# Patient Record
Sex: Female | Born: 1945 | Hispanic: Yes | Smoking: Never smoker
Health system: Southern US, Community
[De-identification: ages and names within clinical notes are randomized; demographics above are authoritative.]

## PROBLEM LIST (undated history)

## (undated) DIAGNOSIS — H9190 Unspecified hearing loss, unspecified ear: Secondary | ICD-10-CM

## (undated) DIAGNOSIS — G40909 Epilepsy, unspecified, not intractable, without status epilepticus: Secondary | ICD-10-CM

## (undated) DIAGNOSIS — M1990 Unspecified osteoarthritis, unspecified site: Secondary | ICD-10-CM

## (undated) DIAGNOSIS — I10 Essential (primary) hypertension: Secondary | ICD-10-CM

## (undated) DIAGNOSIS — M545 Low back pain, unspecified: Secondary | ICD-10-CM

## (undated) DIAGNOSIS — R519 Headache, unspecified: Secondary | ICD-10-CM

## (undated) DIAGNOSIS — E669 Obesity, unspecified: Secondary | ICD-10-CM

## (undated) DIAGNOSIS — E7800 Pure hypercholesterolemia, unspecified: Secondary | ICD-10-CM

## (undated) DIAGNOSIS — E119 Type 2 diabetes mellitus without complications: Secondary | ICD-10-CM

## (undated) DIAGNOSIS — R42 Dizziness and giddiness: Secondary | ICD-10-CM

## (undated) DIAGNOSIS — N289 Disorder of kidney and ureter, unspecified: Secondary | ICD-10-CM

## (undated) HISTORY — DX: Unspecified hearing loss, unspecified ear: H91.90

## (undated) HISTORY — DX: Low back pain, unspecified: M54.50

## (undated) HISTORY — DX: Epilepsy, unspecified, not intractable, without status epilepticus: G40.909

## (undated) HISTORY — PX: SHOULDER SURGERY: SHX246

## (undated) HISTORY — DX: Type 2 diabetes mellitus without complications: E11.9

## (undated) HISTORY — DX: Dizziness and giddiness: R42

## (undated) HISTORY — DX: Headache, unspecified: R51.9

## (undated) HISTORY — DX: Disorder of kidney and ureter, unspecified: N28.9

## (undated) HISTORY — DX: Unspecified osteoarthritis, unspecified site: M19.90

## (undated) HISTORY — PX: BACK SURGERY: SHX140

## (undated) HISTORY — PX: LAPAROSCOPIC HYSTERECTOMY: SHX1926

## (undated) HISTORY — DX: Essential (primary) hypertension: I10

## (undated) HISTORY — PX: HAND SURGERY: SHX662

## (undated) HISTORY — DX: Obesity, unspecified: E66.9

## (undated) HISTORY — DX: Pure hypercholesterolemia, unspecified: E78.00

---

## 1982-05-27 HISTORY — PX: BRAIN TUMOR EXCISION: SHX577

## 2019-11-12 ENCOUNTER — Telehealth: Payer: Self-pay

## 2019-11-12 NOTE — Telephone Encounter (Signed)
NOTES ON FILE FROM [facility] [phone]. SENT TO SCHEDULING.

## 2019-12-20 ENCOUNTER — Ambulatory Visit (INDEPENDENT_AMBULATORY_CARE_PROVIDER_SITE_OTHER): Payer: Medicare Other | Admitting: Cardiology

## 2019-12-20 ENCOUNTER — Encounter: Payer: Self-pay | Admitting: Cardiology

## 2019-12-20 ENCOUNTER — Other Ambulatory Visit: Payer: Self-pay

## 2019-12-20 ENCOUNTER — Encounter: Payer: Self-pay | Admitting: *Deleted

## 2019-12-20 VITALS — BP 150/62 | HR 61 | Ht 59.0 in | Wt 175.0 lb

## 2019-12-20 DIAGNOSIS — N182 Chronic kidney disease, stage 2 (mild): Secondary | ICD-10-CM

## 2019-12-20 DIAGNOSIS — I2510 Atherosclerotic heart disease of native coronary artery without angina pectoris: Secondary | ICD-10-CM

## 2019-12-20 DIAGNOSIS — E119 Type 2 diabetes mellitus without complications: Secondary | ICD-10-CM

## 2019-12-20 DIAGNOSIS — I10 Essential (primary) hypertension: Secondary | ICD-10-CM

## 2019-12-20 DIAGNOSIS — I209 Angina pectoris, unspecified: Secondary | ICD-10-CM | POA: Diagnosis not present

## 2019-12-20 DIAGNOSIS — R0600 Dyspnea, unspecified: Secondary | ICD-10-CM | POA: Diagnosis not present

## 2019-12-20 DIAGNOSIS — R0609 Other forms of dyspnea: Secondary | ICD-10-CM

## 2019-12-20 NOTE — Progress Notes (Signed)
Cardiology Office Note:    Date:  12/20/2019   ID:  [name], DOB [birth year], MRN [id number]  PCP:  Patient, No Pcp Per  [facility] Cardiologist:  [name], MD  [facility] Electrophysiologist:  None   Referring MD: [name], PA     History of Present Illness:    Carolyn Little is a 74 y.o. female here for the evaluation of hypertension at the request of Carolyn Little, [place].  Been feeling short of breath with activity.  This could be her prior anginal equivalent.  Also has low back pain headaches.  She was requesting at her primary care provider visit kidney specialist ear doctor and cardiologist. Saw in [place].   CAD with stent 2015. Since then some angina, hospital. Moderate SOB with activity.  Does not recall having a cardiac catheterization after her stent placement.  She takes combination hydrochlorothiazide valsartan 25 320.  Also takes amlodipine.  Denies any fevers chills nausea vomiting syncope bleeding.  She is here with her sister.  Spanish interpreter present.  TSH 2.5 LDL 122 triglycerides 173 hemoglobin A1c 8.6 creatinine 1.1 potassium 4.9 sodium 141  Past Medical History:  Diagnosis Date  . Arthritis   . Diabetes ([place])   . Dizziness   . DM (diabetes mellitus) ([place])   . Generalized headaches   . Hearing loss   . High cholesterol   . Hypertension   . Low back pain   . Obesity   . Renal insufficiency   . Seizure disorder ([place])       Current Medications: Current Meds  Medication Sig  . amLODipine (NORVASC) 5 MG tablet Take 5 mg by mouth daily.  [name] aspirin EC 81 MG tablet Take 81 mg by mouth daily. Swallow whole.  . brimonidine (ALPHAGAN) 0.2 % ophthalmic solution 3 (three) times daily.  . carvedilol (COREG) 25 MG tablet Take 25 mg by mouth 2 (two) times daily with a meal.  . dorzolamidel-timolol (COSOPT) 22.3-6.8 MG/ML SOLN ophthalmic solution 1 drop 2 (two) times daily.  . famotidine (PEPCID) 40 MG tablet Take 40 mg by mouth  daily.  . insulin degludec (TRESIBA FLEXTOUCH) 200 UNIT/ML FlexTouch Pen Inject into the skin.  [name] lamoTRIgine (LAMICTAL) 150 MG tablet Take 150 mg by mouth 2 (two) times daily.   [name] levETIRAcetam (KEPPRA) 500 MG tablet Take 500 mg by mouth 2 (two) times daily.  . meclizine (ANTIVERT) 25 MG tablet Take 25 mg by mouth 3 (three) times daily as needed for dizziness.  . simvastatin (ZOCOR) 40 MG tablet Take 40 mg by mouth daily.  . valsartan-hydrochlorothiazide (DIOVAN-HCT) 320-25 MG tablet Take 1 tablet by mouth daily.     Allergies:   Levaquin [levofloxacin]   Social History   Socioeconomic History  . Marital status: Single    Spouse name: Not on file  . Little of children: Not on file  . Years of education: Not on file  . Highest education level: Not on file  Occupational History  . Not on file  Tobacco Use  . Smoking status: Never Smoker  . Smokeless tobacco: Never Used  Substance and Sexual Activity  . Alcohol use: Not Currently  . Drug use: Never  . Sexual activity: Not on file  Other Topics Concern  . Not on file  Social History Narrative  . Not on file   Social Determinants of Health   Financial Resource Strain:   . Difficulty of Paying Living Expenses:   Food Insecurity:   .  Worried About Charity fundraiser in the Last Year:   . Arboriculturist in the Last Year:   Transportation Needs:   . Film/video editor (Medical):   [name] Lack of Transportation (Non-Medical):   Physical Activity:   . Days of Exercise per Week:   . Minutes of Exercise per Session:   Stress:   . Feeling of Stress :   Social Connections:   . Frequency of Communication with Friends and Family:   . Frequency of Social Gatherings with Friends and Family:   . Attends Religious Services:   . Active Member of Clubs or Organizations:   . Attends Archivist Meetings:   [name] Marital Status:      Family History: The patient's sister may have died from something heart related, grandfather  as well.  ROS:   Please see the history of present illness.     All other systems reviewed and are negative.  EKGs/Labs/Other Studies Reviewed:     EKG:  EKG is  ordered today.  The ekg ordered today demonstrates sinus rhythm 61 nonspecific ST-T wave changes noted, T wave inversion noted in V6 and V5, J-point elevation in V3.  Recent Labs: No results found for requested labs within last 8760 hours.  Recent Lipid Panel No results found for: CHOL, TRIG, HDL, CHOLHDL, VLDL, LDLCALC, LDLDIRECT  Physical Exam:    VS:  BP (!) 150/62   Pulse 61   Ht 4\' 11"  (1.499 m)   Wt 175 lb (79.4 kg)   BMI 35.35 kg/m     Wt Readings from Last 3 Encounters:  12/20/19 175 lb (79.4 kg)     GEN:  Well nourished, well developed in no acute distress HEENT: Normal NECK: No JVD; No carotid bruits LYMPHATICS: No lymphadenopathy CARDIAC: RRR, no murmurs, rubs, gallops RESPIRATORY:  Clear to auscultation without rales, wheezing or rhonchi  ABDOMEN: Soft, non-tender, non-distended MUSCULOSKELETAL:  No edema; No deformity  SKIN: Warm and dry NEUROLOGIC:  Alert and oriented x 3 PSYCHIATRIC:  Normal affect   ASSESSMENT:    1. Coronary artery disease involving native coronary artery of native heart without angina pectoris   2. Diabetes mellitus with coincident hypertension ([place])   3. Chronic kidney disease, stage II (mild)   4. Dyspnea on exertion   5. Angina pectoris ([place])    PLAN:    In order of problems listed above:  Coronary artery disease -Prior stent placement in approximately 2015.  She is currently having some increasing shortness of breath with activity, walking uphill.  We will go ahead and check an echocardiogram as well as Lexiscan stress test to ensure that this is not her angina returning.  She has not had a catheterization post 2015.  She has a stent card at home.  Her daughter who was present at today's visit would like to be called with results.  Abnormal EKG -T wave inversions  noted in V5 and V6 could be LVH repolarization abnormality.  Checking stress test, echocardiogram  Essential hypertension -Blood pressure was 114/65 at last office visit with Carolyn Number, PA on 11/12/2019.  Well-controlled.  Here was elevated.  Likely some degree of whitecoat hypertension.  Continue to monitor.  States that she takes amlodipine as needed when she feels her high blood pressure.  Mixed hyperlipidemia -Continuing with current treatment strategy.  No changes made.  On simvastatin 40 mg a day.  No myalgias.  Chronic kidney disease stage II -She was referred to nephrology.  She has seen nephrology in [place].  Diabetes with hypertension -Continuing per primary care.  Hemoglobin A1c still a little bit elevated.   Medication Adjustments/Labs and Tests Ordered: Current medicines are reviewed at length with the patient today.  Concerns regarding medicines are outlined above.  Orders Placed This Encounter  Procedures  . MYOCARDIAL PERFUSION IMAGING  . EKG 12-Lead  . ECHOCARDIOGRAM COMPLETE   No orders of the defined types were placed in this encounter.   Patient Instructions  Medication Instructions:  The current medical regimen is effective;  continue present plan and medications.  *If you need a refill on your cardiac medications before your next appointment, please call your pharmacy*  Testing/Procedures: Your physician has requested that you have an echocardiogram. Echocardiography is a painless test that uses sound waves to create images of your heart. It provides your doctor with information about the size and shape of your heart and how well your heart's chambers and valves are working. This procedure takes approximately one hour. There are no restrictions for this procedure.  Your physician has requested that you have a lexiscan myoview. For further information please visit [email or web address]. Please follow instruction sheet, as given.  Follow-Up: At [facility], you and your health needs are our priority.  As part of our continuing mission to provide you with exceptional heart care, we have created designated Provider Care Teams.  These Care Teams include your primary Cardiologist (physician) and Advanced Practice Providers (APPs -  Physician Assistants and Nurse Practitioners) who all work together to provide you with the care you need, when you need it.  We recommend signing up for the patient portal called "MyChart".  Sign up information is provided on this After Visit Summary.  MyChart is used to connect with patients for Virtual Visits (Telemedicine).  Patients are able to view lab/test results, encounter notes, upcoming appointments, etc.  Non-urgent messages can be sent to your provider as well.   To learn more about what you can do with MyChart, go to [email or web address].    Your next appointment:   12 month(s)  The format for your next appointment:   In Person  Provider:   [name], MD   Thank you for choosing [facility]!!         Signed, [name], MD  12/20/2019 11:17 AM    [place]

## 2019-12-20 NOTE — Patient Instructions (Addendum)
Medication Instructions:  The current medical regimen is effective;  continue present plan and medications.  *If you need a refill on your cardiac medications before your next appointment, please call your pharmacy*  Testing/Procedures: Your physician has requested that you have an echocardiogram. Echocardiography is a painless test that uses sound waves to create images of your heart. It provides your doctor with information about the size and shape of your heart and how well your heart's chambers and valves are working. This procedure takes approximately one hour. There are no restrictions for this procedure.  Your physician has requested that you have a lexiscan myoview. For further information please visit [email or web address]. Please follow instruction sheet, as given.  Follow-Up: At [facility], you and your health needs are our priority.  As part of our continuing mission to provide you with exceptional heart care, we have created designated Provider Care Teams.  These Care Teams include your primary Cardiologist (physician) and Advanced Practice Providers (APPs -  Physician Assistants and Nurse Practitioners) who all work together to provide you with the care you need, when you need it.  We recommend signing up for the patient portal called "MyChart".  Sign up information is provided on this After Visit Summary.  MyChart is used to connect with patients for Virtual Visits (Telemedicine).  Patients are able to view lab/test results, encounter notes, upcoming appointments, etc.  Non-urgent messages can be sent to your provider as well.   To learn more about what you can do with MyChart, go to [email or web address].    Your next appointment:   12 month(s)  The format for your next appointment:   In Person  Provider:   [name], MD   Thank you for choosing [facility]!!

## 2020-01-03 ENCOUNTER — Telehealth (HOSPITAL_COMMUNITY): Payer: Self-pay | Admitting: *Deleted

## 2020-01-03 NOTE — Telephone Encounter (Signed)
Via interpreter, left message on voicemail per DPR in reference to upcoming appointment scheduled on 01/07/20 at 10:15 with detailed instructions given per Myocardial Perfusion Study Information Sheet for the test. LM to arrive 15 minutes early, and that it is imperative to arrive on time for appointment to keep from having the test rescheduled. If you need to cancel or reschedule your appointment, please call the office within 24 hours of your appointment. Failure to do so may result in a cancellation of your appointment, and a $50 no show fee. Phone number given for call back for any questions.

## 2020-01-07 ENCOUNTER — Ambulatory Visit (HOSPITAL_BASED_OUTPATIENT_CLINIC_OR_DEPARTMENT_OTHER): Payer: Medicare Other

## 2020-01-07 ENCOUNTER — Other Ambulatory Visit: Payer: Self-pay

## 2020-01-07 ENCOUNTER — Ambulatory Visit (HOSPITAL_COMMUNITY): Payer: Medicare Other | Attending: Cardiology

## 2020-01-07 DIAGNOSIS — I209 Angina pectoris, unspecified: Secondary | ICD-10-CM | POA: Diagnosis present

## 2020-01-07 DIAGNOSIS — R0609 Other forms of dyspnea: Secondary | ICD-10-CM

## 2020-01-07 DIAGNOSIS — R0600 Dyspnea, unspecified: Secondary | ICD-10-CM

## 2020-01-07 LAB — MYOCARDIAL PERFUSION IMAGING
LV dias vol: 78 mL (ref 46–106)
LV sys vol: 32 mL
Peak HR: 65 {beats}/min
Rest HR: 55 {beats}/min
SDS: 0
SRS: 0
SSS: 0
TID: 1.16

## 2020-01-07 LAB — ECHOCARDIOGRAM COMPLETE
Area-P 1/2: 2.24 cm2
Height: 59 in
S' Lateral: 3 cm
Weight: 2800 oz

## 2020-01-07 MED ORDER — TECHNETIUM TC 99M TETROFOSMIN IV KIT
10.1000 | PACK | Freq: Once | INTRAVENOUS | Status: AC | PRN
  Administered 2020-01-07: 10.1 via INTRAVENOUS
  Filled 2020-01-07: qty 11

## 2020-01-07 MED ORDER — TECHNETIUM TC 99M TETROFOSMIN IV KIT
31.5000 | PACK | Freq: Once | INTRAVENOUS | Status: AC | PRN
  Administered 2020-01-07: 31.5 via INTRAVENOUS
  Filled 2020-01-07: qty 32

## 2020-01-07 MED ORDER — ADENOSINE (DIAGNOSTIC) 3 MG/ML IV SOLN
0.5600 mg/kg | Freq: Once | INTRAVENOUS | Status: AC
  Administered 2020-01-07: 44.4 mg via INTRAVENOUS

## 2020-01-07 NOTE — Progress Notes (Signed)
2D Echocardiogram has been completed.  Rut Betterton, RCS 

## 2020-01-13 ENCOUNTER — Ambulatory Visit: Payer: Medicare Other | Admitting: Neurology

## 2020-01-27 ENCOUNTER — Encounter: Payer: Self-pay | Admitting: *Deleted

## 2020-02-01 ENCOUNTER — Other Ambulatory Visit: Payer: Self-pay

## 2020-02-01 ENCOUNTER — Ambulatory Visit (INDEPENDENT_AMBULATORY_CARE_PROVIDER_SITE_OTHER): Payer: Medicare Other | Admitting: Diagnostic Neuroimaging

## 2020-02-01 ENCOUNTER — Encounter: Payer: Self-pay | Admitting: Diagnostic Neuroimaging

## 2020-02-01 VITALS — BP 180/75 | HR 69 | Ht 59.0 in | Wt 176.0 lb

## 2020-02-01 DIAGNOSIS — G40109 Localization-related (focal) (partial) symptomatic epilepsy and epileptic syndromes with simple partial seizures, not intractable, without status epilepticus: Secondary | ICD-10-CM | POA: Diagnosis not present

## 2020-02-01 MED ORDER — LEVETIRACETAM 500 MG PO TABS
500.0000 mg | ORAL_TABLET | Freq: Two times a day (BID) | ORAL | 4 refills | Status: DC
Start: 2020-02-01 — End: 2021-04-23

## 2020-02-01 MED ORDER — LAMOTRIGINE 150 MG PO TABS
150.0000 mg | ORAL_TABLET | Freq: Two times a day (BID) | ORAL | 4 refills | Status: DC
Start: 2020-02-01 — End: 2021-02-05

## 2020-02-01 NOTE — Patient Instructions (Signed)
  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

## 2020-02-01 NOTE — Progress Notes (Signed)
[facility]  PATIENT: Carolyn Little DOB: January 30, 1946  REFERRING CLINICIAN: [name], PA HISTORY FROM: patient and daughter (via interpreter) REASON FOR VISIT: new consult    HISTORICAL  CHIEF COMPLAINT:  Chief Complaint  Patient presents with  . Dizziness    rm 7 New Pt dgtr- Alleen Borne, interpreter- [place] "no dizziness; FU to tumor removal 1984, H/O seizures'    HISTORY OF PRESENT ILLNESS:   74 year old female here for evaluation of seizure disorder.  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  Around 2008 patient had onset of staring spells and was diagnosed with temporal lobe epilepsy.  No convulsive seizures.  She was started on lamotrigine and then Keppra was added on.  Since 2010 patient did well did not have any major seizures.  She may have had a breakthrough seizure in 2018 but unclear.  Since that time doing well on lamotrigine and Keppra.  Daughter notes some memory loss and forgetfulness for the past 1 year.  No change in ADLs.  Patient used to live in [place] and moved to [place] in June 2021.   REVIEW OF SYSTEMS: Full 14 system review of systems performed and negative with exception of: As per HPI.  ALLERGIES: Allergies  Allergen Reactions  . Levaquin [Levofloxacin] Hives    HOME MEDICATIONS: Outpatient Medications Prior to Visit  Medication Sig Dispense Refill  . amLODipine (NORVASC) 5 MG tablet Take 5 mg by mouth daily.    . brimonidine (ALPHAGAN) 0.2 % ophthalmic solution 3 (three) times daily.    . carvedilol (COREG) 25 MG tablet Take 25 mg by mouth 2 (two) times daily with a meal.    . [facility] ASPIRIN ADULT LOW DOSE 81 MG chewable tablet Chew 81 mg by mouth daily.    . diclofenac Sodium (VOLTAREN) 1 % GEL SMARTSIG:Gram(s) Topical 4 Times Daily    . dorzolamidel-timolol (COSOPT) 22.3-6.8 MG/ML SOLN ophthalmic solution 1  drop 2 (two) times daily.    . famotidine (PEPCID) 40 MG tablet Take 40 mg by mouth daily.    . insulin degludec (TRESIBA FLEXTOUCH) 200 UNIT/ML FlexTouch Pen Inject into the skin.    [name] lamoTRIgine (LAMICTAL) 150 MG tablet Take 150 mg by mouth 2 (two) times daily.     [name] levETIRAcetam (KEPPRA) 500 MG tablet Take 500 mg by mouth 2 (two) times daily.    . meclizine (ANTIVERT) 25 MG tablet Take 25 mg by mouth 3 (three) times daily as needed for dizziness.    . metFORMIN (GLUCOPHAGE) 500 MG tablet Take 500 mg by mouth 2 (two) times daily.    . simvastatin (ZOCOR) 40 MG tablet Take 40 mg by mouth daily.    . valsartan-hydrochlorothiazide (DIOVAN-HCT) 320-25 MG tablet Take 1 tablet by mouth daily.    . Vitamin D, Ergocalciferol, (DRISDOL) 1.25 MG (50000 UNIT) CAPS capsule Take 50,000 Units by mouth once a week.    [name] aspirin EC 81 MG tablet Take 81 mg by mouth daily. Swallow whole.     No facility-administered medications prior to visit.    PAST MEDICAL HISTORY: Past Medical History:  Diagnosis Date  . Arthritis   . Dizziness   . DM (diabetes mellitus) ([place])   . Generalized headaches   . Hearing loss   . High cholesterol   . Hypertension   . Low back pain   . Obesity   . Renal insufficiency   .  Seizure disorder ([place])     PAST SURGICAL HISTORY: Past Surgical History:  Procedure Laterality Date  . BACK SURGERY    . BRAIN TUMOR EXCISION  1984  . HAND SURGERY    . LAPAROSCOPIC HYSTERECTOMY    . SHOULDER SURGERY      FAMILY HISTORY: No family history on file.  SOCIAL HISTORY: Social History   Socioeconomic History  . Marital status: Widowed    Spouse name: Not on file  . Number of children: 4  . Years of education: Not on file  . Highest education level: Not on file  Occupational History  . Not on file  Tobacco Use  . Smoking status: Never Smoker  . Smokeless tobacco: Never Used  Substance and Sexual Activity  . Alcohol use: Not Currently  . Drug use: Never  . Sexual  activity: Not on file  Other Topics Concern  . Not on file  Social History Narrative   Lives alone   Social Determinants of Health   Financial Resource Strain:   . Difficulty of Paying Living Expenses: Not on file  Food Insecurity:   . Worried About Charity fundraiser in the Last Year: Not on file  . Ran Out of Food in the Last Year: Not on file  Transportation Needs:   . Lack of Transportation (Medical): Not on file  . Lack of Transportation (Non-Medical): Not on file  Physical Activity:   . Days of Exercise per Week: Not on file  . Minutes of Exercise per Session: Not on file  Stress:   . Feeling of Stress : Not on file  Social Connections:   . Frequency of Communication with Friends and Family: Not on file  . Frequency of Social Gatherings with Friends and Family: Not on file  . Attends Religious Services: Not on file  . Active Member of Clubs or Organizations: Not on file  . Attends Archivist Meetings: Not on file  . Marital Status: Not on file  Intimate Partner Violence:   . Fear of Current or Ex-Partner: Not on file  . Emotionally Abused: Not on file  . Physically Abused: Not on file  . Sexually Abused: Not on file     PHYSICAL EXAM  GENERAL EXAM/CONSTITUTIONAL: Vitals:  Vitals:   02/01/20 1419  BP: (!) 180/75  Pulse: 69  Weight: 176 lb (79.8 kg)  Height: 4\' 11"  (1.499 m)     Body mass index is 35.55 kg/m. Wt Readings from Last 3 Encounters:  02/01/20 176 lb (79.8 kg)  01/07/20 175 lb (79.4 kg)  12/20/19 175 lb (79.4 kg)     Patient is in no distress; well developed, nourished and groomed; neck is supple  CARDIOVASCULAR:  Examination of carotid arteries is normal; no carotid bruits  Regular rate and rhythm, no murmurs  Examination of peripheral vascular system by observation and palpation is normal  EYES:  Ophthalmoscopic exam of optic discs and posterior segments is normal; no papilledema or hemorrhages  No exam data  present  MUSCULOSKELETAL:  Gait, strength, tone, movements noted in Neurologic exam below  NEUROLOGIC: MENTAL STATUS:  No flowsheet data found.  awake, alert, oriented to person, place and time  recent and remote memory intact  normal attention and concentration  language fluent, comprehension intact, naming intact  fund of knowledge appropriate  CRANIAL NERVE:   2nd - no papilledema on fundoscopic exam  2nd, 3rd, 4th, 6th - pupils equal and reactive to light, visual fields full to  confrontation, extraocular muscles intact, no nystagmus  5th - facial sensation symmetric  7th - facial strength --> LEFT UPPER AND LEFT LOWER FACIAL WEAKNESS  8th - hearing --> DECR IN LEFT  9th - palate elevates symmetrically, uvula midline  11th - shoulder shrug symmetric  12th - tongue protrusion midline  MOTOR:   normal bulk and tone, full strength in the BUE, BLE  SENSORY:   normal and symmetric to light touch  COORDINATION:   finger-nose-finger, fine finger movements normal  REFLEXES:   deep tendon reflexes 1+ and symmetric  GAIT/STATION:   narrow based gait     DIAGNOSTIC DATA (LABS, IMAGING, TESTING) - I reviewed patient records, labs, notes, testing and imaging myself where available.  No results found for: WBC, HGB, HCT, MCV, PLT No results found for: NA, K, CL, CO2, GLUCOSE, BUN, CREATININE, CALCIUM, PROT, ALBUMIN, AST, ALT, ALKPHOS, BILITOT, GFRNONAA, GFRAA No results found for: CHOL, HDL, LDLCALC, LDLDIRECT, TRIG, CHOLHDL No results found for: HGBA1C No results found for: VITAMINB12 No results found for: TSH     ASSESSMENT AND PLAN  74 y.o. year old female here with history of seizures since 2008, with last seizures in 2010 and possibly 2018.  Stable on medication.  Also history of left acoustic neuroma status post resection 1984, stable.  Dx:  1. 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 lamoTRIgine (LAMICTAL) 150 MG tablet    Sig: Take 1 tablet (150 mg total) by mouth 2 (two) times daily.    Dispense:  180 tablet    Refill:  4  . levETIRAcetam (KEPPRA) 500 MG tablet    Sig: Take 1 tablet (500 mg total) by mouth 2 (two) times daily.    Dispense:  180 tablet    Refill:  4   Return in about 1 year (around 01/31/2021) for with NP (Amy Lomax).    Penni Bombard, MD 07/05/9240, 6:83 PM Certified in Neurology, Neurophysiology and Neuroimaging  [facility] [address] [place], [place] [address] [phone]

## 2020-02-09 DIAGNOSIS — E1122 Type 2 diabetes mellitus with diabetic chronic kidney disease: Secondary | ICD-10-CM | POA: Diagnosis not present

## 2020-02-09 DIAGNOSIS — I129 Hypertensive chronic kidney disease with stage 1 through stage 4 chronic kidney disease, or unspecified chronic kidney disease: Secondary | ICD-10-CM | POA: Diagnosis not present

## 2020-02-09 DIAGNOSIS — E785 Hyperlipidemia, unspecified: Secondary | ICD-10-CM | POA: Diagnosis not present

## 2020-02-09 DIAGNOSIS — N1831 Chronic kidney disease, stage 3a: Secondary | ICD-10-CM | POA: Diagnosis not present

## 2020-02-11 DIAGNOSIS — I10 Essential (primary) hypertension: Secondary | ICD-10-CM | POA: Diagnosis not present

## 2020-02-11 DIAGNOSIS — E119 Type 2 diabetes mellitus without complications: Secondary | ICD-10-CM | POA: Diagnosis not present

## 2020-02-11 DIAGNOSIS — R42 Dizziness and giddiness: Secondary | ICD-10-CM | POA: Diagnosis not present

## 2020-02-11 DIAGNOSIS — E782 Mixed hyperlipidemia: Secondary | ICD-10-CM | POA: Diagnosis not present

## 2020-03-03 DIAGNOSIS — E782 Mixed hyperlipidemia: Secondary | ICD-10-CM | POA: Diagnosis not present

## 2020-03-03 DIAGNOSIS — R42 Dizziness and giddiness: Secondary | ICD-10-CM | POA: Diagnosis not present

## 2020-03-03 DIAGNOSIS — E119 Type 2 diabetes mellitus without complications: Secondary | ICD-10-CM | POA: Diagnosis not present

## 2020-03-03 DIAGNOSIS — I10 Essential (primary) hypertension: Secondary | ICD-10-CM | POA: Diagnosis not present

## 2020-03-17 DIAGNOSIS — E11649 Type 2 diabetes mellitus with hypoglycemia without coma: Secondary | ICD-10-CM | POA: Diagnosis not present

## 2020-03-17 DIAGNOSIS — I10 Essential (primary) hypertension: Secondary | ICD-10-CM | POA: Diagnosis not present

## 2020-03-17 DIAGNOSIS — E1169 Type 2 diabetes mellitus with other specified complication: Secondary | ICD-10-CM | POA: Diagnosis not present

## 2020-03-17 DIAGNOSIS — E1159 Type 2 diabetes mellitus with other circulatory complications: Secondary | ICD-10-CM | POA: Diagnosis not present

## 2020-03-17 DIAGNOSIS — E785 Hyperlipidemia, unspecified: Secondary | ICD-10-CM | POA: Diagnosis not present

## 2020-03-24 DIAGNOSIS — E782 Mixed hyperlipidemia: Secondary | ICD-10-CM | POA: Diagnosis not present

## 2020-03-24 DIAGNOSIS — E119 Type 2 diabetes mellitus without complications: Secondary | ICD-10-CM | POA: Diagnosis not present

## 2020-03-24 DIAGNOSIS — R42 Dizziness and giddiness: Secondary | ICD-10-CM | POA: Diagnosis not present

## 2020-03-24 DIAGNOSIS — I10 Essential (primary) hypertension: Secondary | ICD-10-CM | POA: Diagnosis not present

## 2020-04-03 DIAGNOSIS — E559 Vitamin D deficiency, unspecified: Secondary | ICD-10-CM | POA: Diagnosis not present

## 2020-04-03 DIAGNOSIS — I10 Essential (primary) hypertension: Secondary | ICD-10-CM | POA: Diagnosis not present

## 2020-04-03 DIAGNOSIS — E11649 Type 2 diabetes mellitus with hypoglycemia without coma: Secondary | ICD-10-CM | POA: Diagnosis not present

## 2020-04-03 DIAGNOSIS — E785 Hyperlipidemia, unspecified: Secondary | ICD-10-CM | POA: Diagnosis not present

## 2020-04-03 DIAGNOSIS — Z794 Long term (current) use of insulin: Secondary | ICD-10-CM | POA: Diagnosis not present

## 2020-04-13 DIAGNOSIS — D333 Benign neoplasm of cranial nerves: Secondary | ICD-10-CM | POA: Diagnosis not present

## 2020-04-13 DIAGNOSIS — R42 Dizziness and giddiness: Secondary | ICD-10-CM | POA: Diagnosis not present

## 2020-04-13 DIAGNOSIS — H903 Sensorineural hearing loss, bilateral: Secondary | ICD-10-CM | POA: Diagnosis not present

## 2020-05-04 ENCOUNTER — Telehealth: Payer: Self-pay | Admitting: Cardiology

## 2020-05-04 MED ORDER — VALSARTAN-HYDROCHLOROTHIAZIDE 320-25 MG PO TABS: 1.0000 | ORAL_TABLET | Freq: Every day | ORAL | 2 refills | Status: DC

## 2020-05-04 MED ORDER — SIMVASTATIN 40 MG PO TABS
40.0000 mg | ORAL_TABLET | Freq: Every day | ORAL | 3 refills | Status: DC
Start: 2020-05-04 — End: 2021-04-30

## 2020-05-04 NOTE — Telephone Encounter (Signed)
Pt's medications were sent to pt's pharmacy as requested. Confirmation received.  

## 2020-05-04 NOTE — Telephone Encounter (Signed)
*  STAT* If patient is at the pharmacy, call can be transferred to refill team.   1. Which medications need to be refilled? (please list name of each medication and dose if known)  valsartan-hydrochlorothiazide (DIOVAN-HCT) 320-25 MG tablet simvastatin (ZOCOR) 40 MG tablet  2. Which pharmacy/location (including street and city if local pharmacy) is medication to be sent to? [id number] [place] - [address] AT [place]  3. Do they need a 30 day or 90 day supply? 90 day   Patient is out of medications

## 2020-05-09 ENCOUNTER — Telehealth: Payer: Self-pay

## 2020-05-09 NOTE — Telephone Encounter (Signed)
Pt's daughter is calling stating that pt's medication was sent in wrong, pt takes valsartan-hydrochlorothiazide 325-12.5 mg tablets. Pt's daughter would like a call back concerning this matter. Please address

## 2020-05-10 NOTE — Telephone Encounter (Signed)
Patient's daughter [facility]) stated patient has been on Diovan 320/12.5 mg daily, not 320/25 mg . Will send to Dr. Marlou Porch to see if the lower dose of HCTZ can be sent in. Patient's original prescription was from a doctor in [place].

## 2020-05-11 NOTE — Telephone Encounter (Signed)
I am fine with this change Diovan 320/12.5mg   [name], MD

## 2020-05-12 DIAGNOSIS — E119 Type 2 diabetes mellitus without complications: Secondary | ICD-10-CM | POA: Diagnosis not present

## 2020-05-12 DIAGNOSIS — E782 Mixed hyperlipidemia: Secondary | ICD-10-CM | POA: Diagnosis not present

## 2020-05-12 DIAGNOSIS — I10 Essential (primary) hypertension: Secondary | ICD-10-CM | POA: Diagnosis not present

## 2020-05-12 DIAGNOSIS — R42 Dizziness and giddiness: Secondary | ICD-10-CM | POA: Diagnosis not present

## 2020-05-12 MED ORDER — VALSARTAN-HYDROCHLOROTHIAZIDE 320-12.5 MG PO TABS: 1.0000 | ORAL_TABLET | Freq: Every day | ORAL | 3 refills | Status: DC

## 2020-05-12 NOTE — Telephone Encounter (Signed)
Corrected dose if Diovan per Dr [name] order.  New RX sent into pharmacy on file.

## 2020-05-12 NOTE — Telephone Encounter (Signed)
Daughter Alleen Borne is aware corrected [facility] has [facility].  She thanked me for the call.

## 2020-05-17 DIAGNOSIS — E11649 Type 2 diabetes mellitus with hypoglycemia without coma: Secondary | ICD-10-CM | POA: Diagnosis not present

## 2020-05-22 DIAGNOSIS — H538 Other visual disturbances: Secondary | ICD-10-CM | POA: Diagnosis not present

## 2020-05-22 DIAGNOSIS — Z961 Presence of intraocular lens: Secondary | ICD-10-CM | POA: Diagnosis not present

## 2020-05-22 DIAGNOSIS — H169 Unspecified keratitis: Secondary | ICD-10-CM | POA: Diagnosis not present

## 2020-05-22 DIAGNOSIS — H04121 Dry eye syndrome of right lacrimal gland: Secondary | ICD-10-CM | POA: Diagnosis not present

## 2020-06-13 DIAGNOSIS — H5213 Myopia, bilateral: Secondary | ICD-10-CM | POA: Diagnosis not present

## 2020-07-03 DIAGNOSIS — E782 Mixed hyperlipidemia: Secondary | ICD-10-CM | POA: Diagnosis not present

## 2020-07-03 DIAGNOSIS — R42 Dizziness and giddiness: Secondary | ICD-10-CM | POA: Diagnosis not present

## 2020-07-03 DIAGNOSIS — E559 Vitamin D deficiency, unspecified: Secondary | ICD-10-CM | POA: Diagnosis not present

## 2020-07-03 DIAGNOSIS — N289 Disorder of kidney and ureter, unspecified: Secondary | ICD-10-CM | POA: Diagnosis not present

## 2020-07-03 DIAGNOSIS — I10 Essential (primary) hypertension: Secondary | ICD-10-CM | POA: Diagnosis not present

## 2020-07-03 DIAGNOSIS — M19011 Primary osteoarthritis, right shoulder: Secondary | ICD-10-CM | POA: Diagnosis not present

## 2020-07-03 DIAGNOSIS — Z794 Long term (current) use of insulin: Secondary | ICD-10-CM | POA: Diagnosis not present

## 2020-07-03 DIAGNOSIS — E119 Type 2 diabetes mellitus without complications: Secondary | ICD-10-CM | POA: Diagnosis not present

## 2020-07-10 DIAGNOSIS — E1169 Type 2 diabetes mellitus with other specified complication: Secondary | ICD-10-CM | POA: Diagnosis not present

## 2020-07-10 DIAGNOSIS — I10 Essential (primary) hypertension: Secondary | ICD-10-CM | POA: Diagnosis not present

## 2020-07-10 DIAGNOSIS — Z79899 Other long term (current) drug therapy: Secondary | ICD-10-CM | POA: Diagnosis not present

## 2020-07-10 DIAGNOSIS — E785 Hyperlipidemia, unspecified: Secondary | ICD-10-CM | POA: Diagnosis not present

## 2020-07-10 DIAGNOSIS — E11649 Type 2 diabetes mellitus with hypoglycemia without coma: Secondary | ICD-10-CM | POA: Diagnosis not present

## 2020-07-10 DIAGNOSIS — E039 Hypothyroidism, unspecified: Secondary | ICD-10-CM | POA: Diagnosis not present

## 2020-07-24 DIAGNOSIS — G510 Bell's palsy: Secondary | ICD-10-CM | POA: Diagnosis not present

## 2020-07-24 DIAGNOSIS — H169 Unspecified keratitis: Secondary | ICD-10-CM | POA: Diagnosis not present

## 2020-07-28 DIAGNOSIS — H5213 Myopia, bilateral: Secondary | ICD-10-CM | POA: Diagnosis not present

## 2020-07-28 DIAGNOSIS — H52223 Regular astigmatism, bilateral: Secondary | ICD-10-CM | POA: Diagnosis not present

## 2020-08-02 DIAGNOSIS — E1165 Type 2 diabetes mellitus with hyperglycemia: Secondary | ICD-10-CM | POA: Diagnosis not present

## 2020-09-11 DIAGNOSIS — I10 Essential (primary) hypertension: Secondary | ICD-10-CM | POA: Diagnosis not present

## 2020-09-11 DIAGNOSIS — E782 Mixed hyperlipidemia: Secondary | ICD-10-CM | POA: Diagnosis not present

## 2020-09-11 DIAGNOSIS — Z79899 Other long term (current) drug therapy: Secondary | ICD-10-CM | POA: Diagnosis not present

## 2020-09-11 DIAGNOSIS — E063 Autoimmune thyroiditis: Secondary | ICD-10-CM | POA: Diagnosis not present

## 2020-09-11 DIAGNOSIS — E1159 Type 2 diabetes mellitus with other circulatory complications: Secondary | ICD-10-CM | POA: Diagnosis not present

## 2020-09-11 DIAGNOSIS — E038 Other specified hypothyroidism: Secondary | ICD-10-CM | POA: Diagnosis not present

## 2020-09-11 DIAGNOSIS — E11649 Type 2 diabetes mellitus with hypoglycemia without coma: Secondary | ICD-10-CM | POA: Diagnosis not present

## 2020-09-11 DIAGNOSIS — Z794 Long term (current) use of insulin: Secondary | ICD-10-CM | POA: Diagnosis not present

## 2020-09-11 DIAGNOSIS — Z7984 Long term (current) use of oral hypoglycemic drugs: Secondary | ICD-10-CM | POA: Diagnosis not present

## 2020-09-11 DIAGNOSIS — E785 Hyperlipidemia, unspecified: Secondary | ICD-10-CM | POA: Diagnosis not present

## 2020-09-11 DIAGNOSIS — E039 Hypothyroidism, unspecified: Secondary | ICD-10-CM | POA: Diagnosis not present

## 2020-10-09 DIAGNOSIS — E11649 Type 2 diabetes mellitus with hypoglycemia without coma: Secondary | ICD-10-CM | POA: Diagnosis not present

## 2020-10-12 DIAGNOSIS — D333 Benign neoplasm of cranial nerves: Secondary | ICD-10-CM | POA: Diagnosis not present

## 2020-10-12 DIAGNOSIS — H903 Sensorineural hearing loss, bilateral: Secondary | ICD-10-CM | POA: Diagnosis not present

## 2020-10-20 DIAGNOSIS — E785 Hyperlipidemia, unspecified: Secondary | ICD-10-CM | POA: Diagnosis not present

## 2020-10-20 DIAGNOSIS — E1122 Type 2 diabetes mellitus with diabetic chronic kidney disease: Secondary | ICD-10-CM | POA: Diagnosis not present

## 2020-10-20 DIAGNOSIS — N1831 Chronic kidney disease, stage 3a: Secondary | ICD-10-CM | POA: Diagnosis not present

## 2020-10-20 DIAGNOSIS — I129 Hypertensive chronic kidney disease with stage 1 through stage 4 chronic kidney disease, or unspecified chronic kidney disease: Secondary | ICD-10-CM | POA: Diagnosis not present

## 2020-12-12 DIAGNOSIS — E11649 Type 2 diabetes mellitus with hypoglycemia without coma: Secondary | ICD-10-CM | POA: Diagnosis not present

## 2020-12-13 DIAGNOSIS — Z8673 Personal history of transient ischemic attack (TIA), and cerebral infarction without residual deficits: Secondary | ICD-10-CM | POA: Diagnosis not present

## 2020-12-13 DIAGNOSIS — E119 Type 2 diabetes mellitus without complications: Secondary | ICD-10-CM | POA: Diagnosis not present

## 2020-12-13 DIAGNOSIS — I2510 Atherosclerotic heart disease of native coronary artery without angina pectoris: Secondary | ICD-10-CM | POA: Diagnosis not present

## 2020-12-13 DIAGNOSIS — N289 Disorder of kidney and ureter, unspecified: Secondary | ICD-10-CM | POA: Diagnosis not present

## 2020-12-13 DIAGNOSIS — E785 Hyperlipidemia, unspecified: Secondary | ICD-10-CM | POA: Diagnosis not present

## 2020-12-13 DIAGNOSIS — Z794 Long term (current) use of insulin: Secondary | ICD-10-CM | POA: Diagnosis not present

## 2020-12-13 DIAGNOSIS — Z79899 Other long term (current) drug therapy: Secondary | ICD-10-CM | POA: Diagnosis not present

## 2020-12-13 DIAGNOSIS — M19011 Primary osteoarthritis, right shoulder: Secondary | ICD-10-CM | POA: Diagnosis not present

## 2020-12-13 DIAGNOSIS — E1159 Type 2 diabetes mellitus with other circulatory complications: Secondary | ICD-10-CM | POA: Diagnosis not present

## 2020-12-13 DIAGNOSIS — E782 Mixed hyperlipidemia: Secondary | ICD-10-CM | POA: Diagnosis not present

## 2020-12-13 DIAGNOSIS — R42 Dizziness and giddiness: Secondary | ICD-10-CM | POA: Diagnosis not present

## 2020-12-13 DIAGNOSIS — E559 Vitamin D deficiency, unspecified: Secondary | ICD-10-CM | POA: Diagnosis not present

## 2020-12-13 DIAGNOSIS — Z7984 Long term (current) use of oral hypoglycemic drugs: Secondary | ICD-10-CM | POA: Diagnosis not present

## 2020-12-13 DIAGNOSIS — I10 Essential (primary) hypertension: Secondary | ICD-10-CM | POA: Diagnosis not present

## 2020-12-13 DIAGNOSIS — Z0001 Encounter for general adult medical examination with abnormal findings: Secondary | ICD-10-CM | POA: Diagnosis not present

## 2020-12-13 DIAGNOSIS — E1169 Type 2 diabetes mellitus with other specified complication: Secondary | ICD-10-CM | POA: Diagnosis not present

## 2020-12-13 DIAGNOSIS — E1140 Type 2 diabetes mellitus with diabetic neuropathy, unspecified: Secondary | ICD-10-CM | POA: Diagnosis not present

## 2021-01-08 DIAGNOSIS — E11649 Type 2 diabetes mellitus with hypoglycemia without coma: Secondary | ICD-10-CM | POA: Diagnosis not present

## 2021-01-23 DIAGNOSIS — H43812 Vitreous degeneration, left eye: Secondary | ICD-10-CM | POA: Diagnosis not present

## 2021-01-23 DIAGNOSIS — G510 Bell's palsy: Secondary | ICD-10-CM | POA: Diagnosis not present

## 2021-01-23 DIAGNOSIS — H169 Unspecified keratitis: Secondary | ICD-10-CM | POA: Diagnosis not present

## 2021-01-23 DIAGNOSIS — H538 Other visual disturbances: Secondary | ICD-10-CM | POA: Diagnosis not present

## 2021-01-31 DIAGNOSIS — Z794 Long term (current) use of insulin: Secondary | ICD-10-CM | POA: Diagnosis not present

## 2021-01-31 DIAGNOSIS — E782 Mixed hyperlipidemia: Secondary | ICD-10-CM | POA: Diagnosis not present

## 2021-01-31 DIAGNOSIS — E559 Vitamin D deficiency, unspecified: Secondary | ICD-10-CM | POA: Diagnosis not present

## 2021-01-31 DIAGNOSIS — E1165 Type 2 diabetes mellitus with hyperglycemia: Secondary | ICD-10-CM | POA: Diagnosis not present

## 2021-01-31 DIAGNOSIS — I10 Essential (primary) hypertension: Secondary | ICD-10-CM | POA: Diagnosis not present

## 2021-01-31 DIAGNOSIS — Z0001 Encounter for general adult medical examination with abnormal findings: Secondary | ICD-10-CM | POA: Diagnosis not present

## 2021-02-03 ENCOUNTER — Other Ambulatory Visit: Payer: Self-pay | Admitting: Diagnostic Neuroimaging

## 2021-02-05 ENCOUNTER — Ambulatory Visit: Payer: Medicare Other | Admitting: Family Medicine

## 2021-02-06 DIAGNOSIS — I10 Essential (primary) hypertension: Secondary | ICD-10-CM | POA: Diagnosis not present

## 2021-02-13 DIAGNOSIS — E11649 Type 2 diabetes mellitus with hypoglycemia without coma: Secondary | ICD-10-CM | POA: Diagnosis not present

## 2021-03-16 ENCOUNTER — Telehealth: Payer: Self-pay | Admitting: Cardiology

## 2021-03-16 DIAGNOSIS — Z7985 Long-term (current) use of injectable non-insulin antidiabetic drugs: Secondary | ICD-10-CM | POA: Diagnosis not present

## 2021-03-16 DIAGNOSIS — E1140 Type 2 diabetes mellitus with diabetic neuropathy, unspecified: Secondary | ICD-10-CM | POA: Diagnosis not present

## 2021-03-16 DIAGNOSIS — I10 Essential (primary) hypertension: Secondary | ICD-10-CM | POA: Diagnosis not present

## 2021-03-16 DIAGNOSIS — E782 Mixed hyperlipidemia: Secondary | ICD-10-CM | POA: Diagnosis not present

## 2021-03-16 DIAGNOSIS — E1165 Type 2 diabetes mellitus with hyperglycemia: Secondary | ICD-10-CM | POA: Diagnosis not present

## 2021-03-16 DIAGNOSIS — Z794 Long term (current) use of insulin: Secondary | ICD-10-CM | POA: Diagnosis not present

## 2021-03-16 DIAGNOSIS — Z7984 Long term (current) use of oral hypoglycemic drugs: Secondary | ICD-10-CM | POA: Diagnosis not present

## 2021-03-16 DIAGNOSIS — E1136 Type 2 diabetes mellitus with diabetic cataract: Secondary | ICD-10-CM | POA: Diagnosis not present

## 2021-03-16 DIAGNOSIS — E1169 Type 2 diabetes mellitus with other specified complication: Secondary | ICD-10-CM | POA: Diagnosis not present

## 2021-03-16 DIAGNOSIS — E1121 Type 2 diabetes mellitus with diabetic nephropathy: Secondary | ICD-10-CM | POA: Diagnosis not present

## 2021-03-16 NOTE — Telephone Encounter (Signed)
Spoke with daughter who is reporting pt's BP has been all over the place lately and she was told to f/u with her cardiologist.  Daughter could not tell me what medication pt takes now other than Carvedilol.  She has not been seen by Dr Marlou Porch since 11/2019.  Was advised then to f/u in 1 year.  Appt scheduled with Dr Marlou Porch on Monday at the [facility] location.  Advised daughter to bring pt's medications and blood pressure reading.  Daughter states understanding.

## 2021-03-16 NOTE — Telephone Encounter (Signed)
Pt c/o BP issue: STAT if pt c/o blurred vision, one-sided weakness or slurred speech  1. What are your last 5 BP readings?  Daughter has it at home. She does not have it now  2. Are you having any other symptoms (ex. Dizziness, headache, blurred vision, passed out)? Headache  3. What is your BP issue? Daughter said that the patient's BP has been going up and down a lot. She does not know what to do to get it under control

## 2021-03-19 ENCOUNTER — Ambulatory Visit (INDEPENDENT_AMBULATORY_CARE_PROVIDER_SITE_OTHER): Payer: Medicare Other | Admitting: Cardiology

## 2021-03-19 ENCOUNTER — Other Ambulatory Visit: Payer: Self-pay

## 2021-03-19 ENCOUNTER — Encounter (HOSPITAL_BASED_OUTPATIENT_CLINIC_OR_DEPARTMENT_OTHER): Payer: Self-pay | Admitting: Cardiology

## 2021-03-19 DIAGNOSIS — I10 Essential (primary) hypertension: Secondary | ICD-10-CM | POA: Diagnosis not present

## 2021-03-19 DIAGNOSIS — E782 Mixed hyperlipidemia: Secondary | ICD-10-CM

## 2021-03-19 DIAGNOSIS — R9431 Abnormal electrocardiogram [ECG] [EKG]: Secondary | ICD-10-CM | POA: Diagnosis not present

## 2021-03-19 DIAGNOSIS — I2510 Atherosclerotic heart disease of native coronary artery without angina pectoris: Secondary | ICD-10-CM | POA: Diagnosis not present

## 2021-03-19 DIAGNOSIS — E119 Type 2 diabetes mellitus without complications: Secondary | ICD-10-CM | POA: Diagnosis not present

## 2021-03-19 NOTE — Assessment & Plan Note (Signed)
Continuing with current treatment strategy.  No changes made.  On simvastatin 40 mg a day.  No myalgias.

## 2021-03-19 NOTE — Assessment & Plan Note (Signed)
T wave inferior laterally unchanged from prior.

## 2021-03-19 NOTE — Assessment & Plan Note (Addendum)
Prior stent placement in approximately 2015. 2021 NUC Normal resting and stress perfusion. No ischemia or infarction EF 59%.  Her chest/arm/abdominal/leg pain is fairly atypical and likely noncardiac.  Continue to monitor.

## 2021-03-19 NOTE — Progress Notes (Signed)
Cardiology Office Note:    Date:  03/19/2021   ID:  Carolyn Little, DOB [birth year], MRN [id number]  PCP:  Benito Mccreedy, MD  [facility] Cardiologist:  [name], MD  [facility] Electrophysiologist:  None   Referring MD: Benito Mccreedy, MD    History of Present Illness:    Carolyn Little is a 75 y.o. female here for the evaluation of uncontrolled hypertension.  At her initial appointment, she reported moderate SOB with activity. This could be her prior anginal equivalent.  Also had low back pain and headaches.  She was requesting at her primary care provider visit kidney specialist ear doctor and cardiologist. [place].   CAD with stent 2015. Since then some angina, hospital. Does not recall having a cardiac catheterization after her stent placement.  She takes combination hydrochlorothiazide valsartan 25 320.  Also takes amlodipine.  10/29/2019: TSH 2.5 LDL 122 triglycerides 173 hemoglobin A1c 8.6 creatinine 1.1 potassium 4.9 sodium 141  Today: She is here with her sister who assists with interpretation.   She presents a blood pressure log today. At home her blood pressure seems to be fluctuating recently.  Since her last visit she continues to suffer from occasional headaches.   In the past few weeks, she has developed a little pain in her central chest and left shoulder. Her shoulder pain radiated to her left UE, and she describes having muscular pain "as if she had exercised." Yesterday she developed a tightness in her abdomen. A few times, her vision suddenly "blacked out" and she temporarily lost her vision.  Additionally, she complains of left LE numbness, especially in her feet. She also noticed some swelling in her bilateral feet.  She denies any palpitations, or shortness of breath. No lightheadedness, syncope, orthopnea, or PND.    Past Medical History:  Diagnosis Date   Arthritis    Dizziness    DM (diabetes mellitus) (HCC)    Generalized  headaches    Hearing loss    High cholesterol    Hypertension    Low back pain    Obesity    Renal insufficiency    Seizure disorder (HCC)       Current Medications: Current Meds  Medication Sig   ACCU-CHEK GUIDE test strip as directed as directed   atorvastatin (LIPITOR) 40 MG tablet Take 40 mg by mouth daily.   Blood Glucose Monitoring Suppl (ACCU-CHEK GUIDE) w/Device KIT    Blood Glucose Monitoring Suppl (GLUCOCOM BLOOD GLUCOSE MONITOR) DEVI 1 Device by Misc.(Non-Drug; Combo Route) route as needed (to check blood glucose).   Blood Glucose Monitoring Suppl (ONETOUCH VERIO FLEX SYSTEM) w/Device KIT 1 DEVICE BY MISC.(NON-DRUG COMBO ROUTE) ROUTE AS NEEDED (TO CHECK BLOOD GLUCOSE).   brimonidine (ALPHAGAN) 0.2 % ophthalmic solution 3 (three) times daily.   carvedilol (COREG) 25 MG tablet Take 25 mg by mouth 2 (two) times daily with a meal.   Continuous Blood Gluc Receiver (DEXCOM G6 RECEIVER) DEVI Use as directed for continuous glucose monitoring.   Continuous Blood Gluc Sensor (DEXCOM G6 SENSOR) MISC Inject 1 sensor to the skin every 10 days for continuous glucose monitoring.   Continuous Blood Gluc Transmit (DEXCOM G6 TRANSMITTER) MISC Use as directed for continuous glucose monitoring. Reuse transmitter for 90 days then discard and replace.   [employer] ASPIRIN ADULT LOW DOSE 81 MG chewable tablet Chew 81 mg by mouth daily.   diclofenac Sodium (VOLTAREN) 1 % GEL SMARTSIG:Gram(s) Topical 4 Times Daily   dorzolamidel-timolol (COSOPT) 22.3-6.8 MG/ML SOLN  ophthalmic solution 1 drop 2 (two) times daily.   Dulaglutide 3 MG/0.5ML SOPN Inject into the skin.   erythromycin ophthalmic ointment SMARTSIG:1 Sparingly Left Eye Every Evening   famotidine (PEPCID) 40 MG tablet Take 40 mg by mouth daily.   glucose blood (ONETOUCH VERIO) test strip To monitor sugar 4 times per day   glucose blood (PRECISION QID TEST) test strip To monitor sugar 4 times per day   insulin degludec (TRESIBA FLEXTOUCH) 200  UNIT/ML FlexTouch Pen Inject into the skin.   Insulin Pen Needle (BD PEN NEEDLE NANO U/F) 32G X 4 MM MISC Use once daily for [place] and once weekly for Trulicity   lamoTRIgine (LAMICTAL) 150 MG tablet TAKE 1 TABLET BY MOUTH TWICE A DAY   Lancets (ONETOUCH DELICA PLUS [id number]) MISC 1 DEVICE BY MISC.(NON-DRUG COMBO ROUTE) ROUTE 4 TIMES DAILY.   levETIRAcetam (KEPPRA) 500 MG tablet Take 1 tablet (500 mg total) by mouth 2 (two) times daily.   metFORMIN (GLUCOPHAGE) 500 MG tablet Take 500 mg by mouth 2 (two) times daily.   ONETOUCH VERIO test strip 4 (four) times daily.   simvastatin (ZOCOR) 40 MG tablet Take 1 tablet (40 mg total) by mouth daily.   TOBRADEX ophthalmic ointment SMARTSIG:1 sparingly In Eye(s) Every Night   valsartan-hydrochlorothiazide (DIOVAN-HCT) 320-12.5 MG tablet Take 1 tablet by mouth daily.     Allergies:   Levaquin [levofloxacin]   Social History   Socioeconomic History   Marital status: Widowed    Spouse name: Not on file   Number of children: 4   Years of education: Not on file   Highest education level: Not on file  Occupational History   Not on file  Tobacco Use   Smoking status: Never   Smokeless tobacco: Never  Substance and Sexual Activity   Alcohol use: Not Currently   Drug use: Never   Sexual activity: Not on file  Other Topics Concern   Not on file  Social History Narrative   Lives alone   Social Determinants of Health   Financial Resource Strain: Not on file  Food Insecurity: Not on file  Transportation Needs: Not on file  Physical Activity: Not on file  Stress: Not on file  Social Connections: Not on file     Family History: The patient's sister may have died from something heart related, grandfather as well.  ROS:   Please see the history of present illness.    (+) Headaches (+) Central chest pain (+) Left shoulder/UE pain (+) Abdominal tightness (+) Sudden loss of vision (+) Left LE numbness (+) Bilateral LE edema, feet All  other systems reviewed and are negative.  EKGs/Labs/Other Studies Reviewed:    Echo 01/07/2020:  1. Left ventricular ejection fraction, by estimation, is 55 to 60%. The  left ventricle has normal function. The left ventricle has no regional  wall motion abnormalities. There is mild left ventricular hypertrophy.  Left ventricular diastolic parameters  are consistent with Grade II diastolic dysfunction (pseudonormalization).  Elevated left ventricular end-diastolic pressure.   2. Right ventricular systolic function is normal. The right ventricular  size is normal. Tricuspid regurgitation signal is inadequate for assessing  PA pressure.   3. Left atrial size was mild to moderately dilated.   4. The mitral valve is abnormal. Trivial mitral valve regurgitation. No  evidence of mitral stenosis.   5. The aortic valve is tricuspid. Aortic valve regurgitation is not  visualized. Mild aortic valve sclerosis is present, with no evidence of  aortic valve stenosis.   6. The inferior vena cava is normal in size with <50% respiratory  variability, suggesting right atrial pressure of 8 mmHg.  Nuclear Stress Test 01/07/2020: The left ventricular ejection fraction is normal (55-65%). Nuclear stress EF: 59%. There was no ST segment deviation noted during stress. The study is normal. This is a low risk study.   Normal resting and stress perfusion. No ischemia or infarction EF 59%  EKG:  EKG is personally reviewed and interpreted. 03/19/2021: Sinus rhythm. T wave inversion, inferolateral leads. 12/20/2019: sinus rhythm 61 nonspecific ST-T wave changes noted, T wave inversion noted in V6 and V5, J-point elevation in V3.  Recent Labs: No results found for requested labs within last 8760 hours.   Recent Lipid Panel No results found for: CHOL, TRIG, HDL, CHOLHDL, VLDL, LDLCALC, LDLDIRECT  Physical Exam:    VS:  BP (!) 144/80   Pulse 70   Ht '4\' 11"'  (1.499 m)   Wt 161 lb 4.8 oz (73.2 kg)   SpO2  98%   BMI 32.58 kg/m     Wt Readings from Last 3 Encounters:  03/19/21 161 lb 4.8 oz (73.2 kg)  02/01/20 176 lb (79.8 kg)  01/07/20 175 lb (79.4 kg)     GEN:  Well nourished, well developed in no acute distress HEENT: Normal NECK: No JVD; No carotid bruits LYMPHATICS: No lymphadenopathy CARDIAC: RRR, 1/6 systolic murmur, no rubs, no gallops RESPIRATORY:  Clear to auscultation without rales, wheezing or rhonchi  ABDOMEN: Soft, non-tender, non-distended MUSCULOSKELETAL:  No edema; No deformity  SKIN: Warm and dry NEUROLOGIC:  Alert and oriented x 3 PSYCHIATRIC:  Normal affect   ASSESSMENT:    1. Coronary artery disease involving native coronary artery of native heart without angina pectoris   2. Mixed hyperlipidemia   3. Diabetes mellitus with coincident hypertension ([place])   4. Nonspecific abnormal electrocardiogram (ECG) (EKG)     PLAN:    In order of problems listed above:  Coronary artery disease involving native coronary artery of native heart without angina pectoris Prior stent placement in approximately 2015. 2021 NUC Normal resting and stress perfusion. No ischemia or infarction EF 59%.  Her chest/arm/abdominal/leg pain is fairly atypical and likely noncardiac.  Continue to monitor.   Mixed hyperlipidemia Continuing with current treatment strategy.  No changes made.  On simvastatin 40 mg a day.  No myalgias.  Diabetes mellitus with coincident hypertension ([place]) Also seeing nephrology.  Hemoglobin A1c still little bit elevated.  She showed me her blood pressure recordings at home.  They do wildly fluctuate some as low as 90/40 some as high as 190/95.  I think some of this is likely related to her wrist cuff.  Here for instance we took her blood pressure now is 144/80 and then she took it with her wrist cuff and it was in the 270 systolic.  Also some of this could be anxiety that is related to taking her blood pressure too frequently.  She also sometimes takes her blood  pressure when she is having some discomfort throughout her body.  This can raise it as well.  Of course if she is feeling like she is losing vision and her blood pressure is in the 90 systolic, this is too low and we may need to adjust her medications.  For now, I would like for her to log her blood pressures over the next month in a notebook so that we can understand them better and I have asked  her to use an arm cuff which she states that she has at home in a box.  We will have her come back in and adjust medicines as needed.  Nonspecific abnormal electrocardiogram (ECG) (EKG) T wave inferior laterally unchanged from prior.  Follow-up:   1-2 months.  Medication Adjustments/Labs and Tests Ordered: Current medicines are reviewed at length with the patient today.  Concerns regarding medicines are outlined above.  Orders Placed This Encounter  Procedures   EKG 12-Lead    No orders of the defined types were placed in this encounter.   Patient Instructions  Medication Instructions:  The current medical regimen is effective;  continue present plan and medications.  *If you need a refill on your cardiac medications before your next appointment, please call your pharmacy*  Please check your blood pressure 1 to 2 times a day with an arm blood pressure cuff.  Follow-Up: At [facility], you and your health needs are our priority.  As part of our continuing mission to provide you with exceptional heart care, we have created designated Provider Care Teams.  These Care Teams include your primary Cardiologist (physician) and Advanced Practice Providers (APPs -  Physician Assistants and Nurse Practitioners) who all work together to provide you with the care you need, when you need it.  We recommend signing up for the patient portal called "MyChart".  Sign up information is provided on this After Visit Summary.  MyChart is used to connect with patients for Virtual Visits (Telemedicine).  Patients are  able to view lab/test results, encounter notes, upcoming appointments, etc.  Non-urgent messages can be sent to your provider as well.   To learn more about what you can do with MyChart, go to [email or web address].    Your next appointment:   1 month(s)  The format for your next appointment:   In Person  Provider:   [name], MD  Thank you for choosing [facility]!!     I,Mathew Stumpf,acting as a scribe for [name], MD.,have documented all relevant documentation on the behalf of [name], MD,as directed by  [name], MD while in the presence of [name], MD.  I, [name], MD, have reviewed all documentation for this visit. The documentation on 03/19/21 for the exam, diagnosis, procedures, and orders are all accurate and complete.   Signed, [name], MD  03/19/2021 5:10 PM    [facility]

## 2021-03-19 NOTE — Assessment & Plan Note (Addendum)
Also seeing nephrology.  Hemoglobin A1c still little bit elevated.  She showed me her blood pressure recordings at home.  They do wildly fluctuate some as low as 90/40 some as high as 190/95.  I think some of this is likely related to her wrist cuff.  Here for instance we took her blood pressure now is 144/80 and then she took it with her wrist cuff and it was in the 458 systolic.  Also some of this could be anxiety that is related to taking her blood pressure too frequently.  She also sometimes takes her blood pressure when she is having some discomfort throughout her body.  This can raise it as well.  Of course if she is feeling like she is losing vision and her blood pressure is in the 90 systolic, this is too low and we may need to adjust her medications.  For now, I would like for her to log her blood pressures over the next month in a notebook so that we can understand them better and I have asked her to use an arm cuff which she states that she has at home in a box.  We will have her come back in and adjust medicines as needed.

## 2021-03-19 NOTE — Patient Instructions (Signed)
Medication Instructions:  The current medical regimen is effective;  continue present plan and medications.  *If you need a refill on your cardiac medications before your next appointment, please call your pharmacy*  Please check your blood pressure 1 to 2 times a day with an arm blood pressure cuff.  Follow-Up: At [facility], you and your health needs are our priority.  As part of our continuing mission to provide you with exceptional heart care, we have created designated Provider Care Teams.  These Care Teams include your primary Cardiologist (physician) and Advanced Practice Providers (APPs -  Physician Assistants and Nurse Practitioners) who all work together to provide you with the care you need, when you need it.  We recommend signing up for the patient portal called "MyChart".  Sign up information is provided on this After Visit Summary.  MyChart is used to connect with patients for Virtual Visits (Telemedicine).  Patients are able to view lab/test results, encounter notes, upcoming appointments, etc.  Non-urgent messages can be sent to your provider as well.   To learn more about what you can do with MyChart, go to [email or web address].    Your next appointment:   1 month(s)  The format for your next appointment:   In Person  Provider:   [name], MD  Thank you for choosing [facility]!!

## 2021-03-31 DIAGNOSIS — Z23 Encounter for immunization: Secondary | ICD-10-CM | POA: Diagnosis not present

## 2021-04-12 DIAGNOSIS — D333 Benign neoplasm of cranial nerves: Secondary | ICD-10-CM | POA: Diagnosis not present

## 2021-04-12 DIAGNOSIS — H903 Sensorineural hearing loss, bilateral: Secondary | ICD-10-CM | POA: Diagnosis not present

## 2021-04-20 ENCOUNTER — Other Ambulatory Visit: Payer: Self-pay | Admitting: Diagnostic Neuroimaging

## 2021-04-26 ENCOUNTER — Ambulatory Visit (INDEPENDENT_AMBULATORY_CARE_PROVIDER_SITE_OTHER): Payer: Medicare Other | Admitting: Cardiology

## 2021-04-26 ENCOUNTER — Other Ambulatory Visit: Payer: Self-pay

## 2021-04-26 ENCOUNTER — Encounter: Payer: Self-pay | Admitting: Cardiology

## 2021-04-26 DIAGNOSIS — E782 Mixed hyperlipidemia: Secondary | ICD-10-CM

## 2021-04-26 DIAGNOSIS — E119 Type 2 diabetes mellitus without complications: Secondary | ICD-10-CM

## 2021-04-26 DIAGNOSIS — I10 Essential (primary) hypertension: Secondary | ICD-10-CM

## 2021-04-26 DIAGNOSIS — I2510 Atherosclerotic heart disease of native coronary artery without angina pectoris: Secondary | ICD-10-CM

## 2021-04-26 NOTE — Progress Notes (Signed)
Cardiology Office Note:    Date:  04/26/2021   ID:  Carolyn Little, DOB 1946/01/05, MRN [id number]  PCP:  Benito Mccreedy, MD  [facility] Cardiologist:  [name], MD  [facility] Electrophysiologist:  None   Referring MD: Benito Mccreedy, MD    History of Present Illness:    Carolyn Little is a 75 y.o. female here for the follow up of difficulty control hypertension.  At her initial appointment, she reported moderate SOB with activity. This could be her prior anginal equivalent.  Also had low back pain and headaches.  She was requesting at her primary care provider visit kidney specialist ear doctor and cardiologist. Saw in [place].   CAD with stent 2015. Since then some angina, hospital. Does not recall having a cardiac catheterization after her stent placement.  She takes combination hydrochlorothiazide valsartan 25 320.  Also takes amlodipine.  10/29/2019: TSH 2.5 LDL 122 triglycerides 173 hemoglobin A1c 8.6 creatinine 1.1 potassium 4.9 sodium 141  During the previous visit: She was here with her sister who assists with interpretation.   She presented a blood pressure log today. At home her blood pressure seems to be fluctuating recently.  Since her last visit she continues to suffer from occasional headaches.   In the past few weeks, she has developed a little pain in her central chest and left shoulder. Her shoulder pain radiated to her left UE, and she describes having muscular pain "as if she had exercised." Yesterday she developed a tightness in her abdomen. A few times, her vision suddenly "blacked out" and she temporarily lost her vision.  Additionally, she complains of left LE numbness, especially in her feet. She also noticed some swelling in her bilateral feet.  She denies any palpitations, or shortness of breath. No lightheadedness, syncope, orthopnea, or PND.    Today,  Is accompanied with family member and interpreter. She overall appears well and  her blood pressure has improved since the last visit.  This morning when she woke up her systolic pressure was at 197. She does not feel her BP decrease. She is compliant with her medications.  When she bends over and stands up again she experiences dizziness, and also adds that when she lays down while rolling over she experiences vertigo.  She denies any palpitations, chest pain, or shortness of breath. No headaches, syncope, orthopnea, PND, lower extremity edema or exertional symptoms.   Past Medical History:  Diagnosis Date   Arthritis    Dizziness    DM (diabetes mellitus) (HCC)    Generalized headaches    Hearing loss    High cholesterol    Hypertension    Low back pain    Obesity    Renal insufficiency    Seizure disorder (HCC)       Current Medications: Current Meds  Medication Sig   ACCU-CHEK GUIDE test strip as directed as directed   amLODipine (NORVASC) 5 MG tablet Take 5 mg by mouth daily.   atorvastatin (LIPITOR) 40 MG tablet Take 40 mg by mouth daily.   Blood Glucose Monitoring Suppl (ACCU-CHEK GUIDE) w/Device KIT    Blood Glucose Monitoring Suppl (GLUCOCOM BLOOD GLUCOSE MONITOR) DEVI 1 Device by Misc.(Non-Drug; Combo Route) route as needed (to check blood glucose).   Blood Glucose Monitoring Suppl (ONETOUCH VERIO FLEX SYSTEM) w/Device KIT 1 DEVICE BY MISC.(NON-DRUG COMBO ROUTE) ROUTE AS NEEDED (TO CHECK BLOOD GLUCOSE).   brimonidine (ALPHAGAN) 0.2 % ophthalmic solution 3 (three) times daily.   carvedilol (COREG) 25  MG tablet Take 25 mg by mouth 2 (two) times daily with a meal.   Continuous Blood Gluc Receiver (DEXCOM G6 RECEIVER) DEVI Use as directed for continuous glucose monitoring.   Continuous Blood Gluc Sensor (DEXCOM G6 SENSOR) MISC Inject 1 sensor to the skin every 10 days for continuous glucose monitoring.   Continuous Blood Gluc Transmit (DEXCOM G6 TRANSMITTER) MISC Use as directed for continuous glucose monitoring. Reuse transmitter for 90 days then  discard and replace.   [employer] ASPIRIN ADULT LOW DOSE 81 MG chewable tablet Chew 81 mg by mouth daily.   diclofenac Sodium (VOLTAREN) 1 % GEL SMARTSIG:Gram(s) Topical 4 Times Daily   dorzolamidel-timolol (COSOPT) 22.3-6.8 MG/ML SOLN ophthalmic solution 1 drop 2 (two) times daily.   Dulaglutide 3 MG/0.5ML SOPN Inject into the skin.   erythromycin ophthalmic ointment SMARTSIG:1 Sparingly Left Eye Every Evening   famotidine (PEPCID) 40 MG tablet Take 40 mg by mouth daily.   glucose blood (ONETOUCH VERIO) test strip To monitor sugar 4 times per day   glucose blood (PRECISION QID TEST) test strip To monitor sugar 4 times per day   insulin degludec (TRESIBA FLEXTOUCH) 200 UNIT/ML FlexTouch Pen Inject into the skin.   Insulin Pen Needle (BD PEN NEEDLE NANO U/F) 32G X 4 MM MISC Use once daily for [place] and once weekly for Trulicity   lamoTRIgine (LAMICTAL) 150 MG tablet TAKE 1 TABLET BY MOUTH TWICE A DAY   Lancets (ONETOUCH DELICA PLUS [id number]) MISC 1 DEVICE BY MISC.(NON-DRUG COMBO ROUTE) ROUTE 4 TIMES DAILY.   levETIRAcetam (KEPPRA) 500 MG tablet TAKE 1 TABLET BY MOUTH TWICE A DAY   meclizine (ANTIVERT) 25 MG tablet Take 25 mg by mouth 3 (three) times daily as needed for dizziness.   metFORMIN (GLUCOPHAGE) 500 MG tablet Take 500 mg by mouth 2 (two) times daily.   ONETOUCH VERIO test strip 4 (four) times daily.   simvastatin (ZOCOR) 40 MG tablet Take 1 tablet (40 mg total) by mouth daily.   TOBRADEX ophthalmic ointment SMARTSIG:1 sparingly In Eye(s) Every Night   valsartan-hydrochlorothiazide (DIOVAN-HCT) 320-12.5 MG tablet Take 1 tablet by mouth daily.   Vitamin D, Ergocalciferol, (DRISDOL) 1.25 MG (50000 UNIT) CAPS capsule Take 50,000 Units by mouth once a week.     Allergies:   Levaquin [levofloxacin]   Social History   Socioeconomic History   Marital status: Widowed    Spouse name: Not on file   Number of children: 4   Years of education: Not on file   Highest education level: Not on file   Occupational History   Not on file  Tobacco Use   Smoking status: Never   Smokeless tobacco: Never  Substance and Sexual Activity   Alcohol use: Not Currently   Drug use: Never   Sexual activity: Not on file  Other Topics Concern   Not on file  Social History Narrative   Lives alone   Social Determinants of Health   Financial Resource Strain: Not on file  Food Insecurity: Not on file  Transportation Needs: Not on file  Physical Activity: Not on file  Stress: Not on file  Social Connections: Not on file     Family History: The patient's sister may have died from something heart related, grandfather as well.  ROS:   Please see the history of present illness.    (+) Vertigo All other systems reviewed and are negative.  EKGs/Labs/Other Studies Reviewed:    Echo 01/07/2020:  1. Left ventricular ejection fraction, by estimation,  is 55 to 60%. The  left ventricle has normal function. The left ventricle has no regional  wall motion abnormalities. There is mild left ventricular hypertrophy.  Left ventricular diastolic parameters  are consistent with Grade II diastolic dysfunction (pseudonormalization).  Elevated left ventricular end-diastolic pressure.   2. Right ventricular systolic function is normal. The right ventricular  size is normal. Tricuspid regurgitation signal is inadequate for assessing  PA pressure.   3. Left atrial size was mild to moderately dilated.   4. The mitral valve is abnormal. Trivial mitral valve regurgitation. No  evidence of mitral stenosis.   5. The aortic valve is tricuspid. Aortic valve regurgitation is not  visualized. Mild aortic valve sclerosis is present, with no evidence of  aortic valve stenosis.   6. The inferior vena cava is normal in size with <50% respiratory  variability, suggesting right atrial pressure of 8 mmHg.  Nuclear Stress Test 01/07/2020: The left ventricular ejection fraction is normal (55-65%). Nuclear stress EF:  59%. There was no ST segment deviation noted during stress. The study is normal. This is a low risk study.   Normal resting and stress perfusion. No ischemia or infarction EF 59%  EKG:  EKG is personally reviewed and interpreted. 04/26/2021: EKG was not ordered. 03/19/2021: Sinus rhythm. T wave inversion, inferolateral leads. 12/20/2019: sinus rhythm 61 nonspecific ST-T wave changes noted, T wave inversion noted in V6 and V5, J-point elevation in V3.  Recent Labs: No results found for requested labs within last 8760 hours.   Recent Lipid Panel No results found for: CHOL, TRIG, HDL, CHOLHDL, VLDL, LDLCALC, LDLDIRECT  Physical Exam:    VS:  BP (!) 170/70 (BP Location: Left Arm, Patient Position: Sitting, Cuff Size: Normal)   Pulse 70   Ht _0  (1.499 m)   Wt 157 lb (71.2 kg)   SpO2 97%   BMI 31.71 kg/m     Wt Readings from Last 3 Encounters:  04/26/21 157 lb (71.2 kg)  03/19/21 161 lb 4.8 oz (73.2 kg)  02/01/20 176 lb (79.8 kg)     GEN:  Well nourished, well developed in no acute distress HEENT: Normal NECK: No JVD; No carotid bruits LYMPHATICS: No lymphadenopathy CARDIAC: RRR, 1/6 systolic murmur , no rubs, no gallops RESPIRATORY:  Clear to auscultation without rales, wheezing or rhonchi  ABDOMEN: Soft, non-tender, non-distended MUSCULOSKELETAL:  No edema; No deformity  SKIN: Warm and dry NEUROLOGIC:  Alert and oriented x 3 PSYCHIATRIC:  Normal affect   ASSESSMENT:    1. Diabetes mellitus with coincident hypertension ([place])   2. Mixed hyperlipidemia   3. Coronary artery disease involving native coronary artery of native heart without angina pectoris      PLAN:    In order of problems listed above:  Diabetes mellitus with coincident hypertension ([place]) Blood pressure readings at home have improved.  She comes in with readings today.  As low as 114/54 121/57.  Excellent.  She does have an occasional 150.  For now, lets continue with current multidrug regimen.   We will see her back in 1 year.  Symptoms of turning in bed and feeling dizzy are more equilibrium/vertigo type related.  They are transient.  Continue with good/optimal blood pressure/glucose control for diabetes.  Mixed hyperlipidemia Continue with simvastatin 40 mg once a day.  At future visit could consider transition to atorvastatin 40 mg.  Coronary artery disease involving native coronary artery of native heart without angina pectoris Prior stent placement in 2015.  Continue  with high intensity statin.  I would also advocate continued dose 81 mg aspirin.  Follow-up:   in 1 year  Medication Adjustments/Labs and Tests Ordered: Current medicines are reviewed at length with the patient today.  Concerns regarding medicines are outlined above.  No orders of the defined types were placed in this encounter.   No orders of the defined types were placed in this encounter.   Patient Instructions  Medication Instructions:  The current medical regimen is effective;  continue present plan and medications.  *If you need a refill on your cardiac medications before your next appointment, please call your pharmacy*  Follow-Up: At [facility], you and your health needs are our priority.  As part of our continuing mission to provide you with exceptional heart care, we have created designated Provider Care Teams.  These Care Teams include your primary Cardiologist (physician) and Advanced Practice Providers (APPs -  Physician Assistants and Nurse Practitioners) who all work together to provide you with the care you need, when you need it.  We recommend signing up for the patient portal called "MyChart".  Sign up information is provided on this After Visit Summary.  MyChart is used to connect with patients for Virtual Visits (Telemedicine).  Patients are able to view lab/test results, encounter notes, upcoming appointments, etc.  Non-urgent messages can be sent to your provider as well.   To learn  more about what you can do with MyChart, go to [email or web address].    Your next appointment:   1 year(s)  The format for your next appointment:   In Person  Provider:   [name], MD     Thank you for choosing [facility]!!      I,Tinashe Williams,acting as a scribe for [name], MD.,have documented all relevant documentation on the behalf of [name], MD,as directed by  [name], MD while in the presence of [name], MD.   I, [name], MD, have reviewed all documentation for this visit. The documentation on 04/26/21 for the exam, diagnosis, procedures, and orders are all accurate and complete.   Signed, [name], MD  04/26/2021 11:30 AM    [facility]

## 2021-04-26 NOTE — Assessment & Plan Note (Signed)
Continue with simvastatin 40 mg once a day.  At future visit could consider transition to atorvastatin 40 mg.

## 2021-04-26 NOTE — Patient Instructions (Signed)
Medication Instructions:  The current medical regimen is effective;  continue present plan and medications.  *If you need a refill on your cardiac medications before your next appointment, please call your pharmacy*  Follow-Up: At [facility], you and your health needs are our priority.  As part of our continuing mission to provide you with exceptional heart care, we have created designated Provider Care Teams.  These Care Teams include your primary Cardiologist (physician) and Advanced Practice Providers (APPs -  Physician Assistants and Nurse Practitioners) who all work together to provide you with the care you need, when you need it.  We recommend signing up for the patient portal called "MyChart".  Sign up information is provided on this After Visit Summary.  MyChart is used to connect with patients for Virtual Visits (Telemedicine).  Patients are able to view lab/test results, encounter notes, upcoming appointments, etc.  Non-urgent messages can be sent to your provider as well.   To learn more about what you can do with MyChart, go to [email or web address].    Your next appointment:   1 year(s)  The format for your next appointment:   In Person  Provider:   Mark Skains, MD   Thank you for choosing [facility]!!    

## 2021-04-26 NOTE — Assessment & Plan Note (Signed)
Blood pressure readings at home have improved.  She comes in with readings today.  As low as 114/54 121/57.  Excellent.  She does have an occasional 150.  For now, lets continue with current multidrug regimen.  We will see her back in 1 year.  Symptoms of turning in bed and feeling dizzy are more equilibrium/vertigo type related.  They are transient.  Continue with good/optimal blood pressure/glucose control for diabetes.

## 2021-04-26 NOTE — Assessment & Plan Note (Signed)
Prior stent placement in 2015.  Continue with high intensity statin.  I would also advocate continued dose 81 mg aspirin.

## 2021-04-28 ENCOUNTER — Other Ambulatory Visit: Payer: Self-pay | Admitting: Cardiology

## 2021-04-28 DIAGNOSIS — I10 Essential (primary) hypertension: Secondary | ICD-10-CM

## 2021-04-30 ENCOUNTER — Other Ambulatory Visit: Payer: Self-pay | Admitting: Cardiology

## 2021-05-01 NOTE — Progress Notes (Deleted)
No chief complaint on file.    HISTORY OF PRESENT ILLNESS:  05/01/21 ALL:  Carolyn Little is a 75 y.o. female here today for follow up for follow up for temporal lobe epilepsy. She continues levetiracetam 524m and lamotrigine 1522mBID. She is tolerating medicaitons well with no obvious adverse effects.    HISTORY (copied from [name] note)  [age]ear old female here for evaluation of seizure disorder.   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   Around 2008 patient had onset of staring spells and was diagnosed with temporal lobe epilepsy.  No convulsive seizures.  She was started on lamotrigine and then Keppra was added on.  Since 2010 patient did well did not have any major seizures.  She may have had a breakthrough seizure in 2018 but unclear.  Since that time doing well on lamotrigine and Keppra.   Daughter notes some memory loss and forgetfulness for the past 1 year.  No change in ADLs.  [place].  REVIEW OF SYSTEMS: Out of a complete 14 system review of symptoms, the patient complains only of the following symptoms, and all other reviewed systems are negative.   ALLERGIES: Allergies  Allergen Reactions   Levaquin [Levofloxacin] Hives     HOME MEDICATIONS: Outpatient Medications Prior to Visit  Medication Sig Dispense Refill   ACCU-CHEK GUIDE test strip as directed as directed     amLODipine (NORVASC) 5 MG tablet Take 5 mg by mouth daily.     atorvastatin (LIPITOR) 40 MG tablet Take 40 mg by mouth daily.     Blood Glucose Monitoring Suppl (ACCU-CHEK GUIDE) w/Device KIT      Blood Glucose Monitoring Suppl (GLUCOCOM BLOOD GLUCOSE MONITOR) DEVI 1 Device by Misc.(Non-Drug; Combo Route) route as needed (to check blood glucose).     Blood Glucose Monitoring Suppl (ONETOUCH VERIO FLEX SYSTEM)  w/Device KIT 1 DEVICE BY MISC.(NON-DRUG COMBO ROUTE) ROUTE AS NEEDED (TO CHECK BLOOD GLUCOSE).     brimonidine (ALPHAGAN) 0.2 % ophthalmic solution 3 (three) times daily.     carvedilol (COREG) 25 MG tablet Take 25 mg by mouth 2 (two) times daily with a meal.     Continuous Blood Gluc Receiver (DEXCOM G6 RECEIVER) DEVI Use as directed for continuous glucose monitoring.     Continuous Blood Gluc Sensor (DEXCOM G6 SENSOR) MISC Inject 1 sensor to the skin every 10 days for continuous glucose monitoring.     Continuous Blood Gluc Transmit (DEXCOM G6 TRANSMITTER) MISC Use as directed for continuous glucose monitoring. Reuse transmitter for 90 days then discard and replace.     CVS ASPIRIN ADULT LOW DOSE 81 MG chewable tablet Chew 81 mg by mouth daily.     diclofenac Sodium (VOLTAREN) 1 % GEL SMARTSIG:Gram(s) Topical 4 Times Daily     dorzolamidel-timolol (COSOPT) 22.3-6.8 MG/ML SOLN ophthalmic solution 1 drop 2 (two) times daily.     Dulaglutide 3 MG/0.5ML SOPN Inject into the skin.     erythromycin ophthalmic ointment SMARTSIG:1 Sparingly Left Eye Every Evening     famotidine (PEPCID) 40 MG tablet Take 40 mg by mouth daily.     glucose blood (ONETOUCH VERIO) test strip To monitor sugar 4 times per day     glucose blood (PRECISION QID TEST) test strip To monitor sugar 4 times per day     insulin  degludec (TRESIBA FLEXTOUCH) 200 UNIT/ML FlexTouch Pen Inject into the skin.     Insulin Pen Needle (BD PEN NEEDLE NANO U/F) 32G X 4 MM MISC Use once daily for [place] and once weekly for Trulicity     lamoTRIgine (LAMICTAL) 150 MG tablet TAKE 1 TABLET BY MOUTH TWICE A DAY 180 tablet 4   Lancets (ONETOUCH DELICA PLUS [id number]) MISC 1 DEVICE BY MISC.(NON-DRUG COMBO ROUTE) ROUTE 4 TIMES DAILY.     levETIRAcetam (KEPPRA) 500 MG tablet TAKE 1 TABLET BY MOUTH TWICE A DAY 180 tablet 4   meclizine (ANTIVERT) 25 MG tablet Take 25 mg by mouth 3 (three) times daily as needed for dizziness.     metFORMIN (GLUCOPHAGE)  500 MG tablet Take 500 mg by mouth 2 (two) times daily.     ONETOUCH VERIO test strip 4 (four) times daily.     simvastatin (ZOCOR) 40 MG tablet TAKE 1 TABLET BY MOUTH EVERY DAY 90 tablet 3   TOBRADEX ophthalmic ointment SMARTSIG:1 sparingly In Eye(s) Every Night     valsartan-hydrochlorothiazide (DIOVAN-HCT) 320-12.5 MG tablet Take 1 tablet by mouth daily. 90 tablet 3   Vitamin D, Ergocalciferol, (DRISDOL) 1.25 MG (50000 UNIT) CAPS capsule Take 50,000 Units by mouth once a week.     No facility-administered medications prior to visit.     PAST MEDICAL HISTORY: Past Medical History:  Diagnosis Date   Arthritis    Dizziness    DM (diabetes mellitus) ([place])    Generalized headaches    Hearing loss    High cholesterol    Hypertension    Low back pain    Obesity    Renal insufficiency    Seizure disorder ([name])      PAST SURGICAL HISTORY: Past Surgical History:  Procedure Laterality Date   BACK SURGERY     BRAIN TUMOR EXCISION  1984   HAND SURGERY     LAPAROSCOPIC HYSTERECTOMY     SHOULDER SURGERY       FAMILY HISTORY: No family history on file.   SOCIAL HISTORY: Social History   Socioeconomic History   Marital status: Widowed    Spouse name: Not on file   Number of children: 4   Years of education: Not on file   Highest education level: Not on file  Occupational History   Not on file  Tobacco Use   Smoking status: Never   Smokeless tobacco: Never  Substance and Sexual Activity   Alcohol use: Not Currently   Drug use: Never   Sexual activity: Not on file  Other Topics Concern   Not on file  Social History Narrative   Lives alone   Social Determinants of Health   Financial Resource Strain: Not on file  Food Insecurity: Not on file  Transportation Needs: Not on file  Physical Activity: Not on file  Stress: Not on file  Social Connections: Not on file  Intimate Partner Violence: Not on file     PHYSICAL EXAM  There were no vitals filed for this  visit. There is no height or weight on file to calculate BMI.  Generalized: Well developed, in no acute distress  Cardiology: normal rate and rhythm, no murmur auscultated  Respiratory: clear to auscultation bilaterally    Neurological examination  Mentation: Alert oriented to time, place, history taking. Follows all commands speech and language fluent Cranial nerve II-XII: Pupils were equal round reactive to light. Extraocular movements were full, visual field were full on confrontational test. Facial sensation and  strength were normal. Uvula tongue midline. Head turning and shoulder shrug  were normal and symmetric. Motor: The motor testing reveals 5 over 5 strength of all 4 extremities. Good symmetric motor tone is noted throughout.  Sensory: Sensory testing is intact to soft touch on all 4 extremities. No evidence of extinction is noted.  Coordination: Cerebellar testing reveals good finger-nose-finger and heel-to-shin bilaterally.  Gait and station: Gait is normal. Tandem gait is normal. Romberg is negative. No drift is seen.  Reflexes: Deep tendon reflexes are symmetric and normal bilaterally.    DIAGNOSTIC DATA (LABS, IMAGING, TESTING) - I reviewed patient records, labs, notes, testing and imaging myself where available.  No results found for: WBC, HGB, HCT, MCV, PLT No results found for: NA, K, CL, CO2, GLUCOSE, BUN, CREATININE, CALCIUM, PROT, ALBUMIN, AST, ALT, ALKPHOS, BILITOT, GFRNONAA, GFRAA No results found for: CHOL, HDL, LDLCALC, LDLDIRECT, TRIG, CHOLHDL No results found for: HGBA1C No results found for: VITAMINB12 No results found for: TSH  No flowsheet data found.   No flowsheet data found.   ASSESSMENT AND PLAN  75 y.o. year old female  has a past medical history of Arthritis, Dizziness, DM (diabetes mellitus) ([place]), Generalized headaches, Hearing loss, High cholesterol, Hypertension, Low back pain, Obesity, Renal insufficiency, and Seizure disorder ([place]). here  with    No diagnosis found.   No orders of the defined types were placed in this encounter.    No orders of the defined types were placed in this encounter.     Debbora Presto, MSN, FNP-C 05/01/2021, 12:17 PM  [facility] [address] [place], [place] [address] [phone]

## 2021-05-01 NOTE — Patient Instructions (Incomplete)
Below is our plan:  We will continue levetiracetam 500mg  and lamotrigine 150mg  twice daily.   Please make sure you are staying well hydrated. I recommend 50-60 ounces daily. Well balanced diet and regular exercise encouraged. Consistent sleep schedule with 6-8 hours recommended.   Please continue follow up with care team as directed.   Follow up with me in 1 year   You may receive a survey regarding today's visit. I encourage you to leave honest feed back as I do use this information to improve patient care. Thank you for seeing me today!

## 2021-05-02 ENCOUNTER — Ambulatory Visit: Payer: Medicare Other | Admitting: Family Medicine

## 2021-05-02 DIAGNOSIS — G40109 Localization-related (focal) (partial) symptomatic epilepsy and epileptic syndromes with simple partial seizures, not intractable, without status epilepticus: Secondary | ICD-10-CM

## 2021-05-31 ENCOUNTER — Other Ambulatory Visit: Payer: Self-pay

## 2021-05-31 MED ORDER — CARVEDILOL 25 MG PO TABS: 25.0000 mg | ORAL_TABLET | Freq: Two times a day (BID) | ORAL | 3 refills | Status: DC

## 2021-05-31 NOTE — Telephone Encounter (Signed)
Pt's medication was sent to pt's pharmacy as requested. Confirmation received.  °

## 2021-06-21 DIAGNOSIS — Z961 Presence of intraocular lens: Secondary | ICD-10-CM | POA: Diagnosis not present

## 2021-06-21 DIAGNOSIS — E113313 Type 2 diabetes mellitus with moderate nonproliferative diabetic retinopathy with macular edema, bilateral: Secondary | ICD-10-CM | POA: Diagnosis not present

## 2021-06-21 DIAGNOSIS — H40013 Open angle with borderline findings, low risk, bilateral: Secondary | ICD-10-CM | POA: Diagnosis not present

## 2021-06-25 DIAGNOSIS — I10 Essential (primary) hypertension: Secondary | ICD-10-CM | POA: Diagnosis not present

## 2021-06-25 DIAGNOSIS — E782 Mixed hyperlipidemia: Secondary | ICD-10-CM | POA: Diagnosis not present

## 2021-06-25 DIAGNOSIS — E1165 Type 2 diabetes mellitus with hyperglycemia: Secondary | ICD-10-CM | POA: Diagnosis not present

## 2021-06-25 DIAGNOSIS — E559 Vitamin D deficiency, unspecified: Secondary | ICD-10-CM | POA: Diagnosis not present

## 2021-06-25 DIAGNOSIS — Z794 Long term (current) use of insulin: Secondary | ICD-10-CM | POA: Diagnosis not present

## 2021-06-26 ENCOUNTER — Ambulatory Visit (INDEPENDENT_AMBULATORY_CARE_PROVIDER_SITE_OTHER): Payer: Medicare Other | Admitting: Diagnostic Neuroimaging

## 2021-06-26 ENCOUNTER — Encounter: Payer: Self-pay | Admitting: Diagnostic Neuroimaging

## 2021-06-26 VITALS — BP 166/81 | HR 71 | Ht 59.0 in | Wt 159.4 lb

## 2021-06-26 DIAGNOSIS — G40109 Localization-related (focal) (partial) symptomatic epilepsy and epileptic syndromes with simple partial seizures, not intractable, without status epilepticus: Secondary | ICD-10-CM

## 2021-06-26 MED ORDER — LAMOTRIGINE 150 MG PO TABS: 150.0000 mg | ORAL_TABLET | Freq: Every evening | ORAL | 4 refills | Status: DC

## 2021-06-26 MED ORDER — LEVETIRACETAM 500 MG PO TABS: 500.0000 mg | ORAL_TABLET | Freq: Every day | ORAL | 4 refills | Status: DC

## 2021-06-26 NOTE — Addendum Note (Signed)
Addended by: Andrey Spearman R on: 06/26/2021 11:15 AM   Modules accepted: Level of Service

## 2021-06-26 NOTE — Progress Notes (Addendum)
[facility]  PATIENT: Carolyn Little DOB: Jan 02, 1946  REFERRING CLINICIAN: Benito Mccreedy, MD HISTORY FROM: patient and daughter REASON FOR VISIT: follow up   [place]:  Chief Complaint  Patient presents with   Epilepsy    Rm 7,dgtr/interpreter- Sonia  "no seizures; she never took seizures meds twice a day, has always only taken once daily"     HISTORY OF PRESENT ILLNESS:   UPDATE (06/26/21, VRP): Since last visit, doing well. No seizures. Tolerating meds. Actually pt was only taking levetiracetam and lamictal once daily each (not twice a day).   PRIOR HPI (02/01/20): 76 year old female here for evaluation of seizure disorder.  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  Around 2008 patient had onset of staring spells and was diagnosed with temporal lobe epilepsy.  No convulsive seizures.  She was started on lamotrigine and then Keppra was added on.  Since 2010 patient did well did not have any major seizures.  She may have had a breakthrough seizure in 2018 but unclear.  Since that time doing well on lamotrigine and Keppra.  Daughter notes some memory loss and forgetfulness for the past 1 year.  No change in ADLs.  Patient used to live in [place] and moved to [place] in June 2021.   REVIEW OF SYSTEMS: Full 14 system review of systems performed and negative with exception of: As per HPI.  ALLERGIES: Allergies  Allergen Reactions   Levaquin [Levofloxacin] Hives    HOME MEDICATIONS: Outpatient Medications Prior to Visit  Medication Sig Dispense Refill   ACCU-CHEK GUIDE test strip as directed as directed     amLODipine (NORVASC) 2.5 MG tablet Take 2.5 mg by mouth daily.     atorvastatin (LIPITOR) 40 MG tablet Take 40 mg by mouth daily.     Blood Glucose Monitoring Suppl (ACCU-CHEK GUIDE) w/Device KIT      Blood Glucose  Monitoring Suppl (GLUCOCOM BLOOD GLUCOSE MONITOR) DEVI 1 Device by Misc.(Non-Drug; Combo Route) route as needed (to check blood glucose).     Blood Glucose Monitoring Suppl (ONETOUCH VERIO FLEX SYSTEM) w/Device KIT 1 DEVICE BY MISC.(NON-DRUG COMBO ROUTE) ROUTE AS NEEDED (TO CHECK BLOOD GLUCOSE).     brimonidine (ALPHAGAN) 0.2 % ophthalmic solution 3 (three) times daily.     carvedilol (COREG) 25 MG tablet Take 1 tablet (25 mg total) by mouth 2 (two) times daily with a meal. 180 tablet 3   Continuous Blood Gluc Receiver (DEXCOM G6 RECEIVER) DEVI Use as directed for continuous glucose monitoring.     Continuous Blood Gluc Sensor (DEXCOM G6 SENSOR) MISC Inject 1 sensor to the skin every 10 days for continuous glucose monitoring.     Continuous Blood Gluc Transmit (DEXCOM G6 TRANSMITTER) MISC Use as directed for continuous glucose monitoring. Reuse transmitter for 90 days then discard and replace.     CVS ASPIRIN ADULT LOW DOSE 81 MG chewable tablet Chew 81 mg by mouth daily.     diclofenac Sodium (VOLTAREN) 1 % GEL SMARTSIG:Gram(s) Topical 4 Times Daily     dorzolamidel-timolol (COSOPT) 22.3-6.8 MG/ML SOLN ophthalmic solution 1 drop 2 (two) times daily.     erythromycin ophthalmic ointment SMARTSIG:1 Sparingly Left Eye Every Evening     famotidine (PEPCID) 40 MG tablet Take 40 mg by mouth daily.     glucose blood (ONETOUCH VERIO) test strip To monitor sugar 4 times per day  glucose blood (PRECISION QID TEST) test strip To monitor sugar 4 times per day     insulin degludec (TRESIBA FLEXTOUCH) 200 UNIT/ML FlexTouch Pen Inject into the skin.     Insulin Pen Needle (BD PEN NEEDLE NANO U/F) 32G X 4 MM MISC Use once daily for [place] and once weekly for Trulicity     Lancets (ONETOUCH DELICA PLUS [id number]) MISC 1 DEVICE BY MISC.(NON-DRUG COMBO ROUTE) ROUTE 4 TIMES DAILY.     meclizine (ANTIVERT) 25 MG tablet Take 25 mg by mouth 3 (three) times daily as needed for dizziness.     metFORMIN (GLUCOPHAGE)  500 MG tablet Take 500 mg by mouth 2 (two) times daily.     ONETOUCH VERIO test strip 4 (four) times daily.     TOBRADEX ophthalmic ointment SMARTSIG:1 sparingly In Eye(s) Every Night     TRULICITY 1.5 WL/7.9GX SOPN Inject into the skin. 30 units weekly     valsartan-hydrochlorothiazide (DIOVAN-HCT) 320-12.5 MG tablet Take 1 tablet by mouth daily. 90 tablet 3   Vitamin D, Ergocalciferol, (DRISDOL) 1.25 MG (50000 UNIT) CAPS capsule Take 50,000 Units by mouth once a week.     lamoTRIgine (LAMICTAL) 150 MG tablet TAKE 1 TABLET BY MOUTH TWICE A DAY 180 tablet 4   levETIRAcetam (KEPPRA) 500 MG tablet TAKE 1 TABLET BY MOUTH TWICE A DAY 180 tablet 4   amLODipine (NORVASC) 5 MG tablet Take 5 mg by mouth daily.     Dulaglutide 3 MG/0.5ML SOPN Inject into the skin.     simvastatin (ZOCOR) 40 MG tablet TAKE 1 TABLET BY MOUTH EVERY DAY 90 tablet 3   No facility-administered medications prior to visit.    PAST MEDICAL HISTORY: Past Medical History:  Diagnosis Date   Arthritis    Dizziness    DM (diabetes mellitus) ([place])    Generalized headaches    Hearing loss    High cholesterol    Hypertension    Low back pain    Obesity    Renal insufficiency    Seizure disorder ([place])     PAST SURGICAL HISTORY: Past Surgical History:  Procedure Laterality Date   BACK SURGERY     BRAIN TUMOR EXCISION  1984   HAND SURGERY     LAPAROSCOPIC HYSTERECTOMY     SHOULDER SURGERY      FAMILY HISTORY: No family history on file.  SOCIAL HISTORY: Social History   Socioeconomic History   Marital status: Widowed    Spouse name: Not on file   Number of children: 4   Years of education: Not on file   Highest education level: Not on file  Occupational History   Not on file  Tobacco Use   Smoking status: Never   Smokeless tobacco: Never  Substance and Sexual Activity   Alcohol use: Not Currently   Drug use: Never   Sexual activity: Not on file  Other Topics Concern   Not on file  Social History  Narrative   Lives alone   Social Determinants of Health   Financial Resource Strain: Not on file  Food Insecurity: Not on file  Transportation Needs: Not on file  Physical Activity: Not on file  Stress: Not on file  Social Connections: Not on file  Intimate Partner Violence: Not on file     PHYSICAL EXAM  GENERAL EXAM/CONSTITUTIONAL: Vitals:  Vitals:   06/26/21 1044 06/26/21 1113  BP: (!) 192/84 (!) 166/81  Pulse: 70 71  Weight: 159 lb 6.4 oz (72.3 kg)  Height: _0  (1.499 m)    Body mass index is 32.19 kg/m. Wt Readings from Last 3 Encounters:  06/26/21 159 lb 6.4 oz (72.3 kg)  04/26/21 157 lb (71.2 kg)  03/19/21 161 lb 4.8 oz (73.2 kg)   Patient is in no distress; well developed, nourished and groomed; neck is supple  CARDIOVASCULAR: Examination of carotid arteries is normal; no carotid bruits Regular rate and rhythm, no murmurs Examination of peripheral vascular system by observation and palpation is normal  EYES: Ophthalmoscopic exam of optic discs and posterior segments is normal; no papilledema or hemorrhages No results found.  MUSCULOSKELETAL: Gait, strength, tone, movements noted in Neurologic exam below  NEUROLOGIC: MENTAL STATUS:  No flowsheet data found. awake, alert, oriented to person, place and time recent and remote memory intact normal attention and concentration language fluent, comprehension intact, naming intact fund of knowledge appropriate  CRANIAL NERVE:  2nd - no papilledema on fundoscopic exam 2nd, 3rd, 4th, 6th - pupils equal and reactive to light, visual fields full to confrontation, extraocular muscles intact, no nystagmus 5th - facial sensation symmetric 7th - facial strength --> LEFT UPPER AND LEFT LOWER FACIAL WEAKNESS 8th - hearing --> DECR IN LEFT 9th - palate elevates symmetrically, uvula midline 11th - shoulder shrug symmetric 12th - tongue protrusion midline  MOTOR:  normal bulk and tone, full strength in the  BUE, BLE  SENSORY:  normal and symmetric to light touch  COORDINATION:  finger-nose-finger, fine finger movements normal  REFLEXES:  deep tendon reflexes 1+ and symmetric  GAIT/STATION:  narrow based gait     DIAGNOSTIC DATA (LABS, IMAGING, TESTING) - I reviewed patient records, labs, notes, testing and imaging myself where available.  No results found for: WBC, HGB, HCT, MCV, PLT No results found for: NA, K, CL, CO2, GLUCOSE, BUN, CREATININE, CALCIUM, PROT, ALBUMIN, AST, ALT, ALKPHOS, BILITOT, GFRNONAA, GFRAA No results found for: CHOL, HDL, LDLCALC, LDLDIRECT, TRIG, CHOLHDL No results found for: HGBA1C No results found for: VITAMINB12 No results found for: TSH     ASSESSMENT AND PLAN  76 y.o. year old female here with history of seizures since 2008, with last seizures in 2010 and possibly 2018.  Stable on medication.  Also history of left acoustic neuroma status post resection 1984, stable.  Dx:  1. Temporal lobe epilepsy (HCC)      PLAN:  Temporal lobe epilepsy (stable doses from [place]; was only on 1 tab daily each for the meds, and stable; last seizure ~2018) - continue levetiracetam 556m in AM  - continue lamotrigine 1557min PM   HYPERTENSION - had SBP 130's this AM at home; now elevated (160-190's) but asymptomatic; follow up with PCP  Meds ordered this encounter  Medications   levETIRAcetam (KEPPRA) 500 MG tablet    Sig: Take 1 tablet (500 mg total) by mouth daily.    Dispense:  90 tablet    Refill:  4   lamoTRIgine (LAMICTAL) 150 MG tablet    Sig: Take 1 tablet (150 mg total) by mouth every evening.    Dispense:  90 tablet    Refill:  4   Return in about 1 year (around 06/26/2022) for MyChart visit (15 min).    VIPenni BombardMD 05/31/01/[id number]:12M Certified in Neurology, Neurophysiology and Neuroimaging  [facility] [address]Su[place] [id number]

## 2021-07-18 DIAGNOSIS — E113313 Type 2 diabetes mellitus with moderate nonproliferative diabetic retinopathy with macular edema, bilateral: Secondary | ICD-10-CM | POA: Diagnosis not present

## 2021-07-18 DIAGNOSIS — H43811 Vitreous degeneration, right eye: Secondary | ICD-10-CM | POA: Diagnosis not present

## 2021-07-18 DIAGNOSIS — H31091 Other chorioretinal scars, right eye: Secondary | ICD-10-CM | POA: Diagnosis not present

## 2021-07-18 DIAGNOSIS — H43392 Other vitreous opacities, left eye: Secondary | ICD-10-CM | POA: Diagnosis not present

## 2021-07-19 DIAGNOSIS — Z961 Presence of intraocular lens: Secondary | ICD-10-CM | POA: Diagnosis not present

## 2021-07-19 DIAGNOSIS — H40013 Open angle with borderline findings, low risk, bilateral: Secondary | ICD-10-CM | POA: Diagnosis not present

## 2021-07-19 DIAGNOSIS — E113313 Type 2 diabetes mellitus with moderate nonproliferative diabetic retinopathy with macular edema, bilateral: Secondary | ICD-10-CM | POA: Diagnosis not present

## 2021-07-26 DIAGNOSIS — I10 Essential (primary) hypertension: Secondary | ICD-10-CM | POA: Diagnosis not present

## 2021-07-26 DIAGNOSIS — E782 Mixed hyperlipidemia: Secondary | ICD-10-CM | POA: Diagnosis not present

## 2021-07-26 DIAGNOSIS — E1165 Type 2 diabetes mellitus with hyperglycemia: Secondary | ICD-10-CM | POA: Diagnosis not present

## 2021-07-26 DIAGNOSIS — E559 Vitamin D deficiency, unspecified: Secondary | ICD-10-CM | POA: Diagnosis not present

## 2021-07-26 DIAGNOSIS — Z794 Long term (current) use of insulin: Secondary | ICD-10-CM | POA: Diagnosis not present

## 2021-08-21 DIAGNOSIS — E113312 Type 2 diabetes mellitus with moderate nonproliferative diabetic retinopathy with macular edema, left eye: Secondary | ICD-10-CM | POA: Diagnosis not present

## 2021-08-22 DIAGNOSIS — H40013 Open angle with borderline findings, low risk, bilateral: Secondary | ICD-10-CM | POA: Diagnosis not present

## 2021-08-23 DIAGNOSIS — E559 Vitamin D deficiency, unspecified: Secondary | ICD-10-CM | POA: Diagnosis not present

## 2021-08-23 DIAGNOSIS — K219 Gastro-esophageal reflux disease without esophagitis: Secondary | ICD-10-CM | POA: Diagnosis not present

## 2021-08-23 DIAGNOSIS — I10 Essential (primary) hypertension: Secondary | ICD-10-CM | POA: Diagnosis not present

## 2021-08-23 DIAGNOSIS — E782 Mixed hyperlipidemia: Secondary | ICD-10-CM | POA: Diagnosis not present

## 2021-08-23 DIAGNOSIS — E1165 Type 2 diabetes mellitus with hyperglycemia: Secondary | ICD-10-CM | POA: Diagnosis not present

## 2021-08-23 DIAGNOSIS — Z794 Long term (current) use of insulin: Secondary | ICD-10-CM | POA: Diagnosis not present

## 2021-09-03 ENCOUNTER — Other Ambulatory Visit (INDEPENDENT_AMBULATORY_CARE_PROVIDER_SITE_OTHER): Payer: Self-pay

## 2021-09-03 DIAGNOSIS — H1131 Conjunctival hemorrhage, right eye: Secondary | ICD-10-CM | POA: Diagnosis not present

## 2021-09-13 DIAGNOSIS — Z794 Long term (current) use of insulin: Secondary | ICD-10-CM | POA: Diagnosis not present

## 2021-09-13 DIAGNOSIS — E1165 Type 2 diabetes mellitus with hyperglycemia: Secondary | ICD-10-CM | POA: Diagnosis not present

## 2021-09-13 DIAGNOSIS — E782 Mixed hyperlipidemia: Secondary | ICD-10-CM | POA: Diagnosis not present

## 2021-09-13 DIAGNOSIS — K219 Gastro-esophageal reflux disease without esophagitis: Secondary | ICD-10-CM | POA: Diagnosis not present

## 2021-09-13 DIAGNOSIS — E559 Vitamin D deficiency, unspecified: Secondary | ICD-10-CM | POA: Diagnosis not present

## 2021-09-13 DIAGNOSIS — I10 Essential (primary) hypertension: Secondary | ICD-10-CM | POA: Diagnosis not present

## 2021-09-17 DIAGNOSIS — H40013 Open angle with borderline findings, low risk, bilateral: Secondary | ICD-10-CM | POA: Diagnosis not present

## 2021-09-17 DIAGNOSIS — H1131 Conjunctival hemorrhage, right eye: Secondary | ICD-10-CM | POA: Diagnosis not present

## 2021-09-27 DIAGNOSIS — E1165 Type 2 diabetes mellitus with hyperglycemia: Secondary | ICD-10-CM | POA: Diagnosis not present

## 2021-09-27 DIAGNOSIS — E782 Mixed hyperlipidemia: Secondary | ICD-10-CM | POA: Diagnosis not present

## 2021-09-27 DIAGNOSIS — I10 Essential (primary) hypertension: Secondary | ICD-10-CM | POA: Diagnosis not present

## 2021-09-27 DIAGNOSIS — E559 Vitamin D deficiency, unspecified: Secondary | ICD-10-CM | POA: Diagnosis not present

## 2021-09-27 DIAGNOSIS — K219 Gastro-esophageal reflux disease without esophagitis: Secondary | ICD-10-CM | POA: Diagnosis not present

## 2021-09-27 DIAGNOSIS — Z794 Long term (current) use of insulin: Secondary | ICD-10-CM | POA: Diagnosis not present

## 2021-10-03 ENCOUNTER — Other Ambulatory Visit: Payer: Self-pay

## 2021-10-03 MED ORDER — LEVETIRACETAM 500 MG PO TABS: 500.0000 mg | ORAL_TABLET | Freq: Every day | ORAL | 4 refills | Status: DC

## 2021-10-03 MED ORDER — ATORVASTATIN CALCIUM 40 MG PO TABS: 40.0000 mg | ORAL_TABLET | Freq: Every day | ORAL | 1 refills | Status: DC

## 2021-10-03 MED ORDER — LAMOTRIGINE 150 MG PO TABS: 150.0000 mg | ORAL_TABLET | Freq: Every evening | ORAL | 4 refills | Status: DC

## 2021-10-03 NOTE — Telephone Encounter (Signed)
[facility] mail order pharmacy is requesting a refill on simvastatin. This medication was D/C by another provider and pt now takes Atorvastatin. Would Dr. Marlou Porch like to refill this medication? Please address ?

## 2021-10-04 DIAGNOSIS — I10 Essential (primary) hypertension: Secondary | ICD-10-CM | POA: Diagnosis not present

## 2021-10-04 DIAGNOSIS — K219 Gastro-esophageal reflux disease without esophagitis: Secondary | ICD-10-CM | POA: Diagnosis not present

## 2021-10-04 DIAGNOSIS — Z794 Long term (current) use of insulin: Secondary | ICD-10-CM | POA: Diagnosis not present

## 2021-10-04 DIAGNOSIS — E1165 Type 2 diabetes mellitus with hyperglycemia: Secondary | ICD-10-CM | POA: Diagnosis not present

## 2021-10-04 DIAGNOSIS — E559 Vitamin D deficiency, unspecified: Secondary | ICD-10-CM | POA: Diagnosis not present

## 2021-10-04 DIAGNOSIS — E782 Mixed hyperlipidemia: Secondary | ICD-10-CM | POA: Diagnosis not present

## 2021-10-08 ENCOUNTER — Telehealth: Payer: Self-pay

## 2021-10-08 NOTE — Telephone Encounter (Signed)
NOTES SCANNED TO REFERRAL 

## 2021-10-12 ENCOUNTER — Encounter (HOSPITAL_BASED_OUTPATIENT_CLINIC_OR_DEPARTMENT_OTHER): Payer: Self-pay

## 2021-10-12 ENCOUNTER — Emergency Department (HOSPITAL_BASED_OUTPATIENT_CLINIC_OR_DEPARTMENT_OTHER): Payer: Medicare Other

## 2021-10-12 ENCOUNTER — Emergency Department (HOSPITAL_BASED_OUTPATIENT_CLINIC_OR_DEPARTMENT_OTHER)
Admission: EM | Admit: 2021-10-12 | Discharge: 2021-10-12 | Disposition: A | Discharge status: Home / Self Care | Payer: Medicare Other | Attending: Emergency Medicine | Admitting: Emergency Medicine

## 2021-10-12 ENCOUNTER — Other Ambulatory Visit: Payer: Self-pay

## 2021-10-12 DIAGNOSIS — Z7982 Long term (current) use of aspirin: Secondary | ICD-10-CM | POA: Insufficient documentation

## 2021-10-12 DIAGNOSIS — M545 Low back pain, unspecified: Secondary | ICD-10-CM | POA: Insufficient documentation

## 2021-10-12 DIAGNOSIS — Z79899 Other long term (current) drug therapy: Secondary | ICD-10-CM | POA: Diagnosis not present

## 2021-10-12 DIAGNOSIS — Z7984 Long term (current) use of oral hypoglycemic drugs: Secondary | ICD-10-CM | POA: Insufficient documentation

## 2021-10-12 DIAGNOSIS — Z794 Long term (current) use of insulin: Secondary | ICD-10-CM | POA: Insufficient documentation

## 2021-10-12 DIAGNOSIS — R519 Headache, unspecified: Secondary | ICD-10-CM | POA: Diagnosis not present

## 2021-10-12 DIAGNOSIS — I10 Essential (primary) hypertension: Secondary | ICD-10-CM | POA: Diagnosis not present

## 2021-10-12 DIAGNOSIS — M79602 Pain in left arm: Secondary | ICD-10-CM | POA: Diagnosis not present

## 2021-10-12 DIAGNOSIS — E119 Type 2 diabetes mellitus without complications: Secondary | ICD-10-CM | POA: Diagnosis not present

## 2021-10-12 LAB — URINALYSIS, ROUTINE W REFLEX MICROSCOPIC
Bilirubin Urine: NEGATIVE
Glucose, UA: NEGATIVE mg/dL
Hgb urine dipstick: NEGATIVE
Ketones, ur: NEGATIVE mg/dL
Nitrite: NEGATIVE
Protein, ur: NEGATIVE mg/dL
Specific Gravity, Urine: <=1.005 (ref 1.005–1.030)
pH: 5.5 (ref 5.0–8.0)

## 2021-10-12 LAB — BASIC METABOLIC PANEL
Anion gap: 8 (ref 5–15)
BUN: 18 mg/dL (ref 8–23)
CO2: 27 mmol/L (ref 22–32)
Calcium: 9.7 mg/dL (ref 8.9–10.3)
Chloride: 104 mmol/L (ref 98–111)
Creatinine, Ser: 0.95 mg/dL (ref 0.44–1.00)
GFR, Estimated: >60 mL/min (ref >60)
Glucose, Bld: 116 mg/dL — ABNORMAL HIGH (ref 70–99)
Potassium: 4 mmol/L (ref 3.5–5.1)
Sodium: 139 mmol/L (ref 135–145)

## 2021-10-12 LAB — TROPONIN I (HIGH SENSITIVITY)
Troponin I (High Sensitivity): 7 ng/L (ref <18)
Troponin I (High Sensitivity): 8 ng/L (ref <18)

## 2021-10-12 LAB — CBC
HCT: 39.7 % (ref 36.0–46.0)
Hemoglobin: 12.7 g/dL (ref 12.0–15.0)
MCH: 26.7 pg (ref 26.0–34.0)
MCHC: 32 g/dL (ref 30.0–36.0)
MCV: 83.6 fL (ref 80.0–100.0)
Platelets: 293 10*3/uL (ref 150–400)
RBC: 4.75 MIL/uL (ref 3.87–5.11)
RDW: 13.2 % (ref 11.5–15.5)
WBC: 8.6 10*3/uL (ref 4.0–10.5)
nRBC: 0 % (ref 0.0–0.2)

## 2021-10-12 LAB — URINALYSIS, MICROSCOPIC (REFLEX): RBC / HPF: NONE SEEN RBC/hpf (ref 0–5)

## 2021-10-12 MED ORDER — CARVEDILOL 12.5 MG PO TABS
25.0000 mg | ORAL_TABLET | Freq: Once | ORAL | Status: AC
  Administered 2021-10-12: 25 mg via ORAL
  Filled 2021-10-12: qty 2

## 2021-10-12 MED ORDER — DIPHENHYDRAMINE HCL 50 MG/ML IJ SOLN
25.0000 mg | Freq: Once | INTRAMUSCULAR | Status: DC
  Filled 2021-10-12: qty 1

## 2021-10-12 MED ORDER — METOCLOPRAMIDE HCL 5 MG/ML IJ SOLN
10.0000 mg | Freq: Once | INTRAMUSCULAR | Status: DC
  Filled 2021-10-12: qty 2

## 2021-10-12 NOTE — ED Notes (Signed)
MD made aware that pt refused reglan and benadryl

## 2021-10-12 NOTE — ED Notes (Signed)
Pt ambulatory to triage, steady gait, will provide urine specimen

## 2021-10-12 NOTE — ED Provider Notes (Signed)
[facility] Provider Note   CSN: [id number] Arrival date & time: 10/12/21  1840     History  Chief Complaint  Patient presents with   Hypertension    Carolyn Little is a 76 y.o. female history of difficult to control hypertension, hyperlipidemia here presenting with hypertension and headache.  Patient states that her blood pressure often times go from 130 all the way to about 190.  She states that when her blood pressure is too low she feels lightheaded and dizzy.  She saw her doctor recently and is currently on Diovan and [place].  Patient states that she has been dizzy recently and her blood pressure has been running low so her doctor decreased her Coreg from 25 mg twice a day to 12.5 mg twice a day.  Patient states that for the last several days her blood pressure has been in the 180s in the evening and 140s or 130s in the morning.  Patient states that she has some headache and left arm pain today.  Also has some lower back pain.  Denies any chest pain or shortness of breath  The history is provided by the patient.      Home Medications Prior to Admission medications   Medication Sig Start Date End Date Taking? Authorizing Provider  ACCU-CHEK GUIDE test strip as directed as directed 01/30/21   [provider]  amLODipine (NORVASC) 2.5 MG tablet Take 2.5 mg by mouth daily. 06/25/21   [provider]  atorvastatin (LIPITOR) 40 MG tablet Take 1 tablet (40 mg total) by mouth daily. 10/03/21   Jerline Pain, MD  Blood Glucose Monitoring Suppl (ACCU-CHEK GUIDE) w/Device KIT  02/11/20   [provider]  Blood Glucose Monitoring Suppl (GLUCOCOM BLOOD GLUCOSE MONITOR) DEVI 1 Device by [place].(Non-Drug; Combo Route) route as needed (to check blood glucose). 12/22/20   [provider]  Blood Glucose Monitoring Suppl ([place]) w/Device KIT 1 DEVICE BY MISC.(NON-DRUG COMBO ROUTE) ROUTE AS NEEDED (TO CHECK BLOOD GLUCOSE).  12/22/20   [provider]  brimonidine (ALPHAGAN) 0.2 % ophthalmic solution 3 (three) times daily.    [provider]  carvedilol (COREG) 25 MG tablet Take 1 tablet (25 mg total) by mouth 2 (two) times daily with a meal. 05/31/21   Jerline Pain, MD  Continuous Blood Gluc Receiver (DEXCOM G6 RECEIVER) DEVI Use as directed for continuous glucose monitoring. 03/17/20   [provider]  Continuous Blood Gluc Sensor (DEXCOM G6 SENSOR) MISC Inject 1 sensor to the skin every 10 days for continuous glucose monitoring. 10/05/20   [provider]  Continuous Blood Gluc Transmit (DEXCOM G6 TRANSMITTER) MISC Use as directed for continuous glucose monitoring. Reuse transmitter for 90 days then discard and replace. 10/05/20   [provider]  CVS ASPIRIN ADULT LOW DOSE 81 MG chewable tablet Chew 81 mg by mouth daily. 01/07/20   [provider]  diclofenac Sodium (VOLTAREN) 1 % GEL SMARTSIG:Gram(s) Topical 4 Times Daily 01/28/20   [provider]  dorzolamidel-timolol (COSOPT) 22.3-6.8 MG/ML SOLN ophthalmic solution 1 drop 2 (two) times daily.    [provider]  erythromycin ophthalmic ointment SMARTSIG:1 Sparingly Left Eye Every Evening 01/23/21   [provider]  famotidine (PEPCID) 40 MG tablet Take 40 mg by mouth daily.    [provider]  glucose blood (ONETOUCH VERIO) test strip To monitor sugar 4 times per day 12/22/20   [provider]  glucose blood (PRECISION QID  TEST) test strip To monitor sugar 4 times per day 03/17/20   [provider]  insulin degludec (TRESIBA FLEXTOUCH) 200 UNIT/ML FlexTouch Pen Inject into the skin.    [provider]  Insulin Pen Needle (BD PEN NEEDLE NANO U/F) 32G X 4 MM MISC Use once daily for [place] and once weekly for Trulicity 9/32/67   [provider]  lamoTRIgine (LAMICTAL) 150 MG tablet Take 1 tablet (150 mg total) by mouth every evening. 10/03/21    Penumalli, Earlean Polka, MD  Lancets (ONETOUCH DELICA PLUS [id number]) MISC 1 DEVICE BY MISC.(NON-DRUG COMBO ROUTE) ROUTE 4 TIMES DAILY. 03/07/21   [provider]  levETIRAcetam (KEPPRA) 500 MG tablet Take 1 tablet (500 mg total) by mouth daily. 10/03/21   Penumalli, Earlean Polka, MD  meclizine (ANTIVERT) 25 MG tablet Take 25 mg by mouth 3 (three) times daily as needed for dizziness.    [provider]  metFORMIN (GLUCOPHAGE) 500 MG tablet Take 500 mg by mouth 2 (two) times daily. 12/06/19   [provider]  [facility] VERIO test strip 4 (four) times daily. 03/01/21   [provider]  TOBRADEX ophthalmic ointment SMARTSIG:1 sparingly In Eye(s) Every Night 02/15/21   [provider]  TRULICITY 1.5 PJ/8.2NK SOPN Inject into the skin. 30 units weekly 06/25/21   [provider]  valsartan-hydrochlorothiazide (DIOVAN-HCT) 320-12.5 MG tablet Take 1 tablet by mouth daily. 05/12/20   Jerline Pain, MD  Vitamin D, Ergocalciferol, (DRISDOL) 1.25 MG (50000 UNIT) CAPS capsule Take 50,000 Units by mouth once a week. 01/03/20   [provider]      Allergies    Levaquin [levofloxacin]    Review of Systems   Review of Systems  Neurological:  Positive for headaches.  All other systems reviewed and are negative.  Physical Exam Updated Vital Signs BP (!) 185/72   Pulse 65   Temp 98.2 F (36.8 C) (Oral)   Resp 20   Ht '4\' 11"'  (1.499 m)   Wt 65.8 kg   SpO2 100%   BMI 29.29 kg/m  Physical Exam Vitals and nursing note reviewed.  Constitutional:      Appearance: Normal appearance.  HENT:     Head: Normocephalic.     Nose: Nose normal.     Mouth/Throat:     Mouth: Mucous membranes are moist.  Eyes:     Extraocular Movements: Extraocular movements intact.     Pupils: Pupils are equal, round, and reactive to light.  Cardiovascular:     Rate and Rhythm: Normal rate and regular rhythm.     Pulses: Normal pulses.     Heart sounds: Normal heart sounds.   Pulmonary:     Effort: Pulmonary effort is normal.     Breath sounds: Normal breath sounds.  Abdominal:     General: Abdomen is flat.     Palpations: Abdomen is soft.  Musculoskeletal:        General: Normal range of motion.     Cervical back: Normal range of motion and neck supple.  Skin:    General: Skin is warm.     Capillary Refill: Capillary refill takes less than 2 seconds.  Neurological:     General: No focal deficit present.     Mental Status: She is alert and oriented to person, place, and time.     Comments: Review of nerves II to XII intact.  Patient has normal strength bilateral arms and legs.  Patient has normal gait  Psychiatric:  Mood and Affect: Mood normal.        Behavior: Behavior normal.    ED Results / Procedures / Treatments   Labs (all labs ordered are listed, but only abnormal results are displayed) Labs Reviewed  BASIC METABOLIC PANEL - Abnormal; Notable for the following components:      Result Value   Glucose, Bld 116 (*)    All other components within normal limits  URINALYSIS, ROUTINE W REFLEX MICROSCOPIC - Abnormal; Notable for the following components:   Leukocytes,Ua SMALL (*)    All other components within normal limits  URINALYSIS, MICROSCOPIC (REFLEX) - Abnormal; Notable for the following components:   Bacteria, UA RARE (*)    All other components within normal limits  CBC  TROPONIN I (HIGH SENSITIVITY)  TROPONIN I (HIGH SENSITIVITY)    EKG EKG Interpretation  Date/Time:  Friday Oct 12 2021 19:24:52 EDT Ventricular Rate:  64 PR Interval:  206 QRS Duration: 84 QT Interval:  406 QTC Calculation: 418 R Axis:   62 Text Interpretation: Normal sinus rhythm Septal infarct , age undetermined Abnormal ECG No previous ECGs available Confirmed by Wandra Arthurs [phone]) on 10/12/2021 7:44:29 PM  Radiology CT Head Wo Contrast  Result Date: 10/12/2021 CLINICAL DATA:  Headache, classic migraine EXAM: CT HEAD WITHOUT CONTRAST TECHNIQUE:  Contiguous axial images were obtained from the base of the skull through the vertex without intravenous contrast. RADIATION DOSE REDUCTION: This exam was performed according to the departmental dose-optimization program which includes automated exposure control, adjustment of the mA and/or kV according to patient size and/or use of iterative reconstruction technique. COMPARISON:  None Available. FINDINGS: Brain: There is no acute intracranial hemorrhage, mass effect, or edema. No acute appearing loss of gray-white differentiation. There is encephalomalacia in the left cerebellum extending into the brachium pontis. There is no extra-axial fluid collection. Ventricles and sulci are within normal limits in size and configuration. Vascular: There is atherosclerotic calcification at the skull base. Skull: Left lateral suboccipital craniotomy. Operative changes appear to involve the internal auditory canal. Sinuses/Orbits: No acute finding. Other: Empty sella.  Mastoid air cells are clear. IMPRESSION: No acute intracranial abnormality. Chronic/nonemergent findings detailed above. Electronically Signed   By: Macy Mis M.D.   On: 10/12/2021 20:37    Procedures Procedures    Medications Ordered in ED Medications  metoCLOPramide (REGLAN) injection 10 mg (10 mg Intravenous Not Given 10/12/21 2048)  diphenhydrAMINE (BENADRYL) injection 25 mg (25 mg Intravenous Not Given 10/12/21 2047)  carvedilol (COREG) tablet 25 mg (25 mg Oral Given 10/12/21 2045)    ED Course/ Medical Decision Making/ A&P                           Medical Decision Making Carolyn Little is a 76 y.o. female here presenting with headache and hypertension.  Patient's Coreg dose was decreased to 12.5 mg twice daily from 25 mg twice daily.  I think she likely has symptomatic hypertension.  I considered subarachnoid hemorrhage so we will get CT head.  We will also get CBC and BMP and troponin and urinalysis.  I have low suspicion for ACS.  We will  give migraine cocktail and also give extra dose of her Coreg.  9:49 PM I reviewed patient's labs and interpreted her CT scan independently.  Troponin negative x2.  Patient's CT scan showed no bleed.  BP down to 185 from 200.  I told her that she should increase her nighttime dose  of Coreg to 25 mg.  She should continue to record her blood pressure  Problems Addressed: Hypertension, unspecified type: acute illness or injury Nonintractable headache, unspecified chronicity pattern, unspecified headache type: acute illness or injury  Amount and/or Complexity of Data Reviewed Independent Historian: parent Labs: ordered. Decision-making details documented in ED Course. Radiology: ordered and independent interpretation performed. Decision-making details documented in ED Course. ECG/medicine tests: ordered and independent interpretation performed. Decision-making details documented in ED Course.  Risk Prescription drug management.   Final Clinical Impression(s) / ED Diagnoses Final diagnoses:  None    Rx / DC Orders ED Discharge Orders     None         Drenda Freeze, MD 10/12/21 2151

## 2021-10-12 NOTE — Discharge Instructions (Signed)
Your blood pressure is elevated in the evenings.  I want to increase your evening dose of Coreg to 25 mg.  Please continue your Diovan and your morning dose of Coreg of 12.5 mg.   Continue to keep a record of your blood pressures at home.  Please see your doctor in a week to follow-up  Return to ER if you have worse headache, chest pain, blood pressure over 200

## 2021-10-12 NOTE — ED Triage Notes (Signed)
Pt c/o elevated B/P today with associated HA and L arm pain. Pt denies ShOB or CP.

## 2021-10-16 DIAGNOSIS — E785 Hyperlipidemia, unspecified: Secondary | ICD-10-CM | POA: Diagnosis not present

## 2021-10-16 DIAGNOSIS — I129 Hypertensive chronic kidney disease with stage 1 through stage 4 chronic kidney disease, or unspecified chronic kidney disease: Secondary | ICD-10-CM | POA: Diagnosis not present

## 2021-10-16 DIAGNOSIS — N1831 Chronic kidney disease, stage 3a: Secondary | ICD-10-CM | POA: Diagnosis not present

## 2021-10-16 DIAGNOSIS — E1122 Type 2 diabetes mellitus with diabetic chronic kidney disease: Secondary | ICD-10-CM | POA: Diagnosis not present

## 2021-10-17 DIAGNOSIS — H33321 Round hole, right eye: Secondary | ICD-10-CM | POA: Diagnosis not present

## 2021-10-17 DIAGNOSIS — H43392 Other vitreous opacities, left eye: Secondary | ICD-10-CM | POA: Diagnosis not present

## 2021-10-17 DIAGNOSIS — H43811 Vitreous degeneration, right eye: Secondary | ICD-10-CM | POA: Diagnosis not present

## 2021-10-17 DIAGNOSIS — E113313 Type 2 diabetes mellitus with moderate nonproliferative diabetic retinopathy with macular edema, bilateral: Secondary | ICD-10-CM | POA: Diagnosis not present

## 2021-10-17 DIAGNOSIS — E113312 Type 2 diabetes mellitus with moderate nonproliferative diabetic retinopathy with macular edema, left eye: Secondary | ICD-10-CM | POA: Diagnosis not present

## 2021-10-26 DIAGNOSIS — E1165 Type 2 diabetes mellitus with hyperglycemia: Secondary | ICD-10-CM | POA: Diagnosis not present

## 2021-10-26 DIAGNOSIS — E559 Vitamin D deficiency, unspecified: Secondary | ICD-10-CM | POA: Diagnosis not present

## 2021-10-26 DIAGNOSIS — E782 Mixed hyperlipidemia: Secondary | ICD-10-CM | POA: Diagnosis not present

## 2021-10-26 DIAGNOSIS — K219 Gastro-esophageal reflux disease without esophagitis: Secondary | ICD-10-CM | POA: Diagnosis not present

## 2021-10-26 DIAGNOSIS — Z794 Long term (current) use of insulin: Secondary | ICD-10-CM | POA: Diagnosis not present

## 2021-10-26 DIAGNOSIS — R011 Cardiac murmur, unspecified: Secondary | ICD-10-CM | POA: Diagnosis not present

## 2021-10-26 DIAGNOSIS — I10 Essential (primary) hypertension: Secondary | ICD-10-CM | POA: Diagnosis not present

## 2021-11-09 DIAGNOSIS — Z7984 Long term (current) use of oral hypoglycemic drugs: Secondary | ICD-10-CM | POA: Diagnosis not present

## 2021-11-09 DIAGNOSIS — E1169 Type 2 diabetes mellitus with other specified complication: Secondary | ICD-10-CM | POA: Diagnosis not present

## 2021-11-09 DIAGNOSIS — Z8673 Personal history of transient ischemic attack (TIA), and cerebral infarction without residual deficits: Secondary | ICD-10-CM | POA: Diagnosis not present

## 2021-11-09 DIAGNOSIS — E1129 Type 2 diabetes mellitus with other diabetic kidney complication: Secondary | ICD-10-CM | POA: Diagnosis not present

## 2021-11-09 DIAGNOSIS — E1136 Type 2 diabetes mellitus with diabetic cataract: Secondary | ICD-10-CM | POA: Diagnosis not present

## 2021-11-09 DIAGNOSIS — E1140 Type 2 diabetes mellitus with diabetic neuropathy, unspecified: Secondary | ICD-10-CM | POA: Diagnosis not present

## 2021-11-09 DIAGNOSIS — Z794 Long term (current) use of insulin: Secondary | ICD-10-CM | POA: Diagnosis not present

## 2021-11-09 DIAGNOSIS — E782 Mixed hyperlipidemia: Secondary | ICD-10-CM | POA: Diagnosis not present

## 2021-11-09 DIAGNOSIS — Z7985 Long-term (current) use of injectable non-insulin antidiabetic drugs: Secondary | ICD-10-CM | POA: Diagnosis not present

## 2021-11-09 DIAGNOSIS — I10 Essential (primary) hypertension: Secondary | ICD-10-CM | POA: Diagnosis not present

## 2021-11-09 DIAGNOSIS — E11649 Type 2 diabetes mellitus with hypoglycemia without coma: Secondary | ICD-10-CM | POA: Diagnosis not present

## 2021-11-09 DIAGNOSIS — E1159 Type 2 diabetes mellitus with other circulatory complications: Secondary | ICD-10-CM | POA: Diagnosis not present

## 2021-11-09 DIAGNOSIS — R809 Proteinuria, unspecified: Secondary | ICD-10-CM | POA: Diagnosis not present

## 2021-11-19 DIAGNOSIS — M85851 Other specified disorders of bone density and structure, right thigh: Secondary | ICD-10-CM | POA: Diagnosis not present

## 2021-11-19 DIAGNOSIS — Z780 Asymptomatic menopausal state: Secondary | ICD-10-CM | POA: Diagnosis not present

## 2021-11-19 DIAGNOSIS — Z1231 Encounter for screening mammogram for malignant neoplasm of breast: Secondary | ICD-10-CM | POA: Diagnosis not present

## 2021-11-20 ENCOUNTER — Ambulatory Visit (INDEPENDENT_AMBULATORY_CARE_PROVIDER_SITE_OTHER): Payer: Medicare Other | Admitting: Cardiology

## 2021-11-20 ENCOUNTER — Encounter: Payer: Self-pay | Admitting: Cardiology

## 2021-11-20 VITALS — BP 180/90 | HR 79 | Ht 59.0 in | Wt 150.0 lb

## 2021-11-20 DIAGNOSIS — E119 Type 2 diabetes mellitus without complications: Secondary | ICD-10-CM | POA: Diagnosis not present

## 2021-11-20 DIAGNOSIS — R011 Cardiac murmur, unspecified: Secondary | ICD-10-CM

## 2021-11-20 DIAGNOSIS — I10 Essential (primary) hypertension: Secondary | ICD-10-CM | POA: Diagnosis not present

## 2021-11-20 DIAGNOSIS — I2510 Atherosclerotic heart disease of native coronary artery without angina pectoris: Secondary | ICD-10-CM

## 2021-11-20 MED ORDER — VALSARTAN-HYDROCHLOROTHIAZIDE 80-12.5 MG PO TABS: 1.0000 | ORAL_TABLET | Freq: Every day | ORAL | 3 refills | Status: DC

## 2021-12-03 DIAGNOSIS — E782 Mixed hyperlipidemia: Secondary | ICD-10-CM | POA: Diagnosis not present

## 2021-12-03 DIAGNOSIS — I10 Essential (primary) hypertension: Secondary | ICD-10-CM | POA: Diagnosis not present

## 2021-12-03 DIAGNOSIS — E559 Vitamin D deficiency, unspecified: Secondary | ICD-10-CM | POA: Diagnosis not present

## 2021-12-03 DIAGNOSIS — E1165 Type 2 diabetes mellitus with hyperglycemia: Secondary | ICD-10-CM | POA: Diagnosis not present

## 2021-12-03 DIAGNOSIS — Z794 Long term (current) use of insulin: Secondary | ICD-10-CM | POA: Diagnosis not present

## 2021-12-03 DIAGNOSIS — M1990 Unspecified osteoarthritis, unspecified site: Secondary | ICD-10-CM | POA: Diagnosis not present

## 2021-12-03 DIAGNOSIS — K219 Gastro-esophageal reflux disease without esophagitis: Secondary | ICD-10-CM | POA: Diagnosis not present

## 2021-12-03 DIAGNOSIS — M8589 Other specified disorders of bone density and structure, multiple sites: Secondary | ICD-10-CM | POA: Diagnosis not present

## 2021-12-03 DIAGNOSIS — R011 Cardiac murmur, unspecified: Secondary | ICD-10-CM | POA: Diagnosis not present

## 2021-12-06 ENCOUNTER — Other Ambulatory Visit: Payer: Self-pay | Admitting: Cardiology

## 2021-12-13 ENCOUNTER — Ambulatory Visit (HOSPITAL_COMMUNITY): Payer: Medicare Other | Attending: Internal Medicine

## 2021-12-13 ENCOUNTER — Ambulatory Visit (INDEPENDENT_AMBULATORY_CARE_PROVIDER_SITE_OTHER): Payer: Medicare Other | Admitting: Pharmacist

## 2021-12-13 DIAGNOSIS — I10 Essential (primary) hypertension: Secondary | ICD-10-CM

## 2021-12-13 DIAGNOSIS — E1165 Type 2 diabetes mellitus with hyperglycemia: Secondary | ICD-10-CM | POA: Diagnosis not present

## 2021-12-13 DIAGNOSIS — Z794 Long term (current) use of insulin: Secondary | ICD-10-CM | POA: Diagnosis not present

## 2021-12-13 DIAGNOSIS — E782 Mixed hyperlipidemia: Secondary | ICD-10-CM | POA: Diagnosis not present

## 2021-12-13 DIAGNOSIS — E559 Vitamin D deficiency, unspecified: Secondary | ICD-10-CM | POA: Diagnosis not present

## 2021-12-13 DIAGNOSIS — R011 Cardiac murmur, unspecified: Secondary | ICD-10-CM | POA: Insufficient documentation

## 2021-12-13 DIAGNOSIS — M8589 Other specified disorders of bone density and structure, multiple sites: Secondary | ICD-10-CM | POA: Diagnosis not present

## 2021-12-13 DIAGNOSIS — M1990 Unspecified osteoarthritis, unspecified site: Secondary | ICD-10-CM | POA: Diagnosis not present

## 2021-12-13 DIAGNOSIS — K219 Gastro-esophageal reflux disease without esophagitis: Secondary | ICD-10-CM | POA: Diagnosis not present

## 2021-12-13 DIAGNOSIS — I2510 Atherosclerotic heart disease of native coronary artery without angina pectoris: Secondary | ICD-10-CM | POA: Insufficient documentation

## 2021-12-13 DIAGNOSIS — Z0001 Encounter for general adult medical examination with abnormal findings: Secondary | ICD-10-CM | POA: Diagnosis not present

## 2021-12-13 LAB — ECHOCARDIOGRAM COMPLETE
Area-P 1/2: 2.84 cm2
S' Lateral: 2.1 cm

## 2021-12-13 MED ORDER — CARVEDILOL 25 MG PO TABS: 25.0000 mg | ORAL_TABLET | Freq: Two times a day (BID) | ORAL | 3 refills | Status: DC

## 2021-12-13 NOTE — Progress Notes (Signed)
Patient ID: Carolyn Little                 DOB: 08-23-1945                      MRN: [id number]     HPI: Carolyn Little is a 76 y.o. female referred by Carolyn Little to HTN clinic. PMH is significant for uncontrolled HTN, CAD s/p stenting in 2015, DM, HLD, obesity, seizure disorder, and arthritis. Presented to the ED 10/12/21 with HTN and headache, had reported labile BP ranging 932-355 systolic with higher readings at night. PCP had decreased carvedilol to 12.80m BID due to pt reports of dizziness with low BP. Her PM dose of carvedilol was increased to 229min the ED. Last seen by Carolyn Little/27/23 where BP was elevated at 180/90. Pt had changed her meds and was taking carvedilol 2569mM and 12.5mg38m, and had self discontinued her valsartan-HCTZ and amlodipine. She was restarted on lower dose of valsartan-HCTZ and referred to PharmD. Also has echo scheduled for today and renal artery duplex scheduled next week.  Pt presents today with family member and Spanish interpreter. She is taking her carvedilol 25mg59mand 12.5mg P55mlong with valsartan-HCTZ 80-12.5mg da24m. Reports tolerating meds well, denies dizziness, headache, blurred vision, and LE edema. Reports BP well controlled overall except this AM it was low at 76/43 so she did not take her medications. Since then, her systolic has increased to the 140s when checked later on this morning. Checks BP at home in the AM and PM - 145/67, 106/50, 76/43, 113/56, 99/51, 129/60, 147/69, 164/77, 134/65, 107/54, 111/55, 142/63, 106/49. Generally, BP readings are better in the AM and then increase later in the day, highest readings usually around 11pm. Ex July 12 readings: 115/59 around 8AM, 111/67 4PM, 172/81 11PM. Takes her 2nd carvedilol dose around 5:30pm. Brings in her home bicep cuff today which is measuring pretty well:  Home cuff  - 165/67 Clinic cuff - 170/60  Current HTN meds: valsartan-HCTZ 80-12.5mg dai86m(AM), carvedilol 25mg AM 38m12.5mg PM  B28moal:  <130/80mmHg  Fa7m History: The patient's sister may have died from something heart related, grandfather as well.  Social History: Denies tobacco, alcohol and drug use.  Wt Readings from Last 3 Encounters:  11/20/21 150 lb (68 kg)  10/12/21 145 lb (65.8 kg)  06/26/21 159 lb 6.4 oz (72.3 kg)   BP Readings from Last 3 Encounters:  11/20/21 (!) 180/90  10/12/21 (!) 179/71  06/26/21 (!) 166/81   Pulse Readings from Last 3 Encounters:  11/20/21 79  10/12/21 66  06/26/21 71    Renal function: CrCl cannot be calculated (Patient's most recent lab result is older than the maximum 21 days allowed.).  Past Medical History:  Diagnosis Date   Arthritis    Dizziness    DM (diabetes mellitus) (HCC)    Generalized headaches    Hearing loss    High cholesterol    Hypertension    Low back pain    Obesity    Renal insufficiency    Seizure disorder (HCC)     [facility] Prior to Visit  Medication Sig Dispense Refill   ACCU-CHEK GUIDE test strip as directed as directed     amLODipine (NORVASC) 2.5 MG tablet Take 2.5 mg by mouth daily.     atorvastatin (LIPITOR) 40 MG tablet Take 1 tablet (40 mg total) by mouth daily. 90 tablet 1  Blood Glucose Monitoring Suppl (ACCU-CHEK GUIDE) w/Device KIT      Blood Glucose Monitoring Suppl (GLUCOCOM BLOOD GLUCOSE MONITOR) DEVI 1 Device by Misc.(Non-Drug; Combo Route) route as needed (to check blood glucose).     Blood Glucose Monitoring Suppl (ONETOUCH VERIO FLEX SYSTEM) w/Device KIT 1 DEVICE BY MISC.(NON-DRUG COMBO ROUTE) ROUTE AS NEEDED (TO CHECK BLOOD GLUCOSE).     carvedilol (COREG) 25 MG tablet Take 1 tablet (25 mg total) by mouth 2 (two) times daily with a meal. (Patient taking differently: Take 25 mg by mouth 2 (two) times daily with a meal. Pt takes 40m in the morning and 12.566min the evening.) 180 tablet 3   Continuous Blood Gluc Receiver (DEXCOM G6 RECEIVER) DEVI Use as directed for continuous glucose  monitoring.     Continuous Blood Gluc Sensor (DEXCOM G6 SENSOR) MISC Inject 1 sensor to the skin every 10 days for continuous glucose monitoring.     Continuous Blood Gluc Transmit (DEXCOM G6 TRANSMITTER) MISC Use as directed for continuous glucose monitoring. Reuse transmitter for 90 days then discard and replace.     [facility] ASPIRIN ADULT LOW DOSE 81 MG chewable tablet Chew 81 mg by mouth daily.     diclofenac Sodium (VOLTAREN) 1 % GEL SMARTSIG:Gram(s) Topical 4 Times Daily     dorzolamidel-timolol (COSOPT) 22.3-6.8 MG/ML SOLN ophthalmic solution 1 drop 2 (two) times daily.     erythromycin ophthalmic ointment SMARTSIG:1 Sparingly Left Eye Every Evening     famotidine (PEPCID) 40 MG tablet Take 40 mg by mouth daily.     glucose blood (ONETOUCH VERIO) test strip To monitor sugar 4 times per day     glucose blood (PRECISION QID TEST) test strip To monitor sugar 4 times per day     insulin glargine (LANTUS) 100 UNIT/ML injection Inject 35 Units into the skin daily.     Insulin Pen Needle (BD PEN NEEDLE NANO U/F) 32G X 4 MM MISC Use once daily for [place] once weekly for Trulicity     lamoTRIgine (LAMICTAL) 150 MG tablet Take 1 tablet (150 mg total) by mouth every evening. 90 tablet 4   Lancets (ONETOUCH DELICA PLUS [id number] 1 DEVICE BY MISC.(NON-DRUG COMBO ROUTE) ROUTE 4 TIMES DAILY.     levETIRAcetam (KEPPRA) 500 MG tablet Take 1 tablet (500 mg total) by mouth daily. 90 tablet 4   meclizine (ANTIVERT) 25 MG tablet Take 25 mg by mouth 3 (three) times daily as needed for dizziness.     metFORMIN (GLUCOPHAGE) 500 MG tablet Take 500 mg by mouth 2 (two) times daily.     ONETOUCH VERIO test strip 4 (four) times daily.     TOBRADEX ophthalmic ointment SMARTSIG:1 sparingly In Eye(s) Every Night     TRULICITY 1.5 MGEB/5.8XEOPN Inject into the skin. 30 units weekly     valsartan-hydrochlorothiazide (DIOVAN-HCT) 80-12.5 MG tablet Take 1 tablet by mouth daily. 90 tablet 3   Vitamin D, Ergocalciferol,  (DRISDOL) 1.25 MG (50000 UNIT) CAPS capsule Take 50,000 Units by mouth once a week.     No current facility-administered medications on file prior to visit.    Allergies  Allergen Reactions   Levaquin [Levofloxacin] Hives     Assessment/Plan:  1. Hypertension - BP elevated in clinic today secondary to pt skipping her AM BP meds due to low reading this morning. Has not had other lows and many home BP readings are well controlled, but tend to increase above goal later at night. Will increase 2nd  dose of carvedilol from 12.66m to 275m(was already taking 2554mn the AM). She'll continue on valsartan-HCTZ 80-12.5mg36mily. Will continue to monitor BP at home and f/u in clinic in 4 weeks.  Geetika Laborde E. Nury Nebergall, PharmD, BCACP, CPP [id number]eeDade City [id number]: [phone]: ([phone]/2023 12:12 PM

## 2021-12-13 NOTE — Patient Instructions (Addendum)
Su meta de presin arterial es menos de 130/80  Aumente su dosis vespertina de carvedilol de 12,5 mg a 25 mg  Sus medicamentos para la presin arterial sern: Valsartan-HCTZ 80-12.'5mg'$  - 1 tableta por la maana Carvedilol '25mg'$  - 1 tableta dos veces al [employer] su presin arterial y vuelva a controlarla en la clnica el jueves 62 de agosto a las 10:30 a. m.  -----------------------------------------------------------------------  Your blood pressure goal is less than 130/80  Increase your evening carvedilol dose from 12.'5mg'$  to '25mg'$   Your blood pressure medications will be: valsartan-HCTZ 80-12.'5mg'$  - 1 tablet in the morning Carvedilol '25mg'$  - 1 tablet twice a day  Monitor your blood pressure and recheck in clinic on Thursday, August 17 at 10:30am

## 2021-12-14 DIAGNOSIS — E113312 Type 2 diabetes mellitus with moderate nonproliferative diabetic retinopathy with macular edema, left eye: Secondary | ICD-10-CM | POA: Diagnosis not present

## 2021-12-14 DIAGNOSIS — E113313 Type 2 diabetes mellitus with moderate nonproliferative diabetic retinopathy with macular edema, bilateral: Secondary | ICD-10-CM | POA: Diagnosis not present

## 2021-12-14 DIAGNOSIS — H43392 Other vitreous opacities, left eye: Secondary | ICD-10-CM | POA: Diagnosis not present

## 2021-12-14 DIAGNOSIS — H43811 Vitreous degeneration, right eye: Secondary | ICD-10-CM | POA: Diagnosis not present

## 2021-12-14 DIAGNOSIS — H33321 Round hole, right eye: Secondary | ICD-10-CM | POA: Diagnosis not present

## 2021-12-20 ENCOUNTER — Ambulatory Visit (HOSPITAL_COMMUNITY)
Admission: RE | Admit: 2021-12-20 | Discharge: 2021-12-20 | Disposition: A | Discharge status: Home / Self Care | Payer: Medicare Other | Source: Ambulatory Visit | Attending: Cardiology | Admitting: Cardiology

## 2021-12-20 DIAGNOSIS — I10 Essential (primary) hypertension: Secondary | ICD-10-CM | POA: Diagnosis not present

## 2022-01-09 NOTE — Progress Notes (Signed)
Patient ID: Carolyn Little                 DOB: 1946/05/09                      MRN: [id number]     HPI: Carolyn Little is a 76 y.o. female referred by Dr. Marlou Porch to HTN clinic. PMH is significant for uncontrolled HTN, CAD s/p stenting in 2015, DM, HLD, obesity, seizure disorder, and arthritis. Presented to the ED 10/12/21 with HTN and headache, had reported labile BP ranging 801-655 systolic with higher readings at night. PCP had decreased carvedilol to 12.62m BID due to pt reports of dizziness with low BP. Her PM dose of carvedilol was increased to 228min the ED. Last seen by Dr SkMarlou Porch/27/23 where BP was elevated at 180/90. Pt had changed her meds and was taking carvedilol 2571mM and 12.5mg17m, and had self discontinued her valsartan-HCTZ and amlodipine. She was restarted on lower dose of valsartan-HCTZ and referred to PharmD.   I saw pt on 7/20 where BP was elevated at 170/60 as pt did not take her AM BP medication that day due to single low reading that AM. She had been taking carvedilol 25mg28mthe AM and 12.5mg i73mhe PM and her PM dose was increased to 25mg a11mat visit. Her home BP cuff was measuring relatively accurately, about 5mmHg o74mwith both diastolic and systolic readings. Since last visit, she has had an echo on 12/13/21 which showed mild mitral valve regurgitation and hyperdynamic LV function, EF 70-75%. Renal ultrasound 12/20/21 was negative for renal artery stenosis.  Pt presents today with family member and Spanish interpreter. Reports tolerating meds well. Brings in list of BP readings on her phone which are all over the place. As low as 80s/50s and as high as 170s/80s. Pt feels tired and weak when SBP is in the 90s or below. Skips her medication when this happens. Had lows in the 80s/50s this past Saturday but aside from that reports taking her medications as prescribed in the past week. Difficult to determine when she's taking her medications compared to when her readings are checked,  often times when she skips a dose and then rechecks her BP, it's up into the 150s and she'll take her meds then which is causing large variability in her readings. Recent readings from the past day or two include: 110/60, 117/52, 151/75, 118/61, 116/61, 96/58, 137/68  Brings in some of her home medication, has been taking [facility] Atorvastatin has been sent to mail order since last year which she is supposed to be taking instead.  Current HTN meds: valsartan-HCTZ 80-12.5mg daily61mM), carvedilol 25mg BID  41moal: <130/80mmHg  Fam64mHistory: The patient's sister may have died from something heart related, grandfather as well.  Social History: Denies tobacco, alcohol and drug use.  Wt Readings from Last 3 Encounters:  11/20/21 150 lb (68 kg)  10/12/21 145 lb (65.8 kg)  06/26/21 159 lb 6.4 oz (72.3 kg)   BP Readings from Last 3 Encounters:  12/13/21 (!) 170/60  11/20/21 (!) 180/90  10/12/21 (!) 179/71   Pulse Readings from Last 3 Encounters:  12/13/21 71  11/20/21 79  10/12/21 66    Renal function: CrCl cannot be calculated (Patient's most recent lab result is older than the maximum 21 days allowed.).  Past Medical History:  Diagnosis Date   Arthritis    Dizziness  DM (diabetes mellitus) (HCC)    Generalized headaches    Hearing loss    High cholesterol    Hypertension    Low back pain    Obesity    Renal insufficiency    Seizure disorder ([place])     Current Outpatient Medications on File Prior to Visit  Medication Sig Dispense Refill   ACCU-CHEK GUIDE test strip as directed as directed     atorvastatin (LIPITOR) 40 MG tablet Take 1 tablet (40 mg total) by mouth daily. 90 tablet 1   Blood Glucose Monitoring Suppl (ACCU-CHEK GUIDE) w/Device KIT      Blood Glucose Monitoring Suppl (GLUCOCOM BLOOD GLUCOSE MONITOR) DEVI 1 Device by Misc.(Non-Drug; Combo Route) route as needed (to check blood glucose).     Blood Glucose Monitoring Suppl  (ONETOUCH VERIO FLEX SYSTEM) w/Device KIT 1 DEVICE BY MISC.(NON-DRUG COMBO ROUTE) ROUTE AS NEEDED (TO CHECK BLOOD GLUCOSE).     carvedilol (COREG) 25 MG tablet Take 1 tablet (25 mg total) by mouth 2 (two) times daily with a meal. 180 tablet 3   Continuous Blood Gluc Receiver (DEXCOM G6 RECEIVER) DEVI Use as directed for continuous glucose monitoring.     Continuous Blood Gluc Sensor (DEXCOM G6 SENSOR) MISC Inject 1 sensor to the skin every 10 days for continuous glucose monitoring.     Continuous Blood Gluc Transmit (DEXCOM G6 TRANSMITTER) MISC Use as directed for continuous glucose monitoring. Reuse transmitter for 90 days then discard and replace.     [facility] ASPIRIN ADULT LOW DOSE 81 MG chewable tablet Chew 81 mg by mouth daily.     diclofenac Sodium (VOLTAREN) 1 % GEL SMARTSIG:Gram(s) Topical 4 Times Daily     dorzolamidel-timolol (COSOPT) 22.3-6.8 MG/ML SOLN ophthalmic solution 1 drop 2 (two) times daily.     erythromycin ophthalmic ointment SMARTSIG:1 Sparingly Left Eye Every Evening     famotidine (PEPCID) 40 MG tablet Take 40 mg by mouth daily.     glucose blood (ONETOUCH VERIO) test strip To monitor sugar 4 times per day     glucose blood (PRECISION QID TEST) test strip To monitor sugar 4 times per day     insulin glargine (LANTUS) 100 UNIT/ML injection Inject 35 Units into the skin daily.     Insulin Pen Needle (BD PEN NEEDLE NANO U/F) 32G X 4 MM MISC Use once daily for [place] and once weekly for Trulicity     lamoTRIgine (LAMICTAL) 150 MG tablet Take 1 tablet (150 mg total) by mouth every evening. 90 tablet 4   Lancets (ONETOUCH DELICA PLUS [id number]) MISC 1 DEVICE BY MISC.(NON-DRUG COMBO ROUTE) ROUTE 4 TIMES DAILY.     levETIRAcetam (KEPPRA) 500 MG tablet Take 1 tablet (500 mg total) by mouth daily. 90 tablet 4   meclizine (ANTIVERT) 25 MG tablet Take 25 mg by mouth 3 (three) times daily as needed for dizziness.     metFORMIN (GLUCOPHAGE) 500 MG tablet Take 500 mg by mouth 2 (two) times  daily.     ONETOUCH VERIO test strip 4 (four) times daily.     TOBRADEX ophthalmic ointment SMARTSIG:1 sparingly In Eye(s) Every Night     TRULICITY 1.5 HM/0.9OB SOPN Inject into the skin. 30 units weekly     valsartan-hydrochlorothiazide (DIOVAN-HCT) 80-12.5 MG tablet Take 1 tablet by mouth daily. 90 tablet 3   Vitamin D, Ergocalciferol, (DRISDOL) 1.25 MG (50000 UNIT) CAPS capsule Take 50,000 Units by mouth once a week.     No current facility-administered medications on file  prior to visit.    Allergies  Allergen Reactions   Levaquin [Levofloxacin] Hives     Assessment/Plan:  1. Hypertension - BP mildly elevated in office today above goal < 130/77mHg, home readings erratic and range 80s/50s-170s/80s. Difficult to determine appropriate BP regimen as pt will skip or delay doses depending on her BP readings. Will try moving her valsartan-HCTZ 80-12.534mdaily to lunch time in case and continue carvedilol 2537mID in case taking both meds in the AM is causing her BP to drop low occasionally. Advised pt she can skip/delay dosing of BP med if her systolic is < 110737nd take her normal dose if SBP is > 110. She'll continue to monitor BP at home and will follow up in 1 month. Changing to Toprol may be helpful in the future if readings remain erratic - would have less effect on BP but 24 hour long effects may be helpful for BP stability if she finds spacing her carvedilol 12 hours apart is difficult depending on readings at home.   2. Hyperlipidemia - Pt has still been taking simvastatin despite being prescribed atorvastatin. [facility] and [facility]atin was being [facility] to [facility] order. [facility] to have [facility] and sent in a refill of atorvastatin to her mail order.  Marianita Botkin E. Nekeya Briski, PharmD, BCACP, CPPNew Pine Creek[address]reLeisure KnollC [id number]: [phone]: [phone]/2023 4:17 PM

## 2022-01-10 ENCOUNTER — Ambulatory Visit (INDEPENDENT_AMBULATORY_CARE_PROVIDER_SITE_OTHER): Payer: Medicare Other | Admitting: Pharmacist

## 2022-01-10 VITALS — BP 140/62 | HR 72

## 2022-01-10 DIAGNOSIS — I10 Essential (primary) hypertension: Secondary | ICD-10-CM

## 2022-01-10 MED ORDER — ATORVASTATIN CALCIUM 40 MG PO TABS: 40.0000 mg | ORAL_TABLET | Freq: Every day | ORAL | 3 refills | Status: DC

## 2022-01-10 NOTE — Patient Instructions (Addendum)
Su objetivo de presin arterial es < 130/80  Mueva su valsartan-HCTZ a la hora del [employer] su carvedilol por la maana y por la noche, con aproximadamente 12 horas de diferencia  Si su presin arterial de arriba es < 110, puede dejar de tomar su medicamento para la presin arterial en ese momento hasta que suba por encima de 110  Vuelva a controlar la presin arterial en 1 mes en el clinic  Para su colesterol, deje de tomar simvastatina y comience a tomar 1 comprimido de Engineer, manufacturing.     Your blood pressure goal is < 130/80  Move your valsartan-HCTZ to lunch time  Keep your carvedilol in the morning and at night, about 12 hours apart  If your top blood pressure is < 110, you can hold off on taking your blood pressure medication at that time until it rises above 110  Recheck blood pressure in 1 month in office  For your cholesterol, stop taking simvastatin and start taking atorvastatin 1 tablet daily

## 2022-02-12 ENCOUNTER — Ambulatory Visit: Payer: Medicare Other | Attending: Interventional Cardiology | Admitting: Pharmacist

## 2022-02-12 VITALS — BP 162/82 | HR 66

## 2022-02-12 DIAGNOSIS — I10 Essential (primary) hypertension: Secondary | ICD-10-CM | POA: Diagnosis not present

## 2022-02-12 NOTE — Progress Notes (Unsigned)
Patient ID: Carolyn Little                 DOB: 03/21/1946                      MRN: [id number]     HPI: Carolyn Little is a 76 y.o. female referred by Dr. Marlou Porch to HTN clinic. PMH is significant for uncontrolled HTN, CAD s/p stenting in 2015, DM, HLD, obesity, seizure disorder, and arthritis. Presented to the ED 10/12/21 with HTN and headache, had reported labile BP ranging 858-850 systolic with higher readings at night. PCP had decreased carvedilol to 12.60m BID due to pt reports of dizziness with low BP. Her PM dose of carvedilol was increased to 237min the ED. Last seen by Dr SkMarlou Porch/27/23 where BP was elevated at 180/90. Pt had changed her meds and was taking carvedilol 2516mM and 12.5mg67m, and had self discontinued her valsartan-HCTZ and amlodipine. She was restarted on lower dose of valsartan-HCTZ and referred to PharmD.   Pt seen on 7/20 where BP was elevated at 170/60 as pt did not take her AM BP medication that day due to single low reading that AM. She had been taking carvedilol 25mg63mthe AM and 12.5mg i82mhe PM and her PM dose was increased to 25mg a11mat visit. Her home BP cuff was measuring relatively accurately, about 5mmHg o40mwith both diastolic and systolic readings. Since last visit, she has had an echo on 12/13/21 which showed mild mitral valve regurgitation and hyperdynamic LV function, EF 70-75%. Renal ultrasound 12/20/21 was negative for renal artery stenosis.  At last HTN clinic visit, blood pressures were erratic. Patient skipping doses when BP drops low. Valsartan/HCTZ was moved to lunch time. Patient advised to skip/delay dosing of BP med if her systolic is < 110, and277ke her normal dose if SBP is > 110.   Patient presents today accompanied by her daughter and Spanish interpreter. Patient brings in a list of blood pressures. They are widely variable. She is also have several low blood pressures. There was some confusion about what doses of the medications she was taking, but  daughter called after visit and confirmed she was taking what we thought she was taking. She does not take her valsartan/HCTZ if her blood pressure is low. She will eat salt or drink some wine to bring it up. Has dropped to 80's and41'Os sys87'Oc several times. She will wait for her blood pressure to come up to the 140's before she takes her valsartan/hctz. She feels fine when her BP is 110 syst676c but feels weak when it drops into the 90's or lower. It seems like it drops in the AM around 9-11AM mostly but not always. Takes her carvedilol around 10:00-10:30. She has not taken her valsartan/HCTZ this AM yet. BP in clinic is elevated. She never completely skips the dose of valsartan, just takes it later in the day.   166/77, 121/57, 131/67, 114/57, 126/61, 156/74, 123/60, 149/75, 87/44, 144/75, 73/42, 141/71, 184/92, 143/67, 137/66, 135/59    Current HTN meds: valsartan-HCTZ 80-12.5mg dail8mAM), carvedilol 25mg BID 29mgoal: <130/80mmHg  Fa41m History: The patient's sister may have died from something heart related, grandfather as well.  Social History: Denies tobacco, alcohol and drug use.  Wt Readings from Last 3 Encounters:  11/20/21 150 lb (68 kg)  10/12/21 145 lb (65.8 kg)  06/26/21 159 lb 6.4 oz (72.3 kg)   BP Readings from  Last 3 Encounters:  01/10/22 (!) 140/62  12/13/21 (!) 170/60  11/20/21 (!) 180/90   Pulse Readings from Last 3 Encounters:  01/10/22 72  12/13/21 71  11/20/21 79    Renal function: CrCl cannot be calculated (Patient's most recent lab result is older than the maximum 21 days allowed.).  Past Medical History:  Diagnosis Date   Arthritis    Dizziness    DM (diabetes mellitus) (HCC)    Generalized headaches    Hearing loss    High cholesterol    Hypertension    Low back pain    Obesity    Renal insufficiency    Seizure disorder ([place])     Current Outpatient Medications on File Prior to Visit  Medication Sig Dispense Refill   ACCU-CHEK GUIDE test  strip as directed as directed     atorvastatin (LIPITOR) 40 MG tablet Take 1 tablet (40 mg total) by mouth daily. 90 tablet 3   Blood Glucose Monitoring Suppl (ACCU-CHEK GUIDE) w/Device KIT      Blood Glucose Monitoring Suppl (GLUCOCOM BLOOD GLUCOSE MONITOR) DEVI 1 Device by Misc.(Non-Drug; Combo Route) route as needed (to check blood glucose).     Blood Glucose Monitoring Suppl (ONETOUCH VERIO FLEX SYSTEM) w/Device KIT 1 DEVICE BY MISC.(NON-DRUG COMBO ROUTE) ROUTE AS NEEDED (TO CHECK BLOOD GLUCOSE).     carvedilol (COREG) 25 MG tablet Take 1 tablet (25 mg total) by mouth 2 (two) times daily with a meal. 180 tablet 3   Continuous Blood Gluc Receiver (DEXCOM G6 RECEIVER) DEVI Use as directed for continuous glucose monitoring.     Continuous Blood Gluc Sensor (DEXCOM G6 SENSOR) MISC Inject 1 sensor to the skin every 10 days for continuous glucose monitoring.     Continuous Blood Gluc Transmit (DEXCOM G6 TRANSMITTER) MISC Use as directed for continuous glucose monitoring. Reuse transmitter for 90 days then discard and replace.     [facility] ASPIRIN ADULT LOW DOSE 81 MG chewable tablet Chew 81 mg by mouth daily.     diclofenac Sodium (VOLTAREN) 1 % GEL SMARTSIG:Gram(s) Topical 4 Times Daily     dorzolamidel-timolol (COSOPT) 22.3-6.8 MG/ML SOLN ophthalmic solution 1 drop 2 (two) times daily.     erythromycin ophthalmic ointment SMARTSIG:1 Sparingly Left Eye Every Evening     famotidine (PEPCID) 40 MG tablet Take 40 mg by mouth daily.     glucose blood (ONETOUCH VERIO) test strip To monitor sugar 4 times per day     glucose blood (PRECISION QID TEST) test strip To monitor sugar 4 times per day     insulin glargine (LANTUS) 100 UNIT/ML injection Inject 35 Units into the skin daily.     Insulin Pen Needle (BD PEN NEEDLE NANO U/F) 32G X 4 MM MISC Use once daily for [place] and once weekly for Trulicity     lamoTRIgine (LAMICTAL) 150 MG tablet Take 1 tablet (150 mg total) by mouth every evening. 90 tablet 4    Lancets (ONETOUCH DELICA PLUS [id number]) MISC 1 DEVICE BY MISC.(NON-DRUG COMBO ROUTE) ROUTE 4 TIMES DAILY.     levETIRAcetam (KEPPRA) 500 MG tablet Take 1 tablet (500 mg total) by mouth daily. 90 tablet 4   meclizine (ANTIVERT) 25 MG tablet Take 25 mg by mouth 3 (three) times daily as needed for dizziness.     metFORMIN (GLUCOPHAGE) 500 MG tablet Take 500 mg by mouth 2 (two) times daily.     ONETOUCH VERIO test strip 4 (four) times daily.     TOBRADEX  ophthalmic ointment SMARTSIG:1 sparingly In Eye(s) Every Night     TRULICITY 1.5 HY/0.7PX SOPN Inject into the skin. 30 units weekly     valsartan-hydrochlorothiazide (DIOVAN-HCT) 80-12.5 MG tablet Take 1 tablet by mouth daily. 90 tablet 3   Vitamin D, Ergocalciferol, (DRISDOL) 1.25 MG (50000 UNIT) CAPS capsule Take 50,000 Units by mouth once a week.     No current facility-administered medications on file prior to visit.    Allergies  Allergen Reactions   Levaquin [Levofloxacin] Hives     Assessment/Plan:  1. Hypertension - BP  elevated in office today above goal < 130/36mHg, she has not taken her valsartan/hctz today yet, due to lower BP this AM. Home readings erratic and range 80s/50s-170s/80s. Difficult to determine appropriate BP regimen as pt will skip or delay doses depending on her BP readings. But her BP does drop too often. Will try decreasing carvedilol to 12.584mBID. Daughter was made aware on phone ([place] Continue valsartan 80/12.27m67maily. Follow up in 4 weeks in clinic.   MelRamond Dialharm.D, BCPS, CPP [facility]2[place]re[place]C [id number]: [phone]: ([phone]/19/2023 8:00 AM

## 2022-02-12 NOTE — Patient Instructions (Addendum)
Please call me to review the dose of medications you are taking [phone]

## 2022-02-22 DIAGNOSIS — E113313 Type 2 diabetes mellitus with moderate nonproliferative diabetic retinopathy with macular edema, bilateral: Secondary | ICD-10-CM | POA: Diagnosis not present

## 2022-02-22 DIAGNOSIS — H43392 Other vitreous opacities, left eye: Secondary | ICD-10-CM | POA: Diagnosis not present

## 2022-02-22 DIAGNOSIS — H33321 Round hole, right eye: Secondary | ICD-10-CM | POA: Diagnosis not present

## 2022-02-22 DIAGNOSIS — H43811 Vitreous degeneration, right eye: Secondary | ICD-10-CM | POA: Diagnosis not present

## 2022-02-22 DIAGNOSIS — E113312 Type 2 diabetes mellitus with moderate nonproliferative diabetic retinopathy with macular edema, left eye: Secondary | ICD-10-CM | POA: Diagnosis not present

## 2022-03-22 DIAGNOSIS — E1165 Type 2 diabetes mellitus with hyperglycemia: Secondary | ICD-10-CM | POA: Diagnosis not present

## 2022-03-22 DIAGNOSIS — Z794 Long term (current) use of insulin: Secondary | ICD-10-CM | POA: Diagnosis not present

## 2022-03-22 DIAGNOSIS — Z0001 Encounter for general adult medical examination with abnormal findings: Secondary | ICD-10-CM | POA: Diagnosis not present

## 2022-03-22 DIAGNOSIS — M1990 Unspecified osteoarthritis, unspecified site: Secondary | ICD-10-CM | POA: Diagnosis not present

## 2022-03-22 DIAGNOSIS — K219 Gastro-esophageal reflux disease without esophagitis: Secondary | ICD-10-CM | POA: Diagnosis not present

## 2022-03-22 DIAGNOSIS — Z23 Encounter for immunization: Secondary | ICD-10-CM | POA: Diagnosis not present

## 2022-03-22 DIAGNOSIS — E559 Vitamin D deficiency, unspecified: Secondary | ICD-10-CM | POA: Diagnosis not present

## 2022-03-22 DIAGNOSIS — E782 Mixed hyperlipidemia: Secondary | ICD-10-CM | POA: Diagnosis not present

## 2022-03-22 DIAGNOSIS — I10 Essential (primary) hypertension: Secondary | ICD-10-CM | POA: Diagnosis not present

## 2022-03-22 DIAGNOSIS — R011 Cardiac murmur, unspecified: Secondary | ICD-10-CM | POA: Diagnosis not present

## 2022-03-22 DIAGNOSIS — M8589 Other specified disorders of bone density and structure, multiple sites: Secondary | ICD-10-CM | POA: Diagnosis not present

## 2022-03-25 ENCOUNTER — Ambulatory Visit: Payer: Medicare Other | Attending: Cardiovascular Disease | Admitting: Pharmacist

## 2022-03-25 VITALS — BP 154/60 | HR 67

## 2022-03-25 DIAGNOSIS — I10 Essential (primary) hypertension: Secondary | ICD-10-CM | POA: Diagnosis not present

## 2022-03-25 MED ORDER — AMLODIPINE BESYLATE 2.5 MG PO TABS
2.5000 mg | ORAL_TABLET | Freq: Every day | ORAL | 3 refills | Status: DC
Start: 2022-03-25 — End: 2022-05-09

## 2022-03-25 NOTE — Patient Instructions (Addendum)
Stop taking valsartan/hydrochlorothiazide Start taking amlodipine 2.'5mg'$  daily Continue taking carvedilol 12.'5mg'$  twice a day (12 hours a part)  Please call me in 1 week if blood pressure remain high, or you have a lot of low blood pressures [phone]   Deje de tomar valsartn/hidroclorotiazida Empiece a tomar amlodipino 2,5 mg al da. Contine tomando carvedilol 12,5 mg [place] (12 horas cada [place]).  Llmeme en 1 semana si la presin arterial sigue alta o si tiene mucha presin arterial baja. [phone]

## 2022-03-25 NOTE — Progress Notes (Signed)
Patient ID: Carolyn Little                 DOB: 04-07-1946                      MRN: [id number]     HPI: Carolyn Little is a 76 y.o. female referred by Dr. Marlou Porch to HTN clinic. PMH is significant for uncontrolled HTN, CAD s/p stenting in 2015, DM, HLD, obesity, seizure disorder, and arthritis. Presented to the ED 10/12/21 with HTN and headache, had reported labile BP ranging 035-009 systolic with higher readings at night. PCP had decreased carvedilol to 12.18m BID due to pt reports of dizziness with low BP. Her PM dose of carvedilol was increased to 28min the ED. Last seen by Dr SkMarlou Porch/27/23 where BP was elevated at 180/90. Pt had changed her meds and was taking carvedilol 2516mM and 12.5mg15m, and had self discontinued her valsartan-HCTZ and amlodipine. She was restarted on lower dose of valsartan-HCTZ and referred to PharmD.   Pt seen on 7/20 where BP was elevated at 170/60 as pt did not take her AM BP medication that day due to single low reading that AM. She had been taking carvedilol 25mg26mthe AM and 12.5mg i19mhe PM and her PM dose was increased to 25mg a28mat visit. Her home BP cuff was measuring relatively accurately, about 5mmHg o68mwith both diastolic and systolic readings. Since last visit, she has had an echo on 12/13/21 which showed mild mitral valve regurgitation and hyperdynamic LV function, EF 70-75%. Renal ultrasound 12/20/21 was negative for renal artery stenosis.  At last previous visits blood pressures were erratic. Patient skipping doses when BP drops low. Valsartan/HCTZ was moved to lunch time. Patient advised to skip/delay dosing of BP med if her systolic is < 110, and381ke her normal dose if SBP is > 110.   At last visit, carvedilol was reduced to 12.5mg twic38m day due to several low blood pressures.   Patient presents today accompanied by her daughter who served as interpreter. Patient brings in a list of blood pressures. They are still widely variable. She reports that the  only time she skips her medications is if BP is in the 80's or lower. Typically she takes her AM blood pressure medications around 9:45 AM. She admitted that she started taking her second carvedilol dose earlier (7PM). She does have arthritis- neck and shoulder pain. Takes Advil only when pain  is so bad she cant take it. Once every few weeks. Does not sleep well. Wakes up often to see and has a hard time going back to sleep. Home cuff previously checked Home cuff  - 165/67 Clinic cuff - 170/60  Daughter and patient frustrated about blood pressure. Said PCP told them to tell [place]. I asked if checking blood pressure that often makes her anxious. She said no. Advised she did not need to check as often, but patient wants to. Patient states her blood pressure was fine when she was on the brand name before she moved her 2 years ago.   Home BP readings: 135/61, 178/80 (7:18 AM), 101/51 (11:40 PM), 140/67 (PM), 152/62 6:20 PM, 112/51 1:30 PM, 117/62 9:48 AM 154/71 PM, 156/72, 139/64, 121/56, 118/62, 69/64 108/56 (4PM), 82/47 9AM 164/83 (8:12AM) 183/83 (9:25 PM), 104/60 (2:06 PM) 91/46 12:16 PM, 116/61 10:13 AM   Current HTN meds: valsartan-HCTZ 80-12.5mg daily68m:45AM-11), carvedilol 12.5mg twice 64m  day (7 PM)  BP goal: <130/43mHg  Family History: The patient's sister may have died from something heart related, grandfather as well.  Social History: Denies tobacco, alcohol and drug use.  Wt Readings from Last 3 Encounters:  11/20/21 150 lb (68 kg)  10/12/21 145 lb (65.8 kg)  06/26/21 159 lb 6.4 oz (72.3 kg)   BP Readings from Last 3 Encounters:  02/12/22 (!) 162/82  01/10/22 (!) 140/62  12/13/21 (!) 170/60   Pulse Readings from Last 3 Encounters:  02/12/22 66  01/10/22 72  12/13/21 71    Renal function: CrCl cannot be calculated (Patient's most recent lab result is older than the maximum 21 days allowed.).  Past Medical History:  Diagnosis Date    Arthritis    Dizziness    DM (diabetes mellitus) (HCC)    Generalized headaches    Hearing loss    High cholesterol    Hypertension    Low back pain    Obesity    Renal insufficiency    [place]     Current Outpatient Medications on File Prior to Visit  Medication Sig Dispense Refill   ACCU-CHEK GUIDE test strip as directed as directed     atorvastatin (LIPITOR) 40 MG tablet Take 1 tablet (40 mg total) by mouth daily. 90 tablet 3   Blood Glucose Monitoring Suppl (ACCU-CHEK GUIDE) w/Device KIT      Blood Glucose Monitoring Suppl (GLUCOCOM BLOOD GLUCOSE MONITOR) DEVI 1 Device by Misc.(Non-Drug; Combo Route) route as needed (to check blood glucose).     Blood Glucose Monitoring Suppl (ONETOUCH VERIO FLEX SYSTEM) w/Device KIT 1 DEVICE BY MISC.(NON-DRUG COMBO ROUTE) ROUTE AS NEEDED (TO CHECK BLOOD GLUCOSE).     carvedilol (COREG) 25 MG tablet Take 1 tablet (25 mg total) by mouth 2 (two) times daily with a meal. 180 tablet 3   Continuous Blood Gluc Receiver (DEXCOM G6 RECEIVER) DEVI Use as directed for continuous glucose monitoring.     Continuous Blood Gluc Sensor (DEXCOM G6 SENSOR) MISC Inject 1 sensor to the skin every 10 days for continuous glucose monitoring.     Continuous Blood Gluc Transmit (DEXCOM G6 TRANSMITTER) MISC Use as directed for continuous glucose monitoring. Reuse transmitter for 90 days then discard and replace.     [facility] ASPIRIN ADULT LOW DOSE 81 MG chewable tablet Chew 81 mg by mouth daily.     diclofenac Sodium (VOLTAREN) 1 % GEL SMARTSIG:Gram(s) Topical 4 Times Daily     dorzolamidel-timolol (COSOPT) 22.3-6.8 MG/ML SOLN ophthalmic solution 1 drop 2 (two) times daily.     erythromycin ophthalmic ointment SMARTSIG:1 Sparingly Left Eye Every Evening     famotidine (PEPCID) 40 MG tablet Take 40 mg by mouth daily.     glucose blood (ONETOUCH VERIO) test strip To monitor sugar 4 times per day     glucose blood (PRECISION QID TEST) test strip To monitor sugar 4  times per day     insulin glargine (LANTUS) 100 UNIT/ML injection Inject 35 Units into the skin daily.     Insulin Pen Needle (BD PEN NEEDLE NANO U/F) 32G X 4 MM MISC Use once daily for [place] once weekly for Trulicity     lamoTRIgine (LAMICTAL) 150 MG tablet Take 1 tablet (150 mg total) by mouth every evening. 90 tablet 4   Lancets (ONETOUCH DELICA PLUS [id number] MISC 1 DEVICE BY MISC.(NON-DRUG COMBO ROUTE) ROUTE 4 TIMES DAILY.     levETIRAcetam (KEPPRA) 500 MG tablet Take 1 tablet (  500 mg total) by mouth daily. 90 tablet 4   meclizine (ANTIVERT) 25 MG tablet Take 25 mg by mouth 3 (three) times daily as needed for dizziness.     metFORMIN (GLUCOPHAGE) 500 MG tablet Take 500 mg by mouth 2 (two) times daily.     ONETOUCH VERIO test strip 4 (four) times daily.     TOBRADEX ophthalmic ointment SMARTSIG:1 sparingly In Eye(s) Every Night     TRULICITY 1.5 VJ/2.8AS SOPN Inject into the skin. 30 units weekly     valsartan-hydrochlorothiazide (DIOVAN-HCT) 80-12.5 MG tablet Take 1 tablet by mouth daily. 90 tablet 3   Vitamin D, Ergocalciferol, (DRISDOL) 1.25 MG (50000 UNIT) CAPS capsule Take 50,000 Units by mouth once a week.     No current facility-administered medications on file prior to visit.    Allergies  Allergen Reactions   Levaquin [Levofloxacin] Hives     Assessment/Plan:  1. Hypertension - BP elevated in office today above goal < 130/80 mmHg. She is still experiencing highs and lows at home. Denies getting anxious from checking. No pattern as to when highs and lows occur. Will probably need to sacrifice some higher blood pressures to avoid the lows. She was previously on valsartan/hctz, amlodipine and carvedilol and was getting dizzy. I explained to patient and daughter that we are the experts and it will take some educated trial and error to find the best regimen for her. They want to see the doctor. I advised that I can put in a referral for the advanced HTN clinic to see Dr.  Oval Linsey but that she also utilizes the pharmacists too. Will try stopping valsartan/hctz and starting amlodipine 2.76m daily. Continue carvedilol 12.533mtwice a day. Advised to call me in a week if BP is too high or low. Follow up in clinic next available 12/12. They can call me in the meantime with any issues. Educated to try to do some chair yoga and move as much as possible, limit NSAIDs which it sounds like patient is.   MeRamond DialPharm.D, BCPS, CPP [facility] [address]Phone: [phone]: [phone] 03/25/2022 9:50 AM

## 2022-03-26 DIAGNOSIS — M542 Cervicalgia: Secondary | ICD-10-CM | POA: Diagnosis not present

## 2022-03-26 DIAGNOSIS — M25511 Pain in right shoulder: Secondary | ICD-10-CM | POA: Diagnosis not present

## 2022-04-01 DIAGNOSIS — E113313 Type 2 diabetes mellitus with moderate nonproliferative diabetic retinopathy with macular edema, bilateral: Secondary | ICD-10-CM | POA: Diagnosis not present

## 2022-04-01 DIAGNOSIS — Z961 Presence of intraocular lens: Secondary | ICD-10-CM | POA: Diagnosis not present

## 2022-04-01 DIAGNOSIS — H40013 Open angle with borderline findings, low risk, bilateral: Secondary | ICD-10-CM | POA: Diagnosis not present

## 2022-04-15 ENCOUNTER — Other Ambulatory Visit: Payer: Self-pay | Admitting: Diagnostic Neuroimaging

## 2022-05-07 ENCOUNTER — Ambulatory Visit: Payer: Medicare Other | Attending: Cardiovascular Disease | Admitting: Pharmacist

## 2022-05-07 VITALS — BP 152/64 | HR 69

## 2022-05-07 DIAGNOSIS — I10 Essential (primary) hypertension: Secondary | ICD-10-CM | POA: Diagnosis not present

## 2022-05-07 NOTE — Patient Instructions (Addendum)
Continue amlodipine 2.'5mg'$  daily 9:30 AM, carvedilol 12.'5mg'$  twice a day  Please call me at [phone] with any problems  Goal blood pressure <140/90 - ok to occasionally go over this. Please call me if it starts consistently running higher or if you start having several low blood pressures.

## 2022-05-07 NOTE — Progress Notes (Signed)
Patient ID: Carolyn Little                 DOB: Jul 04, 1945                      MRN: [id number]     HPI: Carolyn Little is a 76 y.o. female referred by Dr. Marlou Porch to HTN clinic. PMH is significant for uncontrolled HTN, CAD s/p stenting in 2015, DM, HLD, obesity, seizure disorder, and arthritis. Presented to the ED 10/12/21 with HTN and headache, had reported labile BP ranging 390-300 systolic with higher readings at night. PCP had decreased carvedilol to 12.5m BID due to pt reports of dizziness with low BP. Her PM dose of carvedilol was increased to 261min the ED. Last seen by Dr SkMarlou Porch/27/23 where BP was elevated at 180/90. Pt had changed her meds and was taking carvedilol 252mM and 12.5mg38m, and had self discontinued her valsartan-HCTZ and amlodipine. She was restarted on lower dose of valsartan-HCTZ and referred to PharmD.   Pt seen on 7/20 where BP was elevated at 170/60 as pt did not take her AM BP medication that day due to single low reading that AM. She had been taking carvedilol 25mg43mthe AM and 12.5mg i103mhe PM and her PM dose was increased to 25mg a54mat visit. Her home BP cuff was measuring relatively accurately, about 5mmHg o27mwith both diastolic and systolic readings. Since last visit, she has had an echo on 12/13/21 which showed mild mitral valve regurgitation and hyperdynamic LV function, EF 70-75%. Renal ultrasound 12/20/21 was negative for renal artery stenosis.  At last previous visits blood pressures were erratic. Patient skipping doses when BP drops low. Valsartan/HCTZ was moved to lunch time. Patient advised to skip/delay dosing of BP med if her systolic is < 110, and923ke her normal dose if SBP is > 110.   At visit on 9/19, carvedilol was reduced to 12.5mg twic49m day due to several low blood pressures. Not much improvement seen. At visit 10/30, valsartan/HCTZ was stopped and amlodipine 2.5mg daily65ms started.  She presents today with her daughter and interrupter. Brings in  her blood pressure monitor with memory of readings since 11/14. Her blood pressures range from 90/42 to 180. She has some readings in the 140's and 300'T syst622'Qbut also a good amount of 109-130's 333-545'G Dropped into the 90's about 3 times since last visit which is an improvement. Patient is happy with this. She eats something salty when this happens. Her vision gets a little blurry. She checks both her blood sugar and blood pressure. Blood sugar is good.   Denies any dizziness, lightheadedness, headache or swelling. Has pain in shoulders and back. Uses creams, massages and Advil if it gets really bad. Doing some chair exercises/stretches.  Home cuff previously checked Home cuff  - 165/67 Clinic cuff - 170/60  Home BP readings: 159/72, 177/80, 153/71, 155/73, 108/54, 122/55 136/61, 90/42, 107/53, 129/66, 132/60, 109/55, 98/51  Current HTN meds: amlodipine 2.5mg daily 53m30 AM), carvedilol 12.5mg twice a71my  BP goal: <130/80mmHg  Fami71mistory: The patient's sister may have died from something heart related, grandfather as well.  Social History: Denies tobacco, alcohol and drug use.  Wt Readings from Last 3 Encounters:  11/20/21 150 lb (68 kg)  10/12/21 145 lb (65.8 kg)  06/26/21 159 lb 6.4 oz (72.3 kg)   BP Readings from Last 3 Encounters:  03/25/22 (!) 154/60  02/12/22 (!)[name]  162/82  01/10/22 (!) 140/62   Pulse Readings from Last 3 Encounters:  03/25/22 67  02/12/22 66  01/10/22 72    Renal function: CrCl cannot be calculated (Patient's most recent lab result is older than the maximum 21 days allowed.).  Past Medical History:  Diagnosis Date   Arthritis    Dizziness    DM (diabetes mellitus) (HCC)    Generalized headaches    Hearing loss    High cholesterol    Hypertension    Low back pain    Obesity    Renal insufficiency    Seizure disorder ([place])     Current Outpatient Medications on File Prior to Visit  Medication Sig Dispense Refill   ACCU-CHEK GUIDE test  strip as directed as directed     amLODipine (NORVASC) 2.5 MG tablet Take 1 tablet (2.5 mg total) by mouth daily. 90 tablet 3   atorvastatin (LIPITOR) 40 MG tablet Take 1 tablet (40 mg total) by mouth daily. 90 tablet 3   Blood Glucose Monitoring Suppl (ACCU-CHEK GUIDE) w/Device KIT      Blood Glucose Monitoring Suppl (GLUCOCOM BLOOD GLUCOSE MONITOR) DEVI 1 Device by Misc.(Non-Drug; Combo Route) route as needed (to check blood glucose).     Blood Glucose Monitoring Suppl (ONETOUCH VERIO FLEX SYSTEM) w/Device KIT 1 DEVICE BY MISC.(NON-DRUG COMBO ROUTE) ROUTE AS NEEDED (TO CHECK BLOOD GLUCOSE).     carvedilol (COREG) 25 MG tablet Take 0.5 tablets (12.5 mg total) by mouth 2 (two) times daily with a meal. 90 tablet 3   Continuous Blood Gluc Receiver (DEXCOM G6 RECEIVER) DEVI Use as directed for continuous glucose monitoring.     Continuous Blood Gluc Sensor (DEXCOM G6 SENSOR) MISC Inject 1 sensor to the skin every 10 days for continuous glucose monitoring.     Continuous Blood Gluc Transmit (DEXCOM G6 TRANSMITTER) MISC Use as directed for continuous glucose monitoring. Reuse transmitter for 90 days then discard and replace.     [facility]S ASPIRIN ADULT LOW DOSE 81 MG chewable tablet Chew 81 mg by mouth daily.     diclofenac Sodium (VOLTAREN) 1 % GEL SMARTSIG:Gram(s) Topical 4 Times Daily     dorzolamidel-timolol (COSOPT) 22.3-6.8 MG/ML SOLN ophthalmic solution 1 drop 2 (two) times daily.     erythromycin ophthalmic ointment SMARTSIG:1 Sparingly Left Eye Every Evening     famotidine (PEPCID) 40 MG tablet Take 40 mg by mouth daily.     glucose blood (ONETOUCH VERIO) test strip To monitor sugar 4 times per day     glucose blood (PRECISION QID TEST) test strip To monitor sugar 4 times per day     insulin glargine (LANTUS) 100 UNIT/ML injection Inject 35 Units into the skin daily.     Insulin Pen Needle (BD PEN NEEDLE NANO U/F) 32G X 4 MM MISC Use once daily for [place] and once weekly for Trulicity      lamoTRIgine (LAMICTAL) 150 MG tablet TAKE 1 TABLET BY MOUTH TWICE A DAY 180 tablet 4   Lancets (ONETOUCH DELICA PLUS [id number]) MISC 1 DEVICE BY MISC.(NON-DRUG COMBO ROUTE) ROUTE 4 TIMES DAILY.     levETIRAcetam (KEPPRA) 500 MG tablet Take 1 tablet (500 mg total) by mouth daily. 90 tablet 4   meclizine (ANTIVERT) 25 MG tablet Take 25 mg by mouth 3 (three) times daily as needed for dizziness.     metFORMIN (GLUCOPHAGE) 500 MG tablet Take 500 mg by mouth 2 (two) times daily.     ONETOUCH VERIO test strip 4 (four)  times daily.     TOBRADEX ophthalmic ointment SMARTSIG:1 sparingly In Eye(s) Every Night     TRULICITY 1.5 GC/6.76YO SOPN Inject into the skin. 30 units weekly     Vitamin D, Ergocalciferol, (DRISDOL) 1.25 MG (50000 UNIT) CAPS capsule Take 50,000 Units by mouth once a week.     No current facility-administered medications on file prior to visit.    Allergies  Allergen Reactions   Levaquin [Levofloxacin] Hives     Assessment/Plan:  Hypertension -   Assessment:  Blood pressure is elevated today in clinic. Home readings range from 41'Z systolic to 530 systolic.  Several readings at goal Less lows and highs since medication change Patient and daughter seemed pleased with results With her still having an occasional low, very hesitant to increase amlodipine  Counseled patient on goal of <140/90 (due to issues with hypotension) Ok to occasionally have higher reading, but patient is to call me if she is consistently >140/90 Asked to limit Advil use- use creams/massage first to relieve pain  Plan: Continue amlodipine 2.74m daily and carvedilol 12.549mBID Patient to see [facility] on Thurs Follow up as needed   MeRamond DialPharm.D, BCPS, CPP [facility]1[place] StreetGrArvin[place] [address]Phone: [phone]: [phone] 05/07/2022 7:48 AM

## 2022-05-09 ENCOUNTER — Encounter (HOSPITAL_BASED_OUTPATIENT_CLINIC_OR_DEPARTMENT_OTHER): Payer: Self-pay | Admitting: Family

## 2022-05-09 ENCOUNTER — Ambulatory Visit (INDEPENDENT_AMBULATORY_CARE_PROVIDER_SITE_OTHER): Payer: Medicare Other | Admitting: Family

## 2022-05-09 VITALS — BP 172/62 | HR 71 | Ht 59.0 in | Wt 160.0 lb

## 2022-05-09 DIAGNOSIS — E785 Hyperlipidemia, unspecified: Secondary | ICD-10-CM | POA: Diagnosis not present

## 2022-05-09 DIAGNOSIS — G4733 Obstructive sleep apnea (adult) (pediatric): Secondary | ICD-10-CM | POA: Diagnosis not present

## 2022-05-09 DIAGNOSIS — I10 Essential (primary) hypertension: Secondary | ICD-10-CM

## 2022-05-09 DIAGNOSIS — I25118 Atherosclerotic heart disease of native coronary artery with other forms of angina pectoris: Secondary | ICD-10-CM

## 2022-05-09 MED ORDER — AMLODIPINE BESYLATE 2.5 MG PO TABS: 2.5000 mg | ORAL_TABLET | Freq: Two times a day (BID) | ORAL | 1 refills | Status: DC

## 2022-05-09 NOTE — Progress Notes (Signed)
[facility] Initial Assessment:    Date:  05/09/2022   ID:  Carolyn Little, DOB [birth year], MRN [id number]  PCP:  Benito Mccreedy, MD  Cardiologist:  [name], MD  Nephrologist:  Referring MD: Jerline Pain, MD   CC: Hypertension  History of Present Illness:    Carolyn Little is a 76 y.o. female with a hx of hypertension, CAD s/p stenting in 2015, DM, HLD, obesity, seizure disorder, arthritis here to establish care in the [facility].   ED visit 10/12/21 with hypertension and headache. Carvedilol previously decreased due to dizziness. 11/20/21 seen by Dr. Marlou Porch and had self discontinued Valsartan-HCTZ and Amlodipine and was taking Carvedilol 73m AM and 12.571mPM. Given BP 180/90, Valsartan-HCTZ resumed at lower dose. TSH was normal. 12/13/21 Carvedilol increased to 2559mID due to hypertension. Home cuff found to measure about 5mm74moff both systolic and diastolic. Echo 12/13/21 LVEF 70-75%. Renal duplex 12/20/21 negative for renal artery stenosis.  Previous monitoring by pharmacy team noted erratic blood pressures particularly with skipping doses of blood pressure medication when BP drops low.  Valsartan-HCTZ was moved to lunchtime.  She was advised to skip or delay dose of BP medication if her systolic was less than 110 626 take normal dose if greater than 110.  02/12/2022 carvedilol reduced to 12.5 mg twice daily due to hypotension.  Not much improvement seen.  03/25/2022 valsartan-HCTZ discontinued amlodipine 2.5 mg daily started.  She last saw the pharmacy team 05/07/2022 with several readings at goal but also some hypertensive and hypotensive episodes.  She was recommended continue amlodipine 2.5 mg daily and carvedilol 12.5 mg twice daily.  Carolyn Little [place]. It has been difficult to control. Blood pressure checked with arm cuff at home. Readings  have been labile. she reports tobacco use number. Alcohol use on rare occasion. For exercise she has no formal routine.  Blood pressures over the last week listed below.  She checks multiple times per day sometimes before and sometimes after medications.  Notes no chest pain, lightheadedness dizziness, dyspnea.  She has breakfast, a small snack at lunchtime, her large meal at dinner.  She drinks water and juice during the day.  We discussed the importance of regular meals and hydration to prevent orthostatic hypotension.  She was previously diagnosed with sleep apnea and wore [place] CPAP [place] still [place] but has not worn in greater than 1 year.  She has lost weight with decreased snoring though does still wake feeling tired.  178/80, 154/72, 126/62, 173/75, 183/83, 112/58, 114/57, 174/77, 168/77, 159/72, 160/72, 182/83,   Previous antihypertensives: Valsartan-HCTZ  Past Medical History:  Diagnosis Date   Arthritis    Dizziness    DM (diabetes mellitus) (HCC)    Generalized headaches    Hearing loss    High cholesterol    Hypertension    Low back pain    Obesity    Renal insufficiency    Seizure disorder (HCC)     Past Surgical History:  Procedure Laterality Date   BACK SURGERY     BRAIN TUMOR EXCISION  1984   HAND SURGERY     LAPAROSCOPIC HYSTERECTOMY     SHOULDER SURGERY      Current Medications: Current Meds  Medication Sig   ACCU-CHEK GUIDE test strip as directed as directed   atorvastatin (LIPITOR) 40 MG tablet Take 1  tablet (40 mg total) by mouth daily.   Blood Glucose Monitoring Suppl (ACCU-CHEK GUIDE) w/Device KIT    Blood Glucose Monitoring Suppl (GLUCOCOM BLOOD GLUCOSE MONITOR) DEVI 1 Device by Misc.(Non-Drug; Combo Route) route as needed (to check blood glucose).   Blood Glucose Monitoring Suppl (ONETOUCH VERIO FLEX SYSTEM) w/Device KIT 1 DEVICE BY MISC.(NON-DRUG COMBO ROUTE) ROUTE AS NEEDED (TO CHECK BLOOD GLUCOSE).   carvedilol  (COREG) 25 MG tablet Take 0.5 tablets (12.5 mg total) by mouth 2 (two) times daily with a meal.   celecoxib (CELEBREX) 100 MG capsule Take 100 mg by mouth daily.   Continuous Blood Gluc Receiver (DEXCOM G6 RECEIVER) DEVI Use as directed for continuous glucose monitoring.   Continuous Blood Gluc Sensor (DEXCOM G6 SENSOR) MISC Inject 1 sensor to the skin every 10 days for continuous glucose monitoring.   Continuous Blood Gluc Transmit (DEXCOM G6 TRANSMITTER) MISC Use as directed for continuous glucose monitoring. Reuse transmitter for 90 days then discard and replace.   [facility] ASPIRIN ADULT LOW DOSE 81 MG chewable tablet Chew 81 mg by mouth daily.   diclofenac Sodium (VOLTAREN) 1 % GEL SMARTSIG:Gram(s) Topical 4 Times Daily   dorzolamidel-timolol (COSOPT) 22.3-6.8 MG/ML SOLN ophthalmic solution 1 drop 2 (two) times daily.   erythromycin ophthalmic ointment SMARTSIG:1 Sparingly Left Eye Every Evening   famotidine (PEPCID) 40 MG tablet Take 40 mg by mouth daily.   glucose blood (ONETOUCH VERIO) test strip To monitor sugar 4 times per day   glucose blood (PRECISION QID TEST) test strip To monitor sugar 4 times per day   insulin glargine (LANTUS) 100 UNIT/ML injection Inject 35 Units into the skin daily.   Insulin Pen Needle (BD PEN NEEDLE NANO U/F) 32G X 4 MM MISC Use once daily for [place] and once weekly for Trulicity   lamoTRIgine (LAMICTAL) 150 MG tablet TAKE 1 TABLET BY MOUTH TWICE A DAY   Lancets (ONETOUCH DELICA PLUS [id number]) MISC 1 DEVICE BY MISC.(NON-DRUG COMBO ROUTE) ROUTE 4 TIMES DAILY.   levETIRAcetam (KEPPRA) 500 MG tablet Take 1 tablet (500 mg total) by mouth daily.   meclizine (ANTIVERT) 25 MG tablet Take 25 mg by mouth 3 (three) times daily as needed for dizziness.   metFORMIN (GLUCOPHAGE) 500 MG tablet Take 500 mg by mouth 2 (two) times daily.   methocarbamol (ROBAXIN) 500 MG tablet Take 500 mg by mouth at bedtime as needed.   ONETOUCH VERIO test strip 4 (four) times daily.   TOBRADEX  ophthalmic ointment SMARTSIG:1 sparingly In Eye(s) Every Night   TRULICITY 1.5 BB/4.0ZJ SOPN Inject into the skin. 30 units weekly   Vitamin D, Ergocalciferol, (DRISDOL) 1.25 MG (50000 UNIT) CAPS capsule Take 50,000 Units by mouth once a week.   [DISCONTINUED] amLODipine (NORVASC) 2.5 MG tablet Take 1 tablet (2.5 mg total) by mouth daily.     Allergies:   Levaquin [levofloxacin]   Social History   Socioeconomic History   Marital status: Widowed    Spouse name: Not on file   Number of children: 4   Years of education: Not on file   Highest education level: Not on file  Occupational History   Not on file  Tobacco Use   Smoking status: Never   Smokeless tobacco: Never  Vaping Use   Vaping Use: Never used  Substance and Sexual Activity   Alcohol use: Not Currently   Drug use: Never   Sexual activity: Not on file  Other Topics Concern   Not on file  Social History Narrative  Lives alone   Social Determinants of Health   Financial Resource Strain: Not on file  Food Insecurity: No Food Insecurity (05/09/2022)   Hunger Vital Sign    Worried About Running Out of Food in the Last Year: Never true    Ran Out of Food in the Last Year: Never true  Transportation Needs: No Transportation Needs (05/09/2022)   PRAPARE - Hydrologist (Medical): No    Lack of Transportation (Non-Medical): No  Physical Activity: Insufficiently Active (05/09/2022)   Exercise Vital Sign    Days of Exercise per Week: 7 days    Minutes of Exercise per Session: 20 min  Stress: Not on file  Social Connections: Not on file     Family History: The patient's family history is not on file.  ROS:   Please see the history of present illness.    All other systems reviewed and are negative.  EKGs/Labs/Other Studies Reviewed:    Renal duplex 12/20/21   Summary:  Renal:    Right: No evidence of right renal artery stenosis. Abnormal size for         the right kidney. Normal  cortical thickness of right kidney.         Normal right Resisitive Index.  Left:  No evidence of left renal artery stenosis. LRV flow present.         Normal size of left kidney. Normal left Resistive Index.         Normal cortical thickness of the left kidney.  Mesenteric:  Normal Celiac artery and Superior Mesenteric artery findings. Areas of  limited  visceral study include right renal artery, right kidney size and right  parenchymal flow.    *See table(s) above for measurements and observations.    Echo 12/13/21   1. Left ventricular ejection fraction, by estimation, is 70 to 75%. Left  ventricular ejection fraction by 3D volume is 71 %. The left ventricle has  hyperdynamic function. The left ventricle has no regional wall motion  abnormalities. There is mild left  ventricular hypertrophy. Left ventricular diastolic parameters are  consistent with Grade I diastolic dysfunction (impaired relaxation). The  average left ventricular global longitudinal strain is -24.4 %. The global  longitudinal strain is normal.   2. Right ventricular systolic function is normal. The right ventricular  size is normal.   3. Left atrial size was moderately dilated.   4. The mitral valve is abnormal. Mild mitral valve regurgitation.  Moderate to severe mitral annular calcification.   5. The aortic valve is tricuspid. Aortic valve regurgitation is not  visualized.   6. The inferior vena cava is normal in size with greater than 50%  respiratory variability, suggesting right atrial pressure of 3 mmHg.   Comparison(s): Changes from prior study are noted. 01/07/2020: LVEF 55-60%,  grade 2 DD.  EKG:  EKG is  ordered today.  The ekg ordered today demonstrates NSR 71 bpm with stable T wave inversion inferior lateral leads.  No acute ST/T wave changes.  Recent Labs: 10/12/2021: BUN 18; Creatinine, Ser 0.95; Hemoglobin 12.7; Platelets 293; Potassium 4.0; Sodium 139   Recent Lipid Panel No results found  for: "CHOL", "TRIG", "HDL", "CHOLHDL", "VLDL", "LDLCALC", "LDLDIRECT"  Physical Exam:   VS:  BP (!) 172/62 (BP Location: Left Arm, Patient Position: Sitting, Cuff Size: Large)   Pulse 71   Ht _0  (1.499 m)   Wt 160 lb (72.6 kg)   BMI 32.32 kg/m  ,  BMI Body mass index is 32.32 kg/m. GENERAL:  Well appearing HEENT: Pupils equal round and reactive, fundi not visualized, oral mucosa unremarkable NECK:  No jugular venous distention, waveform within normal limits, carotid upstroke brisk and symmetric, no bruits, no thyromegaly LYMPHATICS:  No cervical adenopathy LUNGS:  Clear to auscultation bilaterally HEART:  RRR.  PMI not displaced or sustained,S1 and S2 within normal limits, no S3, no S4, no clicks, no rubs, gr 1/6 soft murmurs ABD:  Flat, positive bowel sounds normal in frequency in pitch, no bruits, no rebound, no guarding, no midline pulsatile mass, no hepatomegaly, no splenomegaly EXT:  2 plus pulses throughout, no edema, no cyanosis no clubbing SKIN:  No rashes no nodules NEURO:  Cranial nerves II through XII grossly intact, motor grossly intact throughout PSYCH:  Cognitively intact, oriented to person place and time   ASSESSMENT/PLAN:    HTN - Previously on Valsartan-HCTZ though BP very labile and discontinued. Now on Coreg 12.68m BID and Amlodipine 2.544mQD. BP not at goal <130/80. Increase Amlodipine to 2.44m80mID. Anticipate some hypotensive readings more related to poor PO intake midday. Discussed to monitor BP at home at least 2 hours after medications and sitting for 5-10 minutes.  Secondary workup: Catecholamines, metanephrines, cortisol in 1 week.  Plan for carotid duplex as BP asymmetrical.  Prior renal artery dorsiflexing with no renal artery stenosis.  OSA - Prior diagnosis. Has not worn CPAP in >1 year though does still have at home. [place]  CAD / HLD - Stable with no anginal symptoms. No indication  for ischemic evaluation.  Follows with Dr. SkaMarlou Porch general cardiology. GDMT aspirin, atorvastatin, carvedilol. Heart healthy diet and regular cardiovascular exercise encouraged.    Screening for Secondary Hypertension:     05/09/2022    4:46 PM  Causes  Sleep Apnea Screened  Thyroid Disease Screened  Pheochromocytoma Screened  Cushing's Syndrome Screened  Coarctation of the Aorta Screened    Relevant Labs/Studies:    Latest Ref Rng & Units 10/12/2021    7:30 PM  Basic Labs  Sodium 135 - 145 mmol/L 139   Potassium 3.5 - 5.1 mmol/L 4.0   Creatinine 0.44 - 1.00 mg/dL 0.95                    12/20/2021   10:26 AM  Renovascular   Renal Artery US [place] Yes    Disposition:    FU with MD/PharmD in 6 weeks    Medication Adjustments/Labs and Tests Ordered: Current medicines are reviewed at length with the patient today.  Concerns regarding medicines are outlined above.  Orders Placed This Encounter  Procedures   Catecholamines, fractionated, plasma   Metanephrines, plasma   Cortisol   EKG 12-Lead   Home sleep test   VAS US [place]   Meds ordered this encounter  Medications   amLODipine (NORVASC) 2.5 MG tablet    Sig: Take 1 tablet (2.5 mg total) by mouth in the morning and at bedtime.    Dispense:  180 tablet    Refill:  1    Order Specific Question:   Supervising Provider    Answer:   CHRBuford Dresser0[[id number]]  Signed, CaiLoel DubonnetP  05/09/2022 4:47 PM    [employer]

## 2022-05-09 NOTE — Patient Instructions (Addendum)
Medication Instructions/Medicamentos   CHANGE Amlodipine to 2.'5mg'$  twice daily  [place] Amlodipine 2.'5mg'$  una pastilla dos veces cada dia   Labwork/Laboratorios Your physician recommends that you return for lab work in: 1-2 weeks for fasting labs for cortisol, metanephrines, catecholamines. Please be fasting.   Laboratorios en 1-2 meses. No comida nor bebida antes de laboratorios.  Please return for Lab work. You may come to the...   [facility] (3rd floor) [address], [place], [place] [address]  Open: 8am-Noon and 1pm-4:30pm  Please ring the doorbell on the small table when you exit the elevator and the Lab Tech will come get you  [place] [facility] [address] [place], [place], [place] [address] Open: 8am-1pm, then 2pm-4:30pm   [place]- Please see attached locations sheet stapled to your lab work with address and hours.     Testing/Procedures: Your physician has recommended that you have a home sleep study. This test records several body functions during sleep, including: brain activity, eye movement, oxygen and carbon dioxide blood levels, heart rate and rhythm, breathing rate and rhythm, the flow of air through your mouth and nose, snoring, body muscle movements, and chest and belly movement.   Your physician has requested that you have a carotid duplex. This test is an ultrasound of the carotid arteries in your neck. It looks at blood flow through these arteries that supply the brain with blood. Allow one hour for this exam. There are no restrictions or special instructions.    Follow-Up/Proxima cita  Tuesday, January 23rd at 10:15AM with Dr. Oval Linsey  23 de Enero a las 10:15 de le manana con Dr. Oval Linsey  Special Instructions:    Plan de alimentacin DASH DASH Eating Plan DASH es la sigla en ingls de "Enfoques Alimentarios para Detener la Hipertensin". El plan de alimentacin DASH ha demostrado: Bajar la presin  arterial elevada (hipertensin). Reducir el riesgo de diabetes tipo 2, enfermedad cardaca y accidente cerebrovascular. Ayudar a perder peso. Consejos para seguir Photographer las etiquetas de los alimentos Verifique la cantidad de sal (sodio) por porcin en las etiquetas de los alimentos. Elija alimentos con menos del 5 por ciento del valor diario de sodio. Generalmente, los alimentos con menos de 300 miligramos (mg) de sodio por porcin se encuadran dentro de este plan alimentario. Para encontrar cereales integrales, busque la palabra "integral" como primera palabra en la lista de ingredientes. Al ir de compras Compre productos en los que en su etiqueta diga: "bajo contenido de sodio" o "sin agregado de sal". Compre alimentos frescos. Evite los alimentos enlatados y comidas precocidas o congeladas. Al cocinar Evite agregar sal cuando cocine. Use hierbas o aderezos sin sal, en lugar de sal de mesa o sal marina. Consulte al mdico o farmacutico antes de usar sustitutos de la sal. No fra los alimentos. A la hora de cocinar los alimentos opte por hornearlos, hervirlos, grillarlos, asarlos al horno y asarlos a Administrator, arts. Cocine con aceites cardiosaludables, como oliva, canola, aguacate, soja o girasol. Planificacin de las comidas  Consuma una dieta equilibrada, que incluya lo siguiente: 4 o ms porciones de frutas y 4 o ms porciones de Set designer. Trate de que medio plato de cada comida sea de frutas y verduras. De 6 a 8 porciones de [employer]. Menos de 6 onzas (170 g) de carne, aves o pescado Games developer. Una porcin de 3 onzas (85 g) de carne tiene casi el mismo [employer]  un mazo de cartas. Un huevo equivale a 1 onza (28 g). De 2 a 3 porciones de productos lcteos descremados por da. Una porcin es 1 taza (237 ml). 1 porcin de frutos secos, semillas o frijoles 5 veces por semana. De 2 a 3 porciones de grasas cardiosaludables. Las grasas saludables  llamadas cidos grasos omega-3 se encuentran en alimentos como las nueces, las semillas de [place], las leches fortificadas y [place]. Estas grasas tambin se encuentran en los pescados de agua fra, como la sardina, el salmn y la caballa. Limite la cantidad que consume de: Alimentos enlatados o envasados. Alimentos con alto contenido de grasa trans, como algunos alimentos fritos. Alimentos con alto contenido de grasa saturada, como carne con grasa. Postres y otros dulces, bebidas azucaradas y otros alimentos con azcar agregada. Productos lcteos enteros. No le agregue sal a los alimentos antes de probarlos. No coma ms de 4 yemas de huevo por semana. Trate de comer al menos 2 comidas vegetarianas por semana. Consuma ms comida casera y menos de restaurante, de bares y comida rpida. Estilo de vida Cuando coma en un restaurante, pida que preparen su comida con menos sal o, en lo posible, sin nada de sal. Si bebe alcohol: Limite la cantidad que bebe: De 0 a 1 medida por da para las mujeres que no estn embarazadas. De 0 a 2 medidas por da para los hombres. Est atento a la cantidad de alcohol que hay en las bebidas que toma. En los [place], una medida equivale a una botella de cerveza de 12 oz (355 ml), un vaso de vino de 5 oz (148 ml) o un vaso de una bebida alcohlica de alta graduacin de 1 oz (44 ml). Informacin general Evite ingerir ms de 2300 mg de sal por da. Si tiene hipertensin, es posible que necesite reducir la ingesta de sodio a 1,500 mg por da. Trabaje con su mdico para mantener un peso saludable o perder [employer]. Pregntele cul es el peso recomendado para usted. Realice al menos 30 minutos de ejercicio que haga que se acelere su corazn (ejercicio Arboriculturist) la [employer] de la [place]. Estas actividades pueden incluir caminar, nadar o andar en bicicleta. Trabaje con su mdico o nutricionista para ajustar su plan alimentario a sus necesidades calricas  personales. Qu alimentos debo comer? Frutas Todas las frutas frescas, congeladas o disecadas. Frutas enlatadas en jugo natural (sin agregado de azcar). Verduras Verduras frescas o congeladas (crudas, al vapor, asadas o grilladas). Jugos de tomate y verduras con bajo contenido de sodio o reducidos en sodio. Salsa y pasta de tomate con bajo contenido de sodio o reducidas en sodio. Verduras enlatadas con bajo contenido de sodio o reducidas en sodio. Granos Pan de salvado o integral. Pasta de salvado o integral. Arroz integral. Avena. Quinua. Trigo burgol. Cereales integrales y con bajo contenido de sodio. Pan pita. Galletitas de [place] con bajo contenido de [place] y [place]. Tortillas de [place] integral. Carnes y otras protenas Pollo o pavo sin piel. Carne de pollo o de [place]. Cerdo desgrasado. Pescado y [employer]. Claras de huevo. Porotos, guisantes o lentejas secos. Frutos secos, mantequilla de frutos secos y semillas sin sal. Frijoles enlatados sin sal. Cortes de carne vacuna magra, desgrasada. Carne precocida o curada magra y baja en sodio, como embutidos o panes de carne. Lcteos Leche descremada (1 %) o descremada. Quesos reducidos en grasa, con bajo contenido de grasa o descremados. Queso blanco o ricota sin grasa, con bajo contenido de [employer].  Yogur semidescremado o descremado. Queso con bajo contenido de [place] y [place]. Grasas y [employer] Express untables que no contengan grasas trans. Aceite vegetal. Lubertha Basque y aderezos para ensaladas livianos, reducidos en grasa o con bajo contenido de grasas (reducidos en sodio). Aceite de canola, crtamo, oliva, aguacate, soja y [place]. [place]. Alios y condimentos Hierbas. Especias. Mezclas de condimentos sin sal. Otros alimentos Palomitas de maz y pretzels sin sal. Dulces con bajo contenido de grasas. Es posible que los productos que se enumeran ms [place] no constituyan una lista completa de los alimentos y las bebidas que puede tomar. Consulte  a un nutricionista para obtener ms informacin. Qu alimentos debo evitar? Lambert Mody Fruta enlatada en almbar liviano o espeso. Frutas cocidas en aceite. Frutas con salsa de crema o mantequilla. Verduras Verduras con crema o fritas. Verduras en [place]. Verduras enlatadas regulares (que no sean con bajo contenido de sodio o reducidas en sodio). Pasta y salsa de tomates enlatadas regulares (que no sean con bajo contenido de sodio o reducidas en sodio). Jugos de tomate y verduras regulares (que no sean con bajo contenido de sodio o reducidos en sodio). Pepinillos. Aceitunas. Granos Productos de panificacin hechos con grasa, como medialunas, magdalenas y algunos panes. Comidas con arroz o pasta seca listas para usar. Carnes y otras protenas Cortes de carne con alto contenido de Lobbyist. Costillas. Carne frita. [place]. [place], salame y otras carnes precocidas o curadas, como embutidos o panes de carne. Grasa de la espalda del cerdo (panceta). [place]. Frutos secos y semillas con sal. Frijoles enlatados con agregado de sal. Pescado enlatado o ahumado. Huevos enteros o yemas. Pollo o pavo con piel. Lcteos Leche entera o al 2 %, crema y mitad leche y mitad crema. Queso crema entero o con toda su grasa. Yogur entero o endulzado. Quesos con toda su grasa. Sustitutos de cremas no lcteas. Coberturas batidas. Quesos para untar y quesos procesados. Grasas y [employer]. Margarina en barra. [place]oyle. Lardo. Mantequilla clarificada. Grasa de panceta. Aceites tropicales como aceite de coco, palmiste o palma. Alios y condimentos Sal de cebolla, sal de ajo, sal condimentada, sal de mesa y sal marina. Salsa Worcestershire. Salsa trtara. Salsa barbacoa. Salsa teriyaki. Salsa de soja, incluso la que tiene contenido reducido de [place]. Salsa de carne. Salsas en lata y envasadas. Salsa de pescado. Salsa de [place]. Salsa rosada. Rbanos picantes comprados en tiendas. [place]. Mostaza.  Saborizantes y tiernizantes para carne. Caldo en cubitos. Salsas picantes. Adobos preelaborados o envasados. Aderezos para tacos preelaborados o envasados. Salsas de pepinillos. Aderezos comunes para ensalada. Otros alimentos Palomitas de maz y pretzels con sal. Es posible que los productos que se enumeran ms arriba no constituyan una lista completa de los alimentos y las bebidas que Nurse, adult. Consulte a un nutricionista para obtener ms informacin. Dnde buscar ms informacin National Heart, Lung, and Blood Institute ([place], los Pulmones y Herbalist): https://wilson-eaton.com/ American Heart Association (Asociacin Estadounidense del Corazn): www.heart.org Academy of Nutrition and Dietetics (Academia de Nutricin y Information systems manager): www.eatright.[place] ([place]): www.kidney.org Resumen El plan de alimentacin DASH ha demostrado bajar la presin arterial elevada (hipertensin). Tambin puede reducir [employer]Health de diabetes tipo 2, enfermedad cardaca y accidente cerebrovascular. Cuando siga el plan de alimentacin DASH, trate de comer ms frutas frescas y verduras, cereales integrales, carnes magras, lcteos descremados y grasas cardiosaludables. Con el plan de alimentacin DASH, deber limitar el consumo de sal (sodio) a 2,300 mg por da. Si  tiene hipertensin, es posible que necesite reducir la ingesta de sodio a 1,500 mg por da. Trabaje con su mdico o nutricionista para ajustar su plan alimentario a sus necesidades calricas personales. Esta informacin no tiene Marine scientist el consejo del mdico. Asegrese de hacerle al mdico cualquier pregunta que tenga. Document Revised: 06/17/2019 Document Reviewed: 06/17/2019 Elsevier Patient Education  [place].

## 2022-05-15 DIAGNOSIS — I10 Essential (primary) hypertension: Secondary | ICD-10-CM | POA: Diagnosis not present

## 2022-05-22 LAB — CATECHOLAMINES, FRACTIONATED, PLASMA
Dopamine: 32 pg/mL (ref 0–48)
Epinephrine: 41 pg/mL (ref 0–62)
Norepinephrine: 1091 pg/mL — ABNORMAL HIGH (ref 0–874)

## 2022-05-22 LAB — METANEPHRINES, PLASMA
Metanephrine, Free: 45 pg/mL (ref 0.0–88.0)
Normetanephrine, Free: 130 pg/mL (ref 0.0–285.2)

## 2022-05-22 LAB — CORTISOL: Cortisol: 12.6 ug/dL (ref 6.2–19.4)

## 2022-05-23 ENCOUNTER — Telehealth (HOSPITAL_BASED_OUTPATIENT_CLINIC_OR_DEPARTMENT_OTHER): Payer: Self-pay

## 2022-05-23 NOTE — Telephone Encounter (Addendum)
Results called to patient who verbalizes understanding!    ----- Message from Loel Dubonnet, NP sent at 05/23/2022  1:56 PM EST ----- Normal cortisol and metanephrines.  One of catecholamine measurements is mildly abnormal but not of concern.  A good result! No evidence of secondary hypertension.

## 2022-05-31 ENCOUNTER — Ambulatory Visit (HOSPITAL_BASED_OUTPATIENT_CLINIC_OR_DEPARTMENT_OTHER): Payer: Medicare Other | Attending: Family | Admitting: Cardiovascular Disease

## 2022-05-31 DIAGNOSIS — G4733 Obstructive sleep apnea (adult) (pediatric): Secondary | ICD-10-CM | POA: Insufficient documentation

## 2022-06-04 DIAGNOSIS — M25512 Pain in left shoulder: Secondary | ICD-10-CM | POA: Diagnosis not present

## 2022-06-05 ENCOUNTER — Ambulatory Visit (INDEPENDENT_AMBULATORY_CARE_PROVIDER_SITE_OTHER): Payer: Medicare Other

## 2022-06-05 DIAGNOSIS — I6523 Occlusion and stenosis of bilateral carotid arteries: Secondary | ICD-10-CM | POA: Diagnosis not present

## 2022-06-05 DIAGNOSIS — I10 Essential (primary) hypertension: Secondary | ICD-10-CM | POA: Diagnosis not present

## 2022-06-06 ENCOUNTER — Telehealth (HOSPITAL_BASED_OUTPATIENT_CLINIC_OR_DEPARTMENT_OTHER): Payer: Self-pay

## 2022-06-06 DIAGNOSIS — I6523 Occlusion and stenosis of bilateral carotid arteries: Secondary | ICD-10-CM

## 2022-06-06 NOTE — Telephone Encounter (Addendum)
Results called to patient who verbalizes understanding!     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  Repeat ultrasound in 1 year for monitoring.  Continue atorvastatin to prevent progression.

## 2022-06-16 ENCOUNTER — Encounter (HOSPITAL_BASED_OUTPATIENT_CLINIC_OR_DEPARTMENT_OTHER): Payer: Self-pay | Admitting: Cardiovascular Disease

## 2022-06-16 NOTE — Procedures (Signed)
     Patient Name: [name], [name] Date: 06/02/2022 Gender: Female D.O.B: 18-Feb-1946 Age (years): 27 Referring Provider: Loel Dubonnet NP Height (inches): 35 Interpreting Physician: Shelva Majestic MD, ABSM Weight (lbs): 160 RPSGT: Jacolyn Reedy BMI: 32 MRN: [id number] Neck Size: 16.00  CLINICAL INFORMATION Sleep Study Type: HST  Indication for sleep study:  OSA, not on CPAP for over a year, but reportedly still has her CPAP device.  Epworth Sleepiness Score: 3  SLEEP STUDY TECHNIQUE A multi-channel overnight portable sleep study was performed. The channels recorded were: nasal airflow, thoracic respiratory movement, and oxygen saturation with a pulse oximetry. Snoring was also monitored.  MEDICATIONS amLODipine (NORVASC) 2.5 MG tablet atorvastatin (LIPITOR) 40 MG tablet Blood Glucose Monitoring Suppl (ACCU-CHEK GUIDE) w/Device KIT Blood Glucose Monitoring Suppl (GLUCOCOM BLOOD GLUCOSE MONITOR) DEVI Blood Glucose Monitoring Suppl (ONETOUCH VERIO FLEX SYSTEM) w/Device KIT carvedilol (COREG) 25 MG tablet celecoxib (CELEBREX) 100 MG capsule Continuous Blood Gluc Receiver (DEXCOM G6 RECEIVER) DEVI Continuous Blood Gluc Sensor (DEXCOM G6 SENSOR) MISC Continuous Blood Gluc Transmit (DEXCOM G6 TRANSMITTER) MISC [facility] ASPIRIN ADULT LOW DOSE 81 MG chewable tablet diclofenac Sodium (VOLTAREN) 1 % GEL dorzolamidel-timolol (COSOPT) 22.3-6.8 MG/ML SOLN ophthalmic solution erythromycin ophthalmic ointment famotidine (PEPCID) 40 MG tablet glucose blood (ONETOUCH VERIO) test strip glucose blood (PRECISION QID TEST) test strip insulin glargine (LANTUS) 100 UNIT/ML injection Insulin Pen Needle (BD PEN NEEDLE NANO U/F) 32G X 4 MM MISC lamoTRIgine (LAMICTAL) 150 MG tablet Lancets (ONETOUCH DELICA PLUS [id number]) MISC levETIRAcetam (KEPPRA) 500 MG tablet meclizine (ANTIVERT) 25 MG tablet metFORMIN (GLUCOPHAGE) 500 MG tablet methocarbamol (ROBAXIN) 500 MG tablet ONETOUCH VERIO  test strip TOBRADEX ophthalmic ointment TRULICITY 1.5 HR/4.1UL SOPN Vitamin D, Ergocalciferol, (DRISDOL) 1.25 MG (50000 UNIT) CAPS capsule Patient self administered medications include: N/A.  SLEEP ARCHITECTURE Patient was studied for 366.5 minutes. The sleep efficiency was 100.0 % and the patient was supine for 0%. The arousal index was 0.0 per hour.  RESPIRATORY PARAMETERS The overall AHI was 23.6 per hour, with a central apnea index of 0 per hour.  The oxygen nadir was 59% during sleep. Time spent < 89% was 13.3 minutes.  CARDIAC DATA Mean heart rate during sleep was 64.9 bpm. Heart rate range was 58 - 84 bpm.  IMPRESSIONS - Moderate obstructive sleep apnea occurred during this study (AHI 23.6/h). - Severe oxygen desaturation was noted during this study (Min O2 59%). - Patient snored 12.4% during the sleep.  DIAGNOSIS - Obstructive Sleep Apnea (G47.33) - Nocturnal Hypoxemia (G47.36)  RECOMMENDATIONS - In this patient with a history of OSA and prior CPAP therapy, can retry Auto-PAP with EPR of 3 at 7 - 18 cm of water. Alternatively, an in-lab titration study can be performed.  - Effort should be made to optimize nasal  and oropharyngeal patency. - Avoid alcohol, sedatives and other CNS depressants that may worsen sleep apnea and disrupt normal sleep architecture. - Sleep hygiene should be reviewed to assess factors that may improve sleep quality. - Weight management (BMI 32) and regular exercise should be initiated or continued. - Return to Sleep Center to discuss the results of this study  [Electronically signed] 06/16/2022 11:12 AM  Shelva Majestic MD, [facility], [place], American Board of Sleep Medicine  NPI: [id number]   [place] PH: [phone]   FX: [phone] [place]

## 2022-06-18 ENCOUNTER — Encounter (HOSPITAL_BASED_OUTPATIENT_CLINIC_OR_DEPARTMENT_OTHER): Payer: Self-pay | Admitting: Cardiovascular Disease

## 2022-06-18 ENCOUNTER — Ambulatory Visit (INDEPENDENT_AMBULATORY_CARE_PROVIDER_SITE_OTHER): Payer: 59 | Admitting: Cardiovascular Disease

## 2022-06-18 VITALS — BP 148/62 | HR 77 | Ht 59.0 in | Wt 158.7 lb

## 2022-06-18 DIAGNOSIS — I2510 Atherosclerotic heart disease of native coronary artery without angina pectoris: Secondary | ICD-10-CM | POA: Diagnosis not present

## 2022-06-18 DIAGNOSIS — I10 Essential (primary) hypertension: Secondary | ICD-10-CM | POA: Diagnosis not present

## 2022-06-18 DIAGNOSIS — E782 Mixed hyperlipidemia: Secondary | ICD-10-CM

## 2022-06-18 DIAGNOSIS — G4733 Obstructive sleep apnea (adult) (pediatric): Secondary | ICD-10-CM | POA: Diagnosis not present

## 2022-06-18 HISTORY — DX: Obstructive sleep apnea (adult) (pediatric): G47.33

## 2022-06-18 MED ORDER — AMLODIPINE BESYLATE 5 MG PO TABS: 5.0000 mg | ORAL_TABLET | Freq: Two times a day (BID) | ORAL | 3 refills | Status: DC

## 2022-06-18 NOTE — Assessment & Plan Note (Signed)
She has moderate sleep apnea.  She does not have a machine.  Will reach out to our sleep medicine team to help her with next steps.  Encouraged her to use it and discussed how sleep apnea can affect her body.

## 2022-06-18 NOTE — Assessment & Plan Note (Signed)
No symptoms at this time.  Continue aspirin, atorvastatin and carvedilol.  BP control as above.

## 2022-06-18 NOTE — Progress Notes (Signed)
[facility] Follow-up:    Date:  06/18/2022   ID:  Carolyn Little, DOB Oct 07, 1945, MRN [id number]  PCP:  Benito Mccreedy, MD  Cardiologist:  [name], MD  Nephrologist:  Referring MD: Benito Mccreedy, MD   CC: Hypertension  History of Present Illness:    Carolyn Little is a 77 y.o. female with a hx of hypertension, hyperlipidemia, diabetes, and CAD, here for follow-up. She was initially seen 05/09/2022 by Laurann Montana, NP in the [facility]. She has previously worked with Dr. Marlou Porch and self-discontinued valsartan/HCTZ and amlodipine. She was seen in the ED 09/2021 with hypertension and headaches. Echo 12/13/21 LVEF 70-75%. Renal duplex 12/20/21 negative for renal artery stenosis. She has been working with our pharmacist as well and has had labile blood pressures. She was noted to have sleep apnea but not adherent to CPAP. [name] increased her amlodipine to 2.5 mg twice daily. She repeated her sleep study which confirmed moderate sleep apnea and CPAP was recommended. She had carotid dopplers that revealed moderate stenosis on the right and mild on the left.   Today, she is accompanied by a professional interpreter and a family member. She states she is feeling fine. Yesterday she felt mildly dizzy, but had checked her BP prior to her symptom. At home her BP has been labile, ranging 130s-150s mostly; sometimes as high as 158 and as low as 833 systolic. Generally she notices lower blood pressures first thing in the morning, and increased readings later in the day. Typically she does not complete much exercise. Previously she was more active. She is limited by left hip pain and prior back surgery. She also complains of left shoulder pain, and lower back pain. She does follow with orthopedics, and will participate in physical therapy. For pain management she would usually take Advil as it has been more effective than tylenol. She confirms experiencing  intermittent LLE edema inferior to the knee. She would relate this to her left knee pain. Lately her breathing has been stable. She denies orthopnea at night. Additionally she complains of constantly feeling cold in her bilateral feet, even with wearing socks. She also feels associated pinching/tingling sensations. This has been ongoing for 2-3 months. She denies any palpitations, chest pain, headaches, syncope, or PND.  Previous antihypertensives: Valsartan-HCTZ  Past Medical History:  Diagnosis Date   Arthritis    Dizziness    DM (diabetes mellitus) (HCC)    Generalized headaches    Hearing loss    High cholesterol    Hypertension    Low back pain    Obesity    OSA (obstructive sleep apnea) 06/18/2022   Renal insufficiency    Seizure disorder ([place])     Past Surgical History:  Procedure Laterality Date   BACK SURGERY     BRAIN TUMOR EXCISION  1984   HAND SURGERY     LAPAROSCOPIC HYSTERECTOMY     SHOULDER SURGERY      Current Medications: Current Meds  Medication Sig   ACCU-CHEK GUIDE test strip as directed as directed   atorvastatin (LIPITOR) 40 MG tablet Take 1 tablet (40 mg total) by mouth daily.   Blood Glucose Monitoring Suppl (ACCU-CHEK GUIDE) w/Device KIT    Blood Glucose Monitoring Suppl (GLUCOCOM BLOOD GLUCOSE MONITOR) DEVI 1 Device by Misc.(Non-Drug; Combo Route) route as needed (to check blood glucose).   Blood Glucose Monitoring Suppl (ONETOUCH VERIO FLEX SYSTEM) w/Device KIT 1 DEVICE BY MISC.(NON-DRUG COMBO ROUTE) ROUTE AS NEEDED (TO CHECK BLOOD GLUCOSE).  carvedilol (COREG) 25 MG tablet Take 0.5 tablets (12.5 mg total) by mouth 2 (two) times daily with a meal.   celecoxib (CELEBREX) 100 MG capsule Take 100 mg by mouth daily.   Continuous Blood Gluc Receiver (DEXCOM G6 RECEIVER) DEVI Use as directed for continuous glucose monitoring.   Continuous Blood Gluc Sensor (DEXCOM G6 SENSOR) MISC Inject 1 sensor to the skin every 10 days for continuous glucose  monitoring.   Continuous Blood Gluc Transmit (DEXCOM G6 TRANSMITTER) MISC Use as directed for continuous glucose monitoring. Reuse transmitter for 90 days then discard and replace.   [facility] ASPIRIN ADULT LOW DOSE 81 MG chewable tablet Chew 81 mg by mouth daily.   diclofenac Sodium (VOLTAREN) 1 % GEL SMARTSIG:Gram(s) Topical 4 Times Daily   erythromycin ophthalmic ointment SMARTSIG:1 Sparingly Left Eye Every Evening   famotidine (PEPCID) 40 MG tablet Take 40 mg by mouth daily.   glucose blood (ONETOUCH VERIO) test strip To monitor sugar 4 times per day   glucose blood (PRECISION QID TEST) test strip To monitor sugar 4 times per day   insulin glargine (LANTUS) 100 UNIT/ML injection Inject 35 Units into the skin daily.   Insulin Pen Needle (BD PEN NEEDLE NANO U/F) 32G X 4 MM MISC Use once daily for [place] and once weekly for Trulicity   lamoTRIgine (LAMICTAL) 150 MG tablet TAKE 1 TABLET BY MOUTH TWICE A DAY   Lancets (ONETOUCH DELICA PLUS [id number]) MISC 1 DEVICE BY MISC.(NON-DRUG COMBO ROUTE) ROUTE 4 TIMES DAILY.   levETIRAcetam (KEPPRA) 500 MG tablet Take 1 tablet (500 mg total) by mouth daily.   meclizine (ANTIVERT) 25 MG tablet Take 25 mg by mouth 3 (three) times daily as needed for dizziness.   metFORMIN (GLUCOPHAGE) 500 MG tablet Take 500 mg by mouth 2 (two) times daily.   methocarbamol (ROBAXIN) 500 MG tablet Take 500 mg by mouth at bedtime as needed.   ONETOUCH VERIO test strip 4 (four) times daily.   SIMBRINZA 1-0.2 % SUSP Apply 1 drop to eye 2 (two) times daily.   TOBRADEX ophthalmic ointment SMARTSIG:1 sparingly In Eye(s) Every Night   TRULICITY 1.5 WU/9.8JX SOPN Inject into the skin. 30 units weekly   Vitamin D, Ergocalciferol, (DRISDOL) 1.25 MG (50000 UNIT) CAPS capsule Take 50,000 Units by mouth once a week.   [DISCONTINUED] amLODipine (NORVASC) 2.5 MG tablet Take 1 tablet (2.5 mg total) by mouth in the morning and at bedtime.   [DISCONTINUED] dorzolamidel-timolol (COSOPT) 22.3-6.8  MG/ML SOLN ophthalmic solution 1 drop 2 (two) times daily.     Allergies:   Levaquin [levofloxacin]   Social History   Socioeconomic History   Marital status: Widowed    Spouse name: Not on file   Number of children: 4   Years of education: Not on file   Highest education level: Not on file  Occupational History   Not on file  Tobacco Use   Smoking status: Never   Smokeless tobacco: Never  Vaping Use   Vaping Use: Never used  Substance and Sexual Activity   Alcohol use: Not Currently   Drug use: Never   Sexual activity: Not on file  Other Topics Concern   Not on file  Social History Narrative   Lives alone   Social Determinants of Health   Financial Resource Strain: Not on file  Food Insecurity: No Food Insecurity (05/09/2022)   Hunger Vital Sign    Worried About Running Out of Food in the Last Year: Never true  Ran Out of Food in the Last Year: Never true  Transportation Needs: No Transportation Needs (05/09/2022)   PRAPARE - Hydrologist (Medical): No    Lack of Transportation (Non-Medical): No  Physical Activity: Insufficiently Active (05/09/2022)   Exercise Vital Sign    Days of Exercise per Week: 7 days    Minutes of Exercise per Session: 20 min  Stress: Not on file  Social Connections: Not on file     Family History: The patient's family history is not on file.  ROS:   Please see the history of present illness.    (+) Mild dizziness (+) Left shoulder pain (+) Left knee pain (+) Lower back pain (+) Intermittent LLE edema inferior to the knee (+) Coldness, tingling sensations of bilateral feet All other systems reviewed and are negative.  EKGs/Labs/Other Studies Reviewed:    Bilateral Carotid Dopplers  06/05/2022: Summary:  Right Carotid: Velocities in the right ICA are consistent with a 40-59%                 stenosis. The ECA appears >50% stenosed.   Left Carotid: Velocities in the left ICA are consistent with a  1-39%  stenosis.               The ECA appears >50% stenosed.   Vertebrals:  Bilateral vertebral arteries demonstrate antegrade flow.  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 Normal flow  hemodynamics were seen in the left subclavian artery.   Renal Artery Dopplers  7/27/223 Summary:  Renal:    Right: No evidence of right renal artery stenosis. Abnormal size for         the right kidney. Normal cortical thickness of right kidney.         Normal right Resisitive Index.  Left:  No evidence of left renal artery stenosis. LRV flow present.         Normal size of left kidney. Normal left Resistive Index.         Normal cortical thickness of the left kidney.  Mesenteric:  Normal Celiac artery and Superior Mesenteric artery findings. Areas of  limited  visceral study include right renal artery, right kidney size and right  parenchymal flow.   Echo  12/13/2021:  1. Left ventricular ejection fraction, by estimation, is 70 to 75%. Left  ventricular ejection fraction by 3D volume is 71 %. The left ventricle has  hyperdynamic function. The left ventricle has no regional wall motion  abnormalities. There is mild left  ventricular hypertrophy. Left ventricular diastolic parameters are  consistent with Grade I diastolic dysfunction (impaired relaxation). The  average left ventricular global longitudinal strain is -24.4 %. The global  longitudinal strain is normal.   2. Right ventricular systolic function is normal. The right ventricular  size is normal.   3. Left atrial size was moderately dilated.   4. The mitral valve is abnormal. Mild mitral valve regurgitation.  Moderate to severe mitral annular calcification.   5. The aortic valve is tricuspid. Aortic valve regurgitation is not  visualized.   6. The inferior vena cava is normal in size with greater than 50%  respiratory variability, suggesting right atrial pressure of 3 mmHg.   Comparison(s): Changes from prior study are  noted. 01/07/2020: LVEF 55-60%,  grade 2 DD.   Nuclear Stress Myoview  01/07/2020: The left ventricular ejection fraction is normal (55-65%). Nuclear stress EF: 59%. There was no ST segment deviation noted during stress. The study  is normal. This is a low risk study.   Normal resting and stress perfusion. No ischemia or infarction EF 59%  EKG:  EKG is personally reviewed. 06/18/2022: EKG was not ordered.  Recent Labs: 10/12/2021: BUN 18; Creatinine, Ser 0.95; Hemoglobin 12.7; Platelets 293; Potassium 4.0; Sodium 139   Recent Lipid Panel No results found for: "CHOL", "TRIG", "HDL", "CHOLHDL", "VLDL", "LDLCALC", "LDLDIRECT"  Physical Exam:    VS:  BP (!) 148/62 (BP Location: Left Arm, Patient Position: Sitting, Cuff Size: Normal)   Pulse 77   Ht '4\' 11"'$  (1.499 m)   Wt 158 lb 11.2 oz (72 kg)   SpO2 98%   BMI 32.05 kg/m  , BMI Body mass index is 32.05 kg/m. GENERAL:  Well appearing HEENT: Pupils equal round and reactive, fundi not visualized, oral mucosa unremarkable NECK:  No jugular venous distention, waveform within normal limits, carotid upstroke brisk and symmetric, no bruits, no thyromegaly LUNGS:  Clear to auscultation bilaterally HEART:  RRR.  PMI not displaced or sustained,S1 and S2 within normal limits, no S3, no S4, no clicks, no rubs, II/VI systolic murmur at the LUSB ABD:  Flat, positive bowel sounds normal in frequency in pitch, no bruits, no rebound, no guarding, no midline pulsatile mass, no hepatomegaly, no splenomegaly EXT:  2 plus pulses throughout, no edema, no cyanosis, no clubbing SKIN:  No rashes, no nodules NEURO:  Cranial nerves II through XII grossly intact, motor grossly intact throughout PSYCH:  Cognitively intact, oriented to person place and time   ASSESSMENT/PLAN:    Hypertension BP has been in the 120-150s at home.  It continues be labile.  She has seen some improvement since increasing amlodipine.  Recommend starting Tylenol for her chronic pain  so that she can be more active and less pain will also help her BP.  Increase amlodipine to '5mg'$  bid.  Continue carvedilol.  Consider spironolactone if BP remains elevated.    Coronary artery disease involving native coronary artery of native heart without angina pectoris No symptoms at this time.  Continue aspirin, atorvastatin and carvedilol.  BP control as above.  Mixed hyperlipidemia Continue atorvastatin.  LDL goal <70.  OSA (obstructive sleep apnea) She has moderate sleep apnea.  She does not have a machine.  Will reach out to our sleep medicine team to help her with next steps.  Encouraged her to use it and discussed how sleep apnea can affect her body.   Screening for Secondary Hypertension:     05/09/2022    4:46 PM  Causes  Sleep Apnea Screened  Thyroid Disease Screened  Pheochromocytoma Screened  Cushing's Syndrome Screened  Coarctation of the Aorta Screened    Relevant Labs/Studies:    Latest Ref Rng & Units 10/12/2021    7:30 PM  Basic Labs  Sodium 135 - 145 mmol/L 139   Potassium 3.5 - 5.1 mmol/L 4.0   Creatinine 0.44 - 1.00 mg/dL 0.95              Latest Ref Rng & Units 05/15/2022   10:54 AM  Metanephrines/Catecholamines   Epinephrine 0 - 62 pg/mL 41   Norepinephrine 0 - 874 pg/mL 1,091   Dopamine 0 - 48 pg/mL 32   Metanephrines 0.0 - 88.0 pg/mL 45.0   Normetanephrines  0.0 - 285.2 pg/mL 130.0           12/20/2021   10:26 AM  Renovascular   Renal Artery [place] Completed Yes    Disposition:    FU with  Zanaya Baize C. Oval Linsey, MD, [facility] in 1-2 months.  Medication Adjustments/Labs and Tests Ordered: Current medicines are reviewed at length with the patient today.  Concerns regarding medicines are outlined above.   No orders of the defined types were placed in this encounter.  Meds ordered this encounter  Medications   amLODipine (NORVASC) 5 MG tablet    Sig: Take 1 tablet (5 mg total) by mouth in the morning and at bedtime.    Dispense:  180 tablet     Refill:  3    NEW DOSE, D/C 2.5 MG RX   I,Mathew Stumpf,acting as a scribe for Skeet Latch, MD.,have documented all relevant documentation on the behalf of Skeet Latch, MD,as directed by  Skeet Latch, MD while in the presence of Skeet Latch, MD.  I, Donaldson Oval Linsey, MD have reviewed all documentation for this visit.  The documentation of the exam, diagnosis, procedures, and orders on 06/18/2022 are all accurate and complete.   Signed, Skeet Latch, MD  06/18/2022 12:46 PM    [place]

## 2022-06-18 NOTE — Assessment & Plan Note (Signed)
BP has been in the 120-150s at home.  It continues be labile.  She has seen some improvement since increasing amlodipine.  Recommend starting Tylenol for her chronic pain so that she can be more active and less pain will also help her BP.  Increase amlodipine to '5mg'$  bid.  Continue carvedilol.  Consider spironolactone if BP remains elevated.

## 2022-06-18 NOTE — Assessment & Plan Note (Signed)
Continue atorvastatin.  LDL goal <70.

## 2022-06-18 NOTE — Patient Instructions (Signed)
Medication Instructions:  INCREASE YOUR AMLODIPINE TO 5 MG TWICE A DAY   Labwork: NONE  Testing/Procedures: NONE  Follow-Up: 08/01/2022 11:00 AM WITH DR [place]   Any Other Special Instructions Will Be Listed Below (If Applicable). WILL REACH OUT TO SLEEP TEAM TO CONTACT YOU

## 2022-06-25 ENCOUNTER — Telehealth: Payer: Self-pay | Admitting: *Deleted

## 2022-06-25 DIAGNOSIS — Z794 Long term (current) use of insulin: Secondary | ICD-10-CM | POA: Diagnosis not present

## 2022-06-25 DIAGNOSIS — N182 Chronic kidney disease, stage 2 (mild): Secondary | ICD-10-CM | POA: Diagnosis not present

## 2022-06-25 DIAGNOSIS — Z7984 Long term (current) use of oral hypoglycemic drugs: Secondary | ICD-10-CM | POA: Diagnosis not present

## 2022-06-25 DIAGNOSIS — S161XXD Strain of muscle, fascia and tendon at neck level, subsequent encounter: Secondary | ICD-10-CM | POA: Diagnosis not present

## 2022-06-25 DIAGNOSIS — I2510 Atherosclerotic heart disease of native coronary artery without angina pectoris: Secondary | ICD-10-CM | POA: Diagnosis not present

## 2022-06-25 DIAGNOSIS — M25611 Stiffness of right shoulder, not elsewhere classified: Secondary | ICD-10-CM | POA: Diagnosis not present

## 2022-06-25 DIAGNOSIS — M6281 Muscle weakness (generalized): Secondary | ICD-10-CM | POA: Diagnosis not present

## 2022-06-25 DIAGNOSIS — I129 Hypertensive chronic kidney disease with stage 1 through stage 4 chronic kidney disease, or unspecified chronic kidney disease: Secondary | ICD-10-CM | POA: Diagnosis not present

## 2022-06-25 DIAGNOSIS — M25612 Stiffness of left shoulder, not elsewhere classified: Secondary | ICD-10-CM | POA: Diagnosis not present

## 2022-06-25 DIAGNOSIS — E1169 Type 2 diabetes mellitus with other specified complication: Secondary | ICD-10-CM | POA: Diagnosis not present

## 2022-06-25 DIAGNOSIS — Z7985 Long-term (current) use of injectable non-insulin antidiabetic drugs: Secondary | ICD-10-CM | POA: Diagnosis not present

## 2022-06-25 DIAGNOSIS — E1122 Type 2 diabetes mellitus with diabetic chronic kidney disease: Secondary | ICD-10-CM | POA: Diagnosis not present

## 2022-06-25 DIAGNOSIS — E1165 Type 2 diabetes mellitus with hyperglycemia: Secondary | ICD-10-CM | POA: Diagnosis not present

## 2022-06-25 DIAGNOSIS — E11649 Type 2 diabetes mellitus with hypoglycemia without coma: Secondary | ICD-10-CM | POA: Diagnosis not present

## 2022-06-25 DIAGNOSIS — E1159 Type 2 diabetes mellitus with other circulatory complications: Secondary | ICD-10-CM | POA: Diagnosis not present

## 2022-06-25 DIAGNOSIS — E1142 Type 2 diabetes mellitus with diabetic polyneuropathy: Secondary | ICD-10-CM | POA: Diagnosis not present

## 2022-06-25 NOTE — Telephone Encounter (Signed)
Left message to return a call to discuss sleep study results and recommendations. 

## 2022-07-01 ENCOUNTER — Telehealth: Payer: Medicaid Other | Admitting: Diagnostic Neuroimaging

## 2022-07-12 ENCOUNTER — Telehealth: Payer: Self-pay | Admitting: Cardiology

## 2022-07-12 DIAGNOSIS — E559 Vitamin D deficiency, unspecified: Secondary | ICD-10-CM | POA: Diagnosis not present

## 2022-07-12 DIAGNOSIS — M8589 Other specified disorders of bone density and structure, multiple sites: Secondary | ICD-10-CM | POA: Diagnosis not present

## 2022-07-12 DIAGNOSIS — M1990 Unspecified osteoarthritis, unspecified site: Secondary | ICD-10-CM | POA: Diagnosis not present

## 2022-07-12 DIAGNOSIS — E1165 Type 2 diabetes mellitus with hyperglycemia: Secondary | ICD-10-CM | POA: Diagnosis not present

## 2022-07-12 DIAGNOSIS — K219 Gastro-esophageal reflux disease without esophagitis: Secondary | ICD-10-CM | POA: Diagnosis not present

## 2022-07-12 DIAGNOSIS — Z794 Long term (current) use of insulin: Secondary | ICD-10-CM | POA: Diagnosis not present

## 2022-07-12 DIAGNOSIS — I10 Essential (primary) hypertension: Secondary | ICD-10-CM | POA: Diagnosis not present

## 2022-07-12 DIAGNOSIS — E782 Mixed hyperlipidemia: Secondary | ICD-10-CM | POA: Diagnosis not present

## 2022-07-12 DIAGNOSIS — R011 Cardiac murmur, unspecified: Secondary | ICD-10-CM | POA: Diagnosis not present

## 2022-07-12 DIAGNOSIS — Z79899 Other long term (current) drug therapy: Secondary | ICD-10-CM

## 2022-07-12 NOTE — Telephone Encounter (Signed)
Pt c/o swelling: STAT is pt has developed SOB within 24 hours  If swelling, where is the swelling located?   Feet  How much weight have you gained and in what time span?   Unknown  Have you gained 3 pounds in a day or 5 pounds in a week?   Unknown  Do you have a log of your daily weights (if so, list)?   No  Are you currently taking a fluid pill?   No  Are you currently SOB?   No  Have you traveled recently?   No   Daughter stated patient's feet have started swelling about a week ago.  Daughter stated the patient saw her PCP who believes this may be related to patient's medication.

## 2022-07-12 NOTE — Telephone Encounter (Signed)
-----   Message from Troy Sine, MD sent at 06/16/2022 11:18 AM EST ----- Mariann Laster, please notify pt. I believe she still has her CPAP machine which she has not used in over a year. If CPAP device is recent can re-set for Auto-PAP as prescribe. If old machine then may need new machine or CPAP titration evaluation

## 2022-07-12 NOTE — Telephone Encounter (Signed)
Daughter reporting SBP still elevated 147-159/ but the patient is having lower extremity swelling started when the amlodipine dose was increased to 5 mg BID by Dr. Oval Linsey on 06/18/22.  Pt's daughter aware I will forward the information to the providers and they will be called back with recommendations.  Adv to continue the meds as ordered for now.

## 2022-07-12 NOTE — Telephone Encounter (Signed)
Patient notified of HST results. APAP order sent to [facility] via [facility] portal.

## 2022-07-15 MED ORDER — SPIRONOLACTONE 25 MG PO TABS: 12.5000 mg | ORAL_TABLET | Freq: Every day | ORAL | 3 refills | Status: DC

## 2022-07-15 MED ORDER — AMLODIPINE BESYLATE 5 MG PO TABS: 5.0000 mg | ORAL_TABLET | Freq: Every day | ORAL | 3 refills | Status: DC

## 2022-07-15 NOTE — Telephone Encounter (Signed)
Let's cut the amlodipine back to 62m once a day to reduce edema. Also start spironolactone 12.517monce a day Check BMET in 2 weeks  Will forward to Dr. RaOval Linseys well. MaCandee FurbishMD    Spoke with daughter SoAlleen Borne Advised of deceasing amlodipine 5 mg to once daily and to start Spironolactone 12.5 mg daily.  Pt will have blood work 3/7 at her appt with Dr RaOval Linseyorder placed).  [facility]

## 2022-07-16 ENCOUNTER — Emergency Department (HOSPITAL_BASED_OUTPATIENT_CLINIC_OR_DEPARTMENT_OTHER): Payer: 59

## 2022-07-16 ENCOUNTER — Emergency Department (HOSPITAL_BASED_OUTPATIENT_CLINIC_OR_DEPARTMENT_OTHER): Payer: 59 | Admitting: Radiology

## 2022-07-16 ENCOUNTER — Encounter (HOSPITAL_BASED_OUTPATIENT_CLINIC_OR_DEPARTMENT_OTHER): Payer: Self-pay | Admitting: Emergency Medicine

## 2022-07-16 ENCOUNTER — Other Ambulatory Visit: Payer: Self-pay

## 2022-07-16 ENCOUNTER — Inpatient Hospital Stay (HOSPITAL_BASED_OUTPATIENT_CLINIC_OR_DEPARTMENT_OTHER)
Admission: EM | Admit: 2022-07-16 | Discharge: 2022-07-18 | DRG: 291 | Disposition: A | Discharge status: Home / Self Care | Payer: 59 | Attending: Internal Medicine | Admitting: Internal Medicine

## 2022-07-16 DIAGNOSIS — I2510 Atherosclerotic heart disease of native coronary artery without angina pectoris: Secondary | ICD-10-CM | POA: Diagnosis present

## 2022-07-16 DIAGNOSIS — Z888 Allergy status to other drugs, medicaments and biological substances status: Secondary | ICD-10-CM | POA: Diagnosis not present

## 2022-07-16 DIAGNOSIS — R011 Cardiac murmur, unspecified: Secondary | ICD-10-CM | POA: Diagnosis not present

## 2022-07-16 DIAGNOSIS — Z1152 Encounter for screening for COVID-19: Secondary | ICD-10-CM

## 2022-07-16 DIAGNOSIS — E1165 Type 2 diabetes mellitus with hyperglycemia: Secondary | ICD-10-CM | POA: Diagnosis not present

## 2022-07-16 DIAGNOSIS — I10 Essential (primary) hypertension: Secondary | ICD-10-CM | POA: Diagnosis not present

## 2022-07-16 DIAGNOSIS — J810 Acute pulmonary edema: Secondary | ICD-10-CM

## 2022-07-16 DIAGNOSIS — E782 Mixed hyperlipidemia: Secondary | ICD-10-CM | POA: Diagnosis not present

## 2022-07-16 DIAGNOSIS — Z955 Presence of coronary angioplasty implant and graft: Secondary | ICD-10-CM

## 2022-07-16 DIAGNOSIS — I5033 Acute on chronic diastolic (congestive) heart failure: Secondary | ICD-10-CM | POA: Insufficient documentation

## 2022-07-16 DIAGNOSIS — M8589 Other specified disorders of bone density and structure, multiple sites: Secondary | ICD-10-CM | POA: Diagnosis not present

## 2022-07-16 DIAGNOSIS — J9601 Acute respiratory failure with hypoxia: Secondary | ICD-10-CM | POA: Diagnosis present

## 2022-07-16 DIAGNOSIS — Z7985 Long-term (current) use of injectable non-insulin antidiabetic drugs: Secondary | ICD-10-CM

## 2022-07-16 DIAGNOSIS — E669 Obesity, unspecified: Secondary | ICD-10-CM | POA: Diagnosis present

## 2022-07-16 DIAGNOSIS — I509 Heart failure, unspecified: Secondary | ICD-10-CM

## 2022-07-16 DIAGNOSIS — Z7984 Long term (current) use of oral hypoglycemic drugs: Secondary | ICD-10-CM

## 2022-07-16 DIAGNOSIS — D638 Anemia in other chronic diseases classified elsewhere: Secondary | ICD-10-CM | POA: Diagnosis present

## 2022-07-16 DIAGNOSIS — G40909 Epilepsy, unspecified, not intractable, without status epilepticus: Secondary | ICD-10-CM

## 2022-07-16 DIAGNOSIS — Z794 Long term (current) use of insulin: Secondary | ICD-10-CM

## 2022-07-16 DIAGNOSIS — D649 Anemia, unspecified: Secondary | ICD-10-CM | POA: Diagnosis present

## 2022-07-16 DIAGNOSIS — Z6832 Body mass index (BMI) 32.0-32.9, adult: Secondary | ICD-10-CM

## 2022-07-16 DIAGNOSIS — I5030 Unspecified diastolic (congestive) heart failure: Secondary | ICD-10-CM | POA: Insufficient documentation

## 2022-07-16 DIAGNOSIS — J189 Pneumonia, unspecified organism: Secondary | ICD-10-CM

## 2022-07-16 DIAGNOSIS — G4733 Obstructive sleep apnea (adult) (pediatric): Secondary | ICD-10-CM | POA: Diagnosis present

## 2022-07-16 DIAGNOSIS — K219 Gastro-esophageal reflux disease without esophagitis: Secondary | ICD-10-CM | POA: Diagnosis not present

## 2022-07-16 DIAGNOSIS — J90 Pleural effusion, not elsewhere classified: Secondary | ICD-10-CM | POA: Diagnosis not present

## 2022-07-16 DIAGNOSIS — H9190 Unspecified hearing loss, unspecified ear: Secondary | ICD-10-CM | POA: Diagnosis present

## 2022-07-16 DIAGNOSIS — E1169 Type 2 diabetes mellitus with other specified complication: Secondary | ICD-10-CM | POA: Diagnosis present

## 2022-07-16 DIAGNOSIS — E7800 Pure hypercholesterolemia, unspecified: Secondary | ICD-10-CM | POA: Diagnosis present

## 2022-07-16 DIAGNOSIS — I5031 Acute diastolic (congestive) heart failure: Secondary | ICD-10-CM | POA: Diagnosis not present

## 2022-07-16 DIAGNOSIS — Z79899 Other long term (current) drug therapy: Secondary | ICD-10-CM | POA: Diagnosis not present

## 2022-07-16 DIAGNOSIS — M1990 Unspecified osteoarthritis, unspecified site: Secondary | ICD-10-CM | POA: Diagnosis not present

## 2022-07-16 DIAGNOSIS — I110 Hypertensive heart disease with heart failure: Principal | ICD-10-CM | POA: Diagnosis present

## 2022-07-16 DIAGNOSIS — R0602 Shortness of breath: Secondary | ICD-10-CM | POA: Diagnosis not present

## 2022-07-16 DIAGNOSIS — E559 Vitamin D deficiency, unspecified: Secondary | ICD-10-CM | POA: Diagnosis not present

## 2022-07-16 DIAGNOSIS — E66811 Obesity, class 1: Secondary | ICD-10-CM | POA: Diagnosis present

## 2022-07-16 DIAGNOSIS — R9431 Abnormal electrocardiogram [ECG] [EKG]: Secondary | ICD-10-CM | POA: Diagnosis not present

## 2022-07-16 HISTORY — DX: Unspecified diastolic (congestive) heart failure: I50.30

## 2022-07-16 LAB — CBC
HCT: 33.6 % — ABNORMAL LOW (ref 36.0–46.0)
Hemoglobin: 10.9 g/dL — ABNORMAL LOW (ref 12.0–15.0)
MCH: 27.1 pg (ref 26.0–34.0)
MCHC: 32.4 g/dL (ref 30.0–36.0)
MCV: 83.6 fL (ref 80.0–100.0)
Platelets: 232 10*3/uL (ref 150–400)
RBC: 4.02 MIL/uL (ref 3.87–5.11)
RDW: 13.2 % (ref 11.5–15.5)
WBC: 8.4 10*3/uL (ref 4.0–10.5)
nRBC: 0 % (ref 0.0–0.2)

## 2022-07-16 LAB — BRAIN NATRIURETIC PEPTIDE: B Natriuretic Peptide: 513.6 pg/mL — ABNORMAL HIGH (ref 0.0–100.0)

## 2022-07-16 LAB — BASIC METABOLIC PANEL
Anion gap: 8 (ref 5–15)
BUN: 18 mg/dL (ref 8–23)
CO2: 27 mmol/L (ref 22–32)
Calcium: 9.9 mg/dL (ref 8.9–10.3)
Chloride: 103 mmol/L (ref 98–111)
Creatinine, Ser: 0.8 mg/dL (ref 0.44–1.00)
GFR, Estimated: >60 mL/min (ref >60)
Glucose, Bld: 154 mg/dL — ABNORMAL HIGH (ref 70–99)
Potassium: 4.3 mmol/L (ref 3.5–5.1)
Sodium: 138 mmol/L (ref 135–145)

## 2022-07-16 LAB — HEPATIC FUNCTION PANEL
ALT: 15 U/L (ref 0–44)
AST: 12 U/L — ABNORMAL LOW (ref 15–41)
Albumin: 4.2 g/dL (ref 3.5–5.0)
Alkaline Phosphatase: 55 U/L (ref 38–126)
Bilirubin, Direct: 0.2 mg/dL (ref 0.0–0.2)
Indirect Bilirubin: 0.4 mg/dL (ref 0.3–0.9)
Total Bilirubin: 0.6 mg/dL (ref 0.3–1.2)
Total Protein: 7.3 g/dL (ref 6.5–8.1)

## 2022-07-16 LAB — RESP PANEL BY RT-PCR (RSV, FLU A&B, COVID)  RVPGX2
Influenza A by PCR: NEGATIVE
Influenza B by PCR: NEGATIVE
Resp Syncytial Virus by PCR: NEGATIVE
SARS Coronavirus 2 by RT PCR: NEGATIVE

## 2022-07-16 MED ORDER — SODIUM CHLORIDE 0.9 % IV SOLN
1.0000 g | Freq: Once | INTRAVENOUS | Status: AC
  Administered 2022-07-16: 1 g via INTRAVENOUS
  Filled 2022-07-16: qty 10

## 2022-07-16 MED ORDER — MORPHINE SULFATE (PF) 4 MG/ML IV SOLN
4.0000 mg | Freq: Once | INTRAVENOUS | Status: AC
  Administered 2022-07-16: 4 mg via INTRAVENOUS
  Filled 2022-07-16: qty 1

## 2022-07-16 MED ORDER — FUROSEMIDE 10 MG/ML IJ SOLN
20.0000 mg | Freq: Once | INTRAMUSCULAR | Status: AC
  Administered 2022-07-16: 20 mg via INTRAVENOUS
  Filled 2022-07-16: qty 2

## 2022-07-16 MED ORDER — SODIUM CHLORIDE 0.9 % IV SOLN
500.0000 mg | Freq: Once | INTRAVENOUS | Status: AC
  Administered 2022-07-16: 500 mg via INTRAVENOUS
  Filled 2022-07-16: qty 5

## 2022-07-16 MED ORDER — IOHEXOL 350 MG/ML SOLN
75.0000 mL | Freq: Once | INTRAVENOUS | Status: AC | PRN
  Administered 2022-07-16: 75 mL via INTRAVENOUS

## 2022-07-16 NOTE — ED Notes (Signed)
While placing purewick, pt lifted hips to aid in removing her pants, oxygen saturation went down to 88% on 5 L via [place], returned to 95% on 5L via [place]. Provider and primary RN notified.

## 2022-07-16 NOTE — ED Notes (Signed)
Patient transported to CT 

## 2022-07-16 NOTE — Plan of Care (Signed)
[facility] will assume care on arrival to accepting facility. Until arrival, care as per EDP. However, [facility] available 24/7 for questions and assistance.  Nursing staff, please page [facility] Admits and Consults ([phone]) as soon as the patient arrives to the hospital.   

## 2022-07-16 NOTE — ED Provider Notes (Signed)
[facility] Provider Note   CSN: [id number] Arrival date & time: 07/16/22  1737     History  Chief Complaint  Patient presents with   Shortness of Breath    Carolyn Little is a 77 y.o. female.  With history of hypertension, arthritis, vertigo, renal insufficiency, diabetes, OSA, CAD with stent placement in 2015 who presents to the ED for evaluation of shortness of breath.  She states 2 days ago she developed a headache.  The next day she developed some lightheadedness when changing positions and body aches.  Today she woke up at approximately 5:00 this morning and noted shortness of breath at rest and with activity.  It is not normal for her.  She also endorses bilateral lower extremity swelling which has been present for the past week.  She saw her primary care provider for this who started her on spironolactone.  She does endorse recently adding amlodipine to her medication list.  Also endorses chills.  She denies history of DVT or PE.  Denies chest pain, cough, fevers, rigors.  History is given by the patient and daughter with the assistance of a Spanish interpreter   Shortness of Breath      Home Medications Prior to Admission medications   Medication Sig Start Date End Date Taking? Authorizing Provider  ACCU-CHEK GUIDE test strip as directed as directed 01/30/21   [provider]  amLODipine (NORVASC) 5 MG tablet Take 1 tablet (5 mg total) by mouth daily. 07/15/22   Jerline Pain, MD  atorvastatin (LIPITOR) 40 MG tablet Take 1 tablet (40 mg total) by mouth daily. 01/10/22   Jerline Pain, MD  Blood Glucose Monitoring Suppl (ACCU-CHEK GUIDE) w/Device KIT  02/11/20   [provider]  Blood Glucose Monitoring Suppl (GLUCOCOM BLOOD GLUCOSE MONITOR) DEVI 1 Device by [place].(Non-Drug; Combo Route) route as needed (to check blood glucose). 12/22/20   [provider]  Blood Glucose Monitoring Suppl ([place]) w/Device KIT 1 DEVICE BY MISC.(NON-DRUG COMBO ROUTE) ROUTE AS NEEDED (TO CHECK BLOOD GLUCOSE). 12/22/20   [provider]  carvedilol (COREG) 25 MG tablet Take 0.5 tablets (12.5 mg total) by mouth 2 (two) times daily with a meal. 03/25/22   Jerline Pain, MD  celecoxib (CELEBREX) 100 MG capsule Take 100 mg by mouth daily. 03/26/22   [provider]  Continuous Blood Gluc Receiver (DEXCOM G6 RECEIVER) DEVI Use as directed for continuous glucose monitoring. 03/17/20   [provider]  Continuous Blood Gluc Sensor (DEXCOM G6 SENSOR) MISC Inject 1 sensor to the skin every 10 days for continuous glucose monitoring. 10/05/20   [provider]  Continuous Blood Gluc Transmit (DEXCOM G6 TRANSMITTER) MISC Use as directed for continuous glucose monitoring. Reuse transmitter for 90 days then discard and replace. 10/05/20   [provider]  [facility] ASPIRIN ADULT LOW DOSE 81 MG chewable tablet Chew 81 mg by mouth daily. 01/07/20   [provider]  diclofenac Sodium (VOLTAREN) 1 % GEL SMARTSIG:Gram(s) Topical 4 Times Daily 01/28/20   [provider]  erythromycin ophthalmic ointment SMARTSIG:1 Sparingly Left Eye Every Evening 01/23/21   [provider]  famotidine (PEPCID) 40 MG tablet Take 40 mg by mouth daily.    [provider]  glucose blood (ONETOUCH VERIO) test strip To monitor sugar 4 times per day 12/22/20   [provider]  glucose blood (PRECISION QID TEST) test strip To monitor sugar 4 times per day  03/17/20   [provider]  insulin glargine (LANTUS) 100 UNIT/ML injection Inject 35 Units into the skin daily.    [provider]  Insulin Pen Needle (BD PEN NEEDLE NANO U/F) 32G X 4 MM MISC Use once daily for [place] and once weekly for [place] 123456   [provider]  lamoTRIgine (LAMICTAL) 150 MG tablet TAKE 1 TABLET BY MOUTH TWICE A DAY 04/15/22   Penumalli, Earlean Polka, MD  Lancets  (ONETOUCH DELICA PLUS Q000111Q) MISC 1 DEVICE BY MISC.(NON-DRUG COMBO ROUTE) ROUTE 4 TIMES DAILY. 03/07/21   [provider]  levETIRAcetam (KEPPRA) 500 MG tablet Take 1 tablet (500 mg total) by mouth daily. 10/03/21   Penumalli, Earlean Polka, MD  meclizine (ANTIVERT) 25 MG tablet Take 25 mg by mouth 3 (three) times daily as needed for dizziness.    [provider]  metFORMIN (GLUCOPHAGE) 500 MG tablet Take 500 mg by mouth 2 (two) times daily. 12/06/19   [provider]  methocarbamol (ROBAXIN) 500 MG tablet Take 500 mg by mouth at bedtime as needed. 03/26/22   [provider]  [facility] VERIO test strip 4 (four) times daily. 03/01/21   [provider]  SIMBRINZA 1-0.2 % SUSP Apply 1 drop to eye 2 (two) times daily. 05/23/22   [provider]  spironolactone (ALDACTONE) 25 MG tablet Take 0.5 tablets (12.5 mg total) by mouth daily. 07/15/22   Jerline Pain, MD  TOBRADEX ophthalmic ointment SMARTSIG:1 sparingly In Eye(s) Every Night 02/15/21   [provider]  TRULICITY 1.5 0000000 SOPN Inject into the skin. 30 units weekly 06/25/21   [provider]  Vitamin D, Ergocalciferol, (DRISDOL) 1.25 MG (50000 UNIT) CAPS capsule Take 50,000 Units by mouth once a week. 01/03/20   [provider]      Allergies    Levaquin [levofloxacin]    Review of Systems   Review of Systems  Constitutional:  Positive for chills.  Respiratory:  Positive for shortness of breath.   All other systems reviewed and are negative.   Physical Exam Updated Vital Signs BP (!) 147/64   Pulse 63   Temp 97.6 F (36.4 C) (Oral)   Resp 17   SpO2 97%  Physical Exam Vitals and nursing note reviewed.  Constitutional:      General: She is not in acute distress.    Appearance: She is well-developed.     Comments: Resting comfortably in bed  HENT:     Head: Normocephalic and atraumatic.     Mouth/Throat:     Mouth: Mucous membranes are moist.      Pharynx: Oropharynx is clear. No pharyngeal swelling or oropharyngeal exudate.  Eyes:     Conjunctiva/sclera: Conjunctivae normal.  Cardiovascular:     Rate and Rhythm: Normal rate and regular rhythm.     Heart sounds: No murmur heard. Pulmonary:     Effort: Pulmonary effort is normal. No tachypnea or respiratory distress.     Breath sounds: Examination of the left-middle field reveals rales. Examination of the left-lower field reveals rales. Rales present. No decreased breath sounds, wheezing or rhonchi.  Abdominal:     Palpations: Abdomen is soft.     Tenderness: There is no abdominal tenderness.  Musculoskeletal:        General: No swelling.     Cervical back: Neck supple.     Right lower leg: Edema present.     Left lower leg: Edema present.     Comments: Bilateral 1+ pitting  edema to just below the knees  Skin:    General: Skin is warm and dry.     Capillary Refill: Capillary refill takes less than 2 seconds.  Neurological:     General: No focal deficit present.     Mental Status: She is alert and oriented to person, place, and time.  Psychiatric:        Mood and Affect: Mood normal.     ED Results / Procedures / Treatments   Labs (all labs ordered are listed, but only abnormal results are displayed) Labs Reviewed  BASIC METABOLIC PANEL - Abnormal; Notable for the following components:      Result Value   Glucose, Bld 154 (*)    All other components within normal limits  CBC - Abnormal; Notable for the following components:   Hemoglobin 10.9 (*)    HCT 33.6 (*)    All other components within normal limits  BRAIN NATRIURETIC PEPTIDE - Abnormal; Notable for the following components:   B Natriuretic Peptide 513.6 (*)    All other components within normal limits  HEPATIC FUNCTION PANEL - Abnormal; Notable for the following components:   AST 12 (*)    All other components within normal limits  RESP PANEL BY RT-PCR (RSV, FLU A&B, COVID)  [id number]    EKG EKG  Interpretation  Date/Time:  Tuesday July 16 2022 18:10:36 EST Ventricular Rate:  74 PR Interval:  164 QRS Duration: 78 QT Interval:  390 QTC Calculation: 432 R Axis:   78 Text Interpretation: Normal sinus rhythm Septal infarct (cited on or before 12-Oct-2021) Similar to prior Confirmed by Cindee Lame [phone]) on 07/16/2022 6:40:47 PM  Radiology CT Angio Chest PE W/Cm &/Or Wo Cm  Result Date: 07/16/2022 CLINICAL DATA:  Pulmonary embolism (PE) suspected, high prob. EXAM: CT ANGIOGRAPHY CHEST WITH CONTRAST TECHNIQUE: Multidetector CT imaging of the chest was performed using the standard protocol during bolus administration of intravenous contrast. Multiplanar CT image reconstructions and MIPs were obtained to evaluate the vascular anatomy. RADIATION DOSE REDUCTION: This exam was performed according to the departmental dose-optimization program which includes automated exposure control, adjustment of the mA and/or kV according to patient size and/or use of iterative reconstruction technique. CONTRAST:  28m OMNIPAQUE IOHEXOL 350 MG/ML SOLN COMPARISON:  None Available. FINDINGS: Cardiovascular: Contrast injection is sufficient to demonstrate satisfactory opacification of the pulmonary arteries to the segmental level. There is no pulmonary embolus or evidence of right heart strain. The size of the main pulmonary artery is normal. There are coronary artery calcifications and calcific aortic atherosclerosis. No pericardial effusion. Mediastinum/Nodes: No mediastinal, hilar or axillary lymphadenopathy. Normal visualized thyroid. Thoracic esophageal course is normal. Lungs/Pleura: Bilateral parahilar ground-glass opacities, likely pulmonary edema. Small pleural effusions. Upper Abdomen: Contrast bolus timing is not optimized for evaluation of the abdominal organs. The visualized portions of the organs of the upper abdomen are normal. Musculoskeletal: No chest wall abnormality. No bony spinal canal stenosis.  Review of the MIP images confirms the above findings. IMPRESSION: 1. No pulmonary embolus. 2. Bilateral parahilar ground-glass opacities, likely pulmonary edema. 3. Small pleural effusions. Aortic Atherosclerosis (ICD10-I70.0). Electronically Signed   By: KUlyses JarredM.D.   On: 07/16/2022 20:07   DG Chest 2 View  Result Date: 07/16/2022 CLINICAL DATA:  Shortness of breath EXAM: CHEST - 2 VIEW COMPARISON:  None Available. FINDINGS: Small bilateral pleural effusions. Borderline cardiac enlargement with vascular congestion. Heterogeneous left greater than right airspace disease. Aortic atherosclerosis. No pneumothorax. IMPRESSION: Small bilateral pleural effusions.  Heterogeneous left greater than right airspace disease, suspicious for bilateral pneumonia. Slightly nodular appearance of consolidative opacities on the left, imaging follow-up to resolution is recommended. Electronically Signed   By: Donavan Foil M.D.   On: 07/16/2022 18:39    Procedures .Critical Care  Performed by: Roylene Reason, PA-C Authorized by: Roylene Reason, PA-C   Critical care provider statement:    Critical care time (minutes):  44   Critical care was necessary to treat or prevent imminent or life-threatening deterioration of the following conditions:  Respiratory failure   Critical care was time spent personally by me on the following activities:  Development of treatment plan with patient or surrogate, discussions with consultants, evaluation of patient's response to treatment, examination of patient, ordering and review of laboratory studies, ordering and review of radiographic studies, ordering and performing treatments and interventions, pulse oximetry, re-evaluation of patient's condition and review of old charts   Care discussed with: admitting provider   Comments:     New oxygen requirement     Medications Ordered in ED Medications  iohexol (OMNIPAQUE) 350 MG/ML injection 75 mL (75 mLs Intravenous  Contrast Given 07/16/22 1956)  morphine (PF) 4 MG/ML injection 4 mg (4 mg Intravenous Given 07/16/22 2110)  cefTRIAXone (ROCEPHIN) 1 g in sodium chloride 0.9 % 100 mL IVPB (0 g Intravenous Stopped 07/16/22 2203)  azithromycin (ZITHROMAX) 500 mg in sodium chloride 0.9 % 250 mL IVPB (500 mg Intravenous New Bag/Given 07/16/22 2205)  furosemide (LASIX) injection 20 mg (20 mg Intravenous Given 07/16/22 2107)    ED Course/ Medical Decision Making/ A&P Clinical Course as of 07/16/22 2311  Tue Jul 16, 2022  [place] score 106 [AS]  2048 Patient requiring supplemental oxygen, found to be 90% on room air [AS]  2127 Spoke with hospitalist Dr. Marlowe Sax.  Patient will be admitted [AS]    Clinical Course User Index [AS] Ines Rebel, [place] Folk, PA-C                             Medical Decision Making Amount and/or Complexity of Data Reviewed Labs: ordered. Radiology: ordered.  Risk Prescription drug management. Decision regarding hospitalization.  This patient presents to the ED for concern of shortness of breath, this involves an extensive number of treatment options, and is a complaint that carries with it a high risk of complications and morbidity.  The emergent differential diagnosis for shortness of breath includes, but is not limited to, Pulmonary edema, bronchoconstriction, Pneumonia, Pulmonary embolism, Pneumotherax/ Hemothorax, Dysrythmia, ACS.    Co morbidities that complicate the patient evaluation  hypertension, arthritis, vertigo, renal insufficiency, diabetes, OSA  My initial workup includesLabs, chest x-ray, PE study  Additional history obtained from: Nursing notes from this visit. Previous records within EMR system echo on 12/13/2021 with an ejection fraction of 70 to 75% Family daughter provides a portion of the history  I ordered, reviewed and interpreted labs which include: CBC, CMP, respiratory panel, BNP.  BNP elevated to 513.6.  Slight anemia of 10.9.  Hyperglycemia 154.   I  ordered imaging studies including chest x-ray, PE study I independently visualized and interpreted imaging which showed bilateral for left lower send right infiltrates suspicious for pneumonia.  PE negative for PE but does show pulmonary edema with bilateral perihilar opacities I agree with the radiologist interpretation  Cardiac Monitoring:  The patient was maintained on a cardiac monitor.  I personally viewed and interpreted the cardiac monitored  which showed an underlying rhythm of: NSR  Consultations Obtained:  I requested consultation with the hospitalist Dr. Marlowe Sax,  and discussed lab and imaging findings as well as pertinent plan - they recommend: Admission, diuresis, IV antibiotics  Afebrile, hypoxic on room air to 90% and slightly hypertensive.  77 year old female presenting to the ED for evaluation of shortness of breath.  On exam, she does have minimally increased work of breathing.  She has rales to the left lower lobe.  She does have bilateral 2+ pitting edema to the level of the knees.  Her lab workup was significant for an elevated BNP with no baseline for comparison.  Chest x-ray shows possible pneumonia.  PE study shows pulmonary edema.  Patient will be treated for pneumonia with antibiotics and a port score of 106.  She will also be treated for possible CHF with IV Lasix.  She does not have a diagnosis of CHF and her last echo in July 2023 showed an ejection fraction of 70 to 75%.  She was requiring supplemental oxygen today.  She will be admitted for pulmonary edema and possible pneumonia.  Stable at the time of admission  Patient's case discussed with Dr. Mayra Neer who agrees with plan to admit.   Note: Portions of this report may have been transcribed using voice recognition software. Every effort was made to ensure accuracy; however, inadvertent computerized transcription errors may still be present.        Final Clinical Impression(s) / ED Diagnoses Final diagnoses:   Acute pulmonary edema ([place])  Community acquired pneumonia of left lower lobe of lung    Rx / DC Orders ED Discharge Orders     None         Roylene Reason, Hershal Coria 07/16/22 2312    Audley Hose, MD 07/17/22 1056

## 2022-07-16 NOTE — ED Triage Notes (Signed)
Pts daughter reports that pt has been [facility] since last night. Daughter reports pt has swelling in her legs. Pt also reporting body aches.

## 2022-07-17 DIAGNOSIS — E782 Mixed hyperlipidemia: Secondary | ICD-10-CM

## 2022-07-17 DIAGNOSIS — I5031 Acute diastolic (congestive) heart failure: Secondary | ICD-10-CM

## 2022-07-17 DIAGNOSIS — D649 Anemia, unspecified: Secondary | ICD-10-CM | POA: Diagnosis present

## 2022-07-17 DIAGNOSIS — I10 Essential (primary) hypertension: Secondary | ICD-10-CM | POA: Diagnosis not present

## 2022-07-17 DIAGNOSIS — D638 Anemia in other chronic diseases classified elsewhere: Secondary | ICD-10-CM | POA: Diagnosis present

## 2022-07-17 DIAGNOSIS — Z794 Long term (current) use of insulin: Secondary | ICD-10-CM | POA: Diagnosis not present

## 2022-07-17 DIAGNOSIS — J9601 Acute respiratory failure with hypoxia: Secondary | ICD-10-CM | POA: Insufficient documentation

## 2022-07-17 DIAGNOSIS — J189 Pneumonia, unspecified organism: Secondary | ICD-10-CM | POA: Diagnosis not present

## 2022-07-17 DIAGNOSIS — I110 Hypertensive heart disease with heart failure: Secondary | ICD-10-CM | POA: Diagnosis present

## 2022-07-17 DIAGNOSIS — G40909 Epilepsy, unspecified, not intractable, without status epilepticus: Secondary | ICD-10-CM | POA: Diagnosis present

## 2022-07-17 DIAGNOSIS — Z1152 Encounter for screening for COVID-19: Secondary | ICD-10-CM | POA: Diagnosis not present

## 2022-07-17 DIAGNOSIS — I5033 Acute on chronic diastolic (congestive) heart failure: Secondary | ICD-10-CM | POA: Diagnosis not present

## 2022-07-17 DIAGNOSIS — Z7985 Long-term (current) use of injectable non-insulin antidiabetic drugs: Secondary | ICD-10-CM | POA: Diagnosis not present

## 2022-07-17 DIAGNOSIS — Z888 Allergy status to other drugs, medicaments and biological substances status: Secondary | ICD-10-CM | POA: Diagnosis not present

## 2022-07-17 DIAGNOSIS — E1165 Type 2 diabetes mellitus with hyperglycemia: Secondary | ICD-10-CM | POA: Diagnosis not present

## 2022-07-17 DIAGNOSIS — Z7984 Long term (current) use of oral hypoglycemic drugs: Secondary | ICD-10-CM | POA: Diagnosis not present

## 2022-07-17 DIAGNOSIS — E669 Obesity, unspecified: Secondary | ICD-10-CM | POA: Diagnosis present

## 2022-07-17 DIAGNOSIS — Z79899 Other long term (current) drug therapy: Secondary | ICD-10-CM | POA: Diagnosis not present

## 2022-07-17 DIAGNOSIS — Z6832 Body mass index (BMI) 32.0-32.9, adult: Secondary | ICD-10-CM | POA: Diagnosis not present

## 2022-07-17 DIAGNOSIS — I2510 Atherosclerotic heart disease of native coronary artery without angina pectoris: Secondary | ICD-10-CM | POA: Diagnosis present

## 2022-07-17 DIAGNOSIS — G4733 Obstructive sleep apnea (adult) (pediatric): Secondary | ICD-10-CM | POA: Diagnosis not present

## 2022-07-17 DIAGNOSIS — H9190 Unspecified hearing loss, unspecified ear: Secondary | ICD-10-CM | POA: Diagnosis present

## 2022-07-17 DIAGNOSIS — E7800 Pure hypercholesterolemia, unspecified: Secondary | ICD-10-CM | POA: Diagnosis present

## 2022-07-17 DIAGNOSIS — Z955 Presence of coronary angioplasty implant and graft: Secondary | ICD-10-CM | POA: Diagnosis not present

## 2022-07-17 DIAGNOSIS — J810 Acute pulmonary edema: Secondary | ICD-10-CM | POA: Diagnosis present

## 2022-07-17 LAB — BASIC METABOLIC PANEL
Anion gap: 9 (ref 5–15)
BUN: 14 mg/dL (ref 8–23)
CO2: 26 mmol/L (ref 22–32)
Calcium: 9.4 mg/dL (ref 8.9–10.3)
Chloride: 104 mmol/L (ref 98–111)
Creatinine, Ser: 0.89 mg/dL (ref 0.44–1.00)
GFR, Estimated: >60 mL/min (ref >60)
Glucose, Bld: 138 mg/dL — ABNORMAL HIGH (ref 70–99)
Potassium: 3.9 mmol/L (ref 3.5–5.1)
Sodium: 139 mmol/L (ref 135–145)

## 2022-07-17 LAB — CBC
HCT: 33.3 % — ABNORMAL LOW (ref 36.0–46.0)
Hemoglobin: 10.5 g/dL — ABNORMAL LOW (ref 12.0–15.0)
MCH: 26.5 pg (ref 26.0–34.0)
MCHC: 31.5 g/dL (ref 30.0–36.0)
MCV: 84.1 fL (ref 80.0–100.0)
Platelets: 278 10*3/uL (ref 150–400)
RBC: 3.96 MIL/uL (ref 3.87–5.11)
RDW: 13 % (ref 11.5–15.5)
WBC: 8.4 10*3/uL (ref 4.0–10.5)
nRBC: 0 % (ref 0.0–0.2)

## 2022-07-17 LAB — PROTIME-INR
INR: 1.1 (ref 0.8–1.2)
Prothrombin Time: 14.4 seconds (ref 11.4–15.2)

## 2022-07-17 LAB — GLUCOSE, CAPILLARY
Glucose-Capillary: 219 mg/dL — ABNORMAL HIGH (ref 70–99)
Glucose-Capillary: 114 mg/dL — ABNORMAL HIGH (ref 70–99)
Glucose-Capillary: 280 mg/dL — ABNORMAL HIGH (ref 70–99)

## 2022-07-17 LAB — APTT: aPTT: 36 seconds (ref 24–36)

## 2022-07-17 LAB — PROCALCITONIN: Procalcitonin: <0.1 ng/mL

## 2022-07-17 MED ORDER — FUROSEMIDE 10 MG/ML IJ SOLN
40.0000 mg | Freq: Two times a day (BID) | INTRAMUSCULAR | Status: DC
  Administered 2022-07-17 – 2022-07-18 (×3): 40 mg via INTRAVENOUS
  Filled 2022-07-17 (×3): qty 4

## 2022-07-17 MED ORDER — BRIMONIDINE TARTRATE 0.2 % OP SOLN
1.0000 [drp] | Freq: Two times a day (BID) | OPHTHALMIC | Status: DC
  Administered 2022-07-17 – 2022-07-18 (×3): 1 [drp] via OPHTHALMIC
  Filled 2022-07-17: qty 5

## 2022-07-17 MED ORDER — ACETAMINOPHEN 325 MG PO TABS: 650.0000 mg | ORAL_TABLET | Freq: Four times a day (QID) | ORAL | Status: DC | PRN

## 2022-07-17 MED ORDER — ACETAMINOPHEN 650 MG RE SUPP: 650.0000 mg | Freq: Four times a day (QID) | RECTAL | Status: DC | PRN

## 2022-07-17 MED ORDER — FAMOTIDINE 20 MG PO TABS
40.0000 mg | ORAL_TABLET | Freq: Every day | ORAL | Status: DC
  Administered 2022-07-17 – 2022-07-18 (×2): 40 mg via ORAL
  Filled 2022-07-17 (×2): qty 2

## 2022-07-17 MED ORDER — AMLODIPINE BESYLATE 5 MG PO TABS
5.0000 mg | ORAL_TABLET | Freq: Every day | ORAL | Status: DC
  Administered 2022-07-17 – 2022-07-18 (×2): 5 mg via ORAL
  Filled 2022-07-17 (×2): qty 1

## 2022-07-17 MED ORDER — TOBRAMYCIN-DEXAMETHASONE 0.3-0.1 % OP OINT: TOPICAL_OINTMENT | Freq: Every day | OPHTHALMIC | Status: DC

## 2022-07-17 MED ORDER — INSULIN ASPART 100 UNIT/ML IJ SOLN
0.0000 [IU] | Freq: Three times a day (TID) | INTRAMUSCULAR | Status: DC
  Administered 2022-07-17: 5 [IU] via SUBCUTANEOUS
  Administered 2022-07-18: 2 [IU] via SUBCUTANEOUS
  Administered 2022-07-18: 5 [IU] via SUBCUTANEOUS

## 2022-07-17 MED ORDER — CARVEDILOL 12.5 MG PO TABS
25.0000 mg | ORAL_TABLET | Freq: Two times a day (BID) | ORAL | Status: DC
  Administered 2022-07-17 – 2022-07-18 (×2): 25 mg via ORAL
  Filled 2022-07-17 (×2): qty 2

## 2022-07-17 MED ORDER — INSULIN ASPART 100 UNIT/ML IJ SOLN
0.0000 [IU] | Freq: Every day | INTRAMUSCULAR | Status: DC
  Administered 2022-07-17: 3 [IU] via SUBCUTANEOUS

## 2022-07-17 MED ORDER — LAMOTRIGINE 25 MG PO TABS
150.0000 mg | ORAL_TABLET | Freq: Two times a day (BID) | ORAL | Status: DC
  Administered 2022-07-17 – 2022-07-18 (×3): 150 mg via ORAL
  Filled 2022-07-17 (×3): qty 2

## 2022-07-17 MED ORDER — IRBESARTAN 75 MG PO TABS
75.0000 mg | ORAL_TABLET | Freq: Every day | ORAL | Status: DC
  Administered 2022-07-17 – 2022-07-18 (×2): 75 mg via ORAL
  Filled 2022-07-17 (×2): qty 1

## 2022-07-17 MED ORDER — BRINZOLAMIDE 1 % OP SUSP
1.0000 [drp] | Freq: Two times a day (BID) | OPHTHALMIC | Status: DC
  Administered 2022-07-17 – 2022-07-18 (×3): 1 [drp] via OPHTHALMIC
  Filled 2022-07-17: qty 10

## 2022-07-17 MED ORDER — ATORVASTATIN CALCIUM 40 MG PO TABS
40.0000 mg | ORAL_TABLET | Freq: Every day | ORAL | Status: DC
  Administered 2022-07-17 – 2022-07-18 (×2): 40 mg via ORAL
  Filled 2022-07-17 (×2): qty 1

## 2022-07-17 MED ORDER — ASPIRIN 81 MG PO CHEW
81.0000 mg | CHEWABLE_TABLET | Freq: Every day | ORAL | Status: DC
  Administered 2022-07-17 – 2022-07-18 (×2): 81 mg via ORAL
  Filled 2022-07-17 (×2): qty 1

## 2022-07-17 MED ORDER — LEVETIRACETAM 500 MG PO TABS
500.0000 mg | ORAL_TABLET | Freq: Every day | ORAL | Status: DC
  Administered 2022-07-17 – 2022-07-18 (×2): 500 mg via ORAL
  Filled 2022-07-17 (×2): qty 1

## 2022-07-17 MED ORDER — SODIUM CHLORIDE 0.9% FLUSH
3.0000 mL | Freq: Two times a day (BID) | INTRAVENOUS | Status: DC
  Administered 2022-07-17 – 2022-07-18 (×3): 3 mL via INTRAVENOUS

## 2022-07-17 MED ORDER — ALBUTEROL SULFATE (2.5 MG/3ML) 0.083% IN NEBU: 2.5000 mg | INHALATION_SOLUTION | Freq: Four times a day (QID) | RESPIRATORY_TRACT | Status: DC | PRN

## 2022-07-17 NOTE — Plan of Care (Signed)

## 2022-07-17 NOTE — ED Notes (Signed)
Daughter, Alleen Borne, notified of pt bed assignment per pt request

## 2022-07-17 NOTE — Consult Note (Addendum)
Cardiology Consultation   Patient ID: Carolyn Little MRN: [id number]; DOB: November 15, 1945  Admit date: 07/16/2022 Date of Consult: 07/17/2022  PCP:  Benito Mccreedy, MD   Advance Providers Cardiologist:  [name], MD        Patient Profile:   Carolyn Little is a 77 y.o. female with a hx of CAD s/p stenting in 2015, carotid artery disease, HTN, HLD, DM2, and obstructive sleep apnea not on CPAP therapy who is being seen 07/17/2022 for the evaluation of pulmonary edema at the request of Dr. Tamala Julian.  History of Present Illness:   Carolyn Little is a 77 year old female with past medical history of CAD s/p stenting in 2015, carotid artery disease, HTN, HLD, DM2, and obstructive sleep apnea not on CPAP therapy.  Echocardiogram obtained on 12/13/2021 demonstrated EF 70 to 75%, mild LVH, grade 1 DD, mild MR, moderate to severe mitral annular calcification, normal RV. She was last seen by Dr. Marlou Porch in June 2023 for labile hypertension.  She was referred to hypertension clinic.  Renal artery Doppler obtained in July 2023 showed no sign of significant renal artery stenosis.  Patient was last seen by Dr. Oval Linsey on 06/18/2022 at which time her blood pressure was 148/62.  Carotid artery ultrasound revealed moderate disease on the right side, mild disease on the left side.  While at home, her systolic blood pressure has been ranging in the 130s to 150s.  In the past few weeks, she has been having some left lower extremity swelling.  She has not started on CPAP therapy yet.  She was in her usual state of health until 3 days ago when she started having headache and dizziness upon standing.    Yesterday, she woke up and had shortness of breath with minimal exertion.  This prompted her to seek urgent medical attention at [facility].  On arrival, her blood pressure was 178/62.  She was tachypneic at 22 breaths/min.  O2 saturation was 94%.  He lab work shows normal renal function and electrolyte.   BNP was elevated at 513.6.  Hemoglobin 10.9.  Chest x-ray showed small bilateral pleural effusions, left greater than right airspace disease suspicious for bilateral pneumonia.  Procalcitonin was negative.  Viral panel negative.  Subsequent CTA was negative for PE however does show bilateral perihilar groundglass opacities suggestive of pulmonary edema, there was also small bilateral pleural effusions.  She was given 20 mg IV Lasix in the ED last night and started on 40 mg twice daily of IV Lasix this morning.  So far she beat out of 1 L of fluid, her breathing has significantly improved.  Cardiology service consulted for pulmonary edema.   Past Medical History:  Diagnosis Date   Arthritis    Dizziness    DM (diabetes mellitus) ([place])    Generalized headaches    Hearing loss    High cholesterol    Hypertension    Low back pain    Obesity    OSA (obstructive sleep apnea) 06/18/2022   Renal insufficiency    Seizure disorder ([place])     Past Surgical History:  Procedure Laterality Date   BACK SURGERY     BRAIN TUMOR EXCISION  1984   HAND SURGERY     LAPAROSCOPIC HYSTERECTOMY     SHOULDER SURGERY       Home Medications:  Prior to Admission medications   Medication Sig Start Date End Date Taking? Authorizing Provider  acetaminophen (TYLENOL) 325 MG tablet Take 325-650 mg  by mouth every 6 (six) hours as needed.   Yes [provider]  celecoxib (CELEBREX) 100 MG capsule Take 100 mg by mouth daily as needed for mild pain. 03/26/22  Yes [provider]  ibuprofen (ADVIL) 200 MG tablet Take 200 mg by mouth every 6 (six) hours as needed for headache or mild pain.   Yes [provider]  Insulin Degludec (TRESIBA) 100 UNIT/ML SOLN Inject 20 Units into the skin every evening.   Yes [provider]  ACCU-CHEK GUIDE test strip as directed as directed 01/30/21   [provider]  amLODipine (NORVASC) 5 MG tablet Take 1 tablet (5 mg total) by mouth daily.  07/15/22   Jerline Pain, MD  atorvastatin (LIPITOR) 40 MG tablet Take 1 tablet (40 mg total) by mouth daily. 01/10/22   Jerline Pain, MD  Blood Glucose Monitoring Suppl (ACCU-CHEK GUIDE) w/Device KIT  02/11/20   [provider]  Blood Glucose Monitoring Suppl (GLUCOCOM BLOOD GLUCOSE MONITOR) DEVI 1 Device by [place].(Non-Drug; Combo Route) route as needed (to check blood glucose). 12/22/20   [provider]  Blood Glucose Monitoring Suppl ([place]) w/Device KIT 1 DEVICE BY MISC.(NON-DRUG COMBO ROUTE) ROUTE AS NEEDED (TO CHECK BLOOD GLUCOSE). 12/22/20   [provider]  carvedilol (COREG) 25 MG tablet Take 25 mg by mouth 2 (two) times daily with a meal. 03/25/22   Jerline Pain, MD  Continuous Blood Gluc Receiver (DEXCOM G6 RECEIVER) DEVI Use as directed for continuous glucose monitoring. 03/17/20   [provider]  Continuous Blood Gluc Sensor (DEXCOM G6 SENSOR) MISC Inject 1 sensor to the skin every 10 days for continuous glucose monitoring. 10/05/20   [provider]  Continuous Blood Gluc Transmit (DEXCOM G6 TRANSMITTER) MISC Use as directed for continuous glucose monitoring. Reuse transmitter for 90 days then discard and replace. 10/05/20   [provider]  [facility] ASPIRIN ADULT LOW DOSE 81 MG chewable tablet Chew 81 mg by mouth daily. 01/07/20   [provider]  diclofenac Sodium (VOLTAREN) 1 % GEL Apply 2 g topically 4 (four) times daily as needed. 01/28/20   [provider]  DIOVAN HCT 80-12.5 MG tablet Take 1 tablet by mouth daily. Patient not taking: Reported on 07/17/2022    [provider]  Dulaglutide (TRULICITY) 3 0000000 SOPN Inject 3 mg into the skin every 7 (seven) days.    [provider]  famotidine (PEPCID) 40 MG tablet Take 40 mg by mouth daily.    [provider]  glucose blood (ONETOUCH VERIO) test strip To monitor sugar 4 times per day 12/22/20   [provider]   Insulin Pen Needle (BD PEN NEEDLE NANO U/F) 32G X 4 MM MISC Use once daily for [place] and once weekly for Trul[place] 123456   [provider]  lamoTRIgine (LAMICTAL) 150 MG tablet TAKE 1 TABLET BY MOUTH TWICE A DAY 04/15/22   Penumalli, Earlean Polka, MD  Lancets (ONETOUCH DELICA PLUS Q000111Q) MISC 1 DEVICE BY MISC.(NON-DRUG COMBO ROUTE) ROUTE 4 TIMES DAILY. 03/07/21   [provider]  levETIRAcetam (KEPPRA) 500 MG tablet Take 1 tablet (500 mg total) by mouth daily. 10/03/21   Penumalli, Earlean Polka, MD  metFORMIN (GLUCOPHAGE) 500 MG tablet Take 500 mg by mouth 2 (two) times daily. 12/06/19   [provider]  [facility] VERIO test strip 4 (four) times daily. 03/01/21   [provider]  SIMBRINZA 1-0.2 % SUSP Apply 1 drop to eye 2 (  two) times daily. 05/23/22   [provider]  spironolactone (ALDACTONE) 25 MG tablet Take 0.5 tablets (12.5 mg total) by mouth daily. 07/15/22   Jerline Pain, MD    Inpatient Medications: Scheduled Meds:  amLODipine  5 mg Oral Daily   aspirin  81 mg Oral Daily   atorvastatin  40 mg Oral Daily   brinzolamide  1 drop Both Eyes BID   And   brimonidine  1 drop Both Eyes BID   carvedilol  25 mg Oral BID WC   famotidine  40 mg Oral Daily   furosemide  40 mg Intravenous BID   insulin aspart  0-15 Units Subcutaneous TID WC   insulin aspart  0-5 Units Subcutaneous QHS   irbesartan  75 mg Oral Daily   lamoTRIgine  150 mg Oral BID   levETIRAcetam  500 mg Oral Daily   sodium chloride flush  3 mL Intravenous Q12H   Continuous Infusions:  PRN Meds: acetaminophen **OR** acetaminophen, albuterol  Allergies:    Allergies  Allergen Reactions   Levaquin [Levofloxacin] Hives    Social History:   Social History   Socioeconomic History   Marital status: Widowed    Spouse name: Not on file   Number of children: 4   Years of education: Not on file   Highest education level: Not on file  Occupational History   Not on file   Tobacco Use   Smoking status: Never   Smokeless tobacco: Never  Vaping Use   Vaping Use: Never used  Substance and Sexual Activity   Alcohol use: Not Currently   Drug use: Never   Sexual activity: Not on file  Other Topics Concern   Not on file  Social History Narrative   Lives alone   Social Determinants of Health   Financial Resource Strain: Not on file  Food Insecurity: No Food Insecurity (07/17/2022)   Hunger Vital Sign    Worried About Running Out of Food in the Last Year: Never true    Ran Out of Food in the Last Year: Never true  Transportation Needs: No Transportation Needs (07/17/2022)   PRAPARE - Hydrologist (Medical): No    Lack of Transportation (Non-Medical): No  Physical Activity: Insufficiently Active (05/09/2022)   Exercise Vital Sign    Days of Exercise per Week: 7 days    Minutes of Exercise per Session: 20 min  Stress: Not on file  Social Connections: Not on file  Intimate Partner Violence: Not At Risk (07/17/2022)   Humiliation, Afraid, Rape, and Kick questionnaire    Fear of Current or Ex-Partner: No    Emotionally Abused: No    Physically Abused: No    Sexually Abused: No    Family History:   History reviewed. No pertinent family history.   ROS:  Please see the history of present illness.   All other ROS reviewed and negative.     Physical Exam/Data:   Vitals:   07/17/22 1100 07/17/22 1500 07/17/22 1531 07/17/22 1535  BP: 126/82     Pulse: 65     Resp: 18     Temp: 98.1 F (36.7 C)     TempSrc: Oral     SpO2: 99% 99% 93% 94%  Weight:      Height:        Intake/Output Summary (Last 24 hours) at 07/17/2022 1726 Last data filed at 07/17/2022 1245 Gross per 24 hour  Intake 790 ml  Output  1800 ml  Net -1010 ml      07/17/2022    4:43 AM 06/18/2022   10:18 AM 05/09/2022   11:59 AM  Last 3 Weights  Weight (lbs) 167 lb 12.3 oz 158 lb 11.2 oz 160 lb  Weight (kg) 76.1 kg 71.986 kg 72.576 kg     Body mass  index is 33.87 kg/m.  General:  Well nourished, well developed, in no acute distress HEENT: normal Neck: no JVD Vascular: No carotid bruits; Distal pulses 2+ bilaterally Cardiac:  normal S1, S2; RRR; no murmur  Lungs: diminished breath sounds in bilateral bases. Abd: soft, nontender, no hepatomegaly  Ext: no edema Musculoskeletal:  No deformities, BUE and BLE strength normal and equal Skin: warm and dry  Neuro:  CNs 2-12 intact, no focal abnormalities noted Psych:  Normal affect   EKG:  The EKG was personally reviewed and demonstrates: Normal sinus rhythm, minimal ST depression with T wave inversion in the inferolateral leads.  This is unchanged when compared to the previous EKG from December 2023. Telemetry:  Telemetry was personally reviewed and demonstrates: Normal sinus rhythm, no significant ventricular ectopy or arrhythmia  Relevant CV Studies:  Echo 12/13/2021 1. Left ventricular ejection fraction, by estimation, is 70 to 75%. Left  ventricular ejection fraction by 3D volume is 71 %. The left ventricle has  hyperdynamic function. The left ventricle has no regional wall motion  abnormalities. There is mild left  ventricular hypertrophy. Left ventricular diastolic parameters are  consistent with Grade I diastolic dysfunction (impaired relaxation). The  average left ventricular global longitudinal strain is -24.4 %. The global  longitudinal strain is normal.   2. Right ventricular systolic function is normal. The right ventricular  size is normal.   3. Left atrial size was moderately dilated.   4. The mitral valve is abnormal. Mild mitral valve regurgitation.  Moderate to severe mitral annular calcification.   5. The aortic valve is tricuspid. Aortic valve regurgitation is not  visualized.   6. The inferior vena cava is normal in size with greater than 50%  respiratory variability, suggesting right atrial pressure of 3 mmHg.   Comparison(s): Changes from prior study are  noted. 01/07/2020: LVEF 55-60%,  grade 2 DD.   Laboratory Data:  High Sensitivity Troponin:  No results for input(s): "TROPONINIHS" in the last 720 hours.   Chemistry Recent Labs  Lab 07/16/22 1755 07/17/22 0731  NA 138 139  K 4.3 3.9  CL 103 104  CO2 27 26  GLUCOSE 154* 138*  BUN 18 14  CREATININE 0.80 0.89  CALCIUM 9.9 9.4  GFRNONAA >60 >60  ANIONGAP 8 9    Recent Labs  Lab 07/16/22 1755  PROT 7.3  ALBUMIN 4.2  AST 12*  ALT 15  ALKPHOS 55  BILITOT 0.6   Lipids No results for input(s): "CHOL", "TRIG", "HDL", "LABVLDL", "LDLCALC", "CHOLHDL" in the last 168 hours.  Hematology Recent Labs  Lab 07/16/22 1755 07/17/22 0731  WBC 8.4 8.4  RBC 4.02 3.96  HGB 10.9* 10.5*  HCT 33.6* 33.3*  MCV 83.6 84.1  MCH 27.1 26.5  MCHC 32.4 31.5  RDW 13.2 13.0  PLT 232 278   Thyroid No results for input(s): "TSH", "FREET4" in the last 168 hours.  BNP Recent Labs  Lab 07/16/22 1755  BNP 513.6*    DDimer No results for input(s): "DDIMER" in the last 168 hours.   Radiology/Studies:  CT Angio Chest PE W/Cm &/Or Wo Cm  Result Date: 07/16/2022 CLINICAL  DATA:  Pulmonary embolism (PE) suspected, high prob. EXAM: CT ANGIOGRAPHY CHEST WITH CONTRAST TECHNIQUE: Multidetector CT imaging of the chest was performed using the standard protocol during bolus administration of intravenous contrast. Multiplanar CT image reconstructions and MIPs were obtained to evaluate the vascular anatomy. RADIATION DOSE REDUCTION: This exam was performed according to the departmental dose-optimization program which includes automated exposure control, adjustment of the mA and/or kV according to patient size and/or use of iterative reconstruction technique. CONTRAST:  55m OMNIPAQUE IOHEXOL 350 MG/ML SOLN COMPARISON:  None Available. FINDINGS: Cardiovascular: Contrast injection is sufficient to demonstrate satisfactory opacification of the pulmonary arteries to the segmental level. There is no pulmonary embolus  or evidence of right heart strain. The size of the main pulmonary artery is normal. There are coronary artery calcifications and calcific aortic atherosclerosis. No pericardial effusion. Mediastinum/Nodes: No mediastinal, hilar or axillary lymphadenopathy. Normal visualized thyroid. Thoracic esophageal course is normal. Lungs/Pleura: Bilateral parahilar ground-glass opacities, likely pulmonary edema. Small pleural effusions. Upper Abdomen: Contrast bolus timing is not optimized for evaluation of the abdominal organs. The visualized portions of the organs of the upper abdomen are normal. Musculoskeletal: No chest wall abnormality. No bony spinal canal stenosis. Review of the MIP images confirms the above findings. IMPRESSION: 1. No pulmonary embolus. 2. Bilateral parahilar ground-glass opacities, likely pulmonary edema. 3. Small pleural effusions. Aortic Atherosclerosis (ICD10-I70.0). Electronically Signed   By: KUlyses JarredM.D.   On: 07/16/2022 20:07   DG Chest 2 View  Result Date: 07/16/2022 CLINICAL DATA:  Shortness of breath EXAM: CHEST - 2 VIEW COMPARISON:  None Available. FINDINGS: Small bilateral pleural effusions. Borderline cardiac enlargement with vascular congestion. Heterogeneous left greater than right airspace disease. Aortic atherosclerosis. No pneumothorax. IMPRESSION: Small bilateral pleural effusions. Heterogeneous left greater than right airspace disease, suspicious for bilateral pneumonia. Slightly nodular appearance of consolidative opacities on the left, imaging follow-up to resolution is recommended. Electronically Signed   By: KDonavan FoilM.D.   On: 07/16/2022 18:39     Assessment and Plan:   Pulmonary edema  -Although chest x-ray suggestive of possible bilateral pneumonia, subsequent CTA was negative for PE, however does have bilateral pulmonary edema with small pleural effusion.  Patient has been placed on IV Lasix, 20 mg yesterday and 40 mg twice daily today.  She has put out  1 L of fluid, her breathing has significantly improved.  Will continue with IV Lasix.  She has persistent diminished breath sounds in bilateral bases consistent with small pleural effusion.  Pending echocardiogram.   CAD s/p stenting in 2015: Denies any recent chest pain.  Carotid artery disease: Recent carotid ultrasound in January 2024 demonstrated moderate disease on the right side, mild disease on the left side.  Hypertension: Continue home med.  Blood pressure was elevated yesterday on arrival, however after diuresis, it has normalized.  Hyperlipidemia: On Lipitor  DM2  Obstructive sleep apnea: Has not started on CPAP therapy.  Need to start on CPAP therapy   Risk Assessment/Risk Scores:       New York Heart Association (NYHA) Functional Class NYHA Class III     For questions or updates, please contact [place]ys LakePlease consult [email or web address] for contact info under    SHilbert Corrigan PUtah 07/17/2022 5:26 PM  History and all data above reviewed.  Patient examined.  I agree with the findings as above.  The patient presents with increased shortness of breath that she says is about 4 days.  She lives by herself  and does her own cooking.  She says she does not add salt.  She does not wear self frequently.  She noticed a little bit of lower extremity swelling.  She had a little dizziness on standing.  Her presenting complaint however was shortness of breath.  This happened when she would try to talk her walk across the room.  She was not describing any chest pressure, neck or arm discomfort.  She was not having any palpitations, presyncope or syncope.  She presents with some edema on chest x-ray and elevated BNP.  Echocardiography is pending the patient exam reveals COR: Regular rate and rhythm, no murmurs,  Lungs: Decreased breath sounds bilateral bases,  Abd: Positive bowel sounds no bruits, rebound, or guarding, Ext 2+ pulses, no edema.  Neuro: Chronic left facial droop.   All available labs, radiology testing, previous records reviewed. Agree with documented assessment and plan.  Acute heart failure: Still suspect this is diastolic.  She had hyperdynamic LV previously.  She can continue with current IV diuresis.  Will clarify her meds.  For now I would continue with the meds as on the [facility].  This does not include currently hydrochlorothiazide or spironolactone.  We might resume these based on blood pressure readings overnight.  I talked to her about salt and weights.  I do not suspect acute coronary event.  I do not suspect that she will need further inpatient testing.  Translator app utilized.  Carolyn Little  5:46 PM  07/17/2022

## 2022-07-17 NOTE — ED Notes (Signed)
[facility] at bedside for transport. 

## 2022-07-17 NOTE — H&P (Signed)
History and Physical    Patient: Carolyn Little [id number] DOB: [birth year] DOA: 07/16/2022 DOS: the patient was seen and examined on 07/17/2022 PCP: Benito Mccreedy, MD  Patient coming from: Home  Chief Complaint:  Chief Complaint  Patient presents with   Shortness of Breath   HPI: Carolyn Little is a 77 y.o. female with medical history significant of hypertension, hyperlipidemia, diabetes mellitus type 2, seizure disorder, and obesity who presents from home with complaints of shortness of breath.  History is obtained with the patient's daughter acting as a interpreter.  About 1 week ago patient had reported having headache with chills and bodyaches.  Those symptoms resolved and over the last  patient had reported progressively worsening shortness of breath and lower extremity swelling.  Patient had woken up in the middle of the night yesterday morning to use the bathroom reported acute worsening of her shortness of breath.  Reported associated  palpitations.  Denies having any chest pain, cough, fever, abdominal pain, nausea, vomiting, diarrhea, or blood in stools..    Patient had been seen by Dr. Oval Linsey on 06/18/2022 and her dose of amlodipine have been increased to 5 mg twice daily.  Family wondered if the lower extremity swelling was related to recent medication changes and had notified cardiology.  They had sent a message to Dr. [name] who advised patient to cut amlodipine back down to 5 mg daily and started the patient on spironolactone on 2/19.  Patient had only been able to take 1 dose  In the emergency department patient was noted to be afebrile with tachypnea and blood pressures elevated up to 178/63, and O2 saturations as low as 88% with improvement on 5 L nasal cannula oxygen that was able to be weaned down to 3 L.  Respiratory virus panel negative for influenza, RSV, and COVID-19. Labs from 2/20 significant for hemoglobin 10.9 and BNP 513.6. CT angiogram of the chest noted no  pulmonary embolism, bilateral perihilar groundglass opacities thought likely pulmonary edema with small pleural effusions.    Patient has been given Rocephin, azithromycin, Lasix 20 mg IV, and morphine 4 mg IV x 1 dose.  Review of Systems: As mentioned in the history of present illness. All other systems reviewed and are negative. Past Medical History:  Diagnosis Date   Arthritis    Dizziness    DM (diabetes mellitus) ([place])    Generalized headaches    Hearing loss    High cholesterol    Hypertension    Low back pain    Obesity    OSA (obstructive sleep apnea) 06/18/2022   Renal insufficiency    Seizure disorder [facility])    Past Surgical History:  Procedure Laterality Date   BACK SURGERY     BRAIN TUMOR EXCISION  1984   HAND SURGERY     LAPAROSCOPIC HYSTERECTOMY     SHOULDER SURGERY     Social History:  reports that she has never smoked. She has never used smokeless tobacco. She reports that she does not currently use alcohol. She reports that she does not use drugs.  Allergies  Allergen Reactions   Levaquin [Levofloxacin] Hives    History reviewed. No pertinent family history.  Prior to Admission medications   Medication Sig Start Date End Date Taking? Authorizing Provider  ACCU-CHEK GUIDE test strip as directed as directed 01/30/21   [provider]  amLODipine (NORVASC) 5 MG tablet Take 1 tablet (5 mg total) by mouth daily. 07/15/22   Jerline Pain, MD  atorvastatin (LIPITOR) 40 MG tablet Take 1 tablet (40 mg total) by mouth daily. 01/10/22   Jerline Pain, MD  Blood Glucose Monitoring Suppl (ACCU-CHEK GUIDE) w/Device KIT  02/11/20   [provider]  Blood Glucose Monitoring Suppl (GLUCOCOM BLOOD GLUCOSE MONITOR) DEVI 1 Device by [place].(Non-Drug; Combo Route) route as needed (to check blood glucose). 12/22/20   [provider]  Blood Glucose Monitoring Suppl ([place]) w/Device KIT 1 DEVICE BY MISC.(NON-DRUG COMBO ROUTE) ROUTE AS NEEDED  (TO CHECK BLOOD GLUCOSE). 12/22/20   [provider]  carvedilol (COREG) 25 MG tablet Take 0.5 tablets (12.5 mg total) by mouth 2 (two) times daily with a meal. 03/25/22   Jerline Pain, MD  celecoxib (CELEBREX) 100 MG capsule Take 100 mg by mouth daily. 03/26/22   [provider]  Continuous Blood Gluc Receiver (DEXCOM G6 RECEIVER) DEVI Use as directed for continuous glucose monitoring. 03/17/20   [provider]  Continuous Blood Gluc Sensor (DEXCOM G6 SENSOR) MISC Inject 1 sensor to the skin every 10 days for continuous glucose monitoring. 10/05/20   [provider]  Continuous Blood Gluc Transmit (DEXCOM G6 TRANSMITTER) MISC Use as directed for continuous glucose monitoring. Reuse transmitter for 90 days then discard and replace. 10/05/20   [provider]  [facility] ASPIRIN ADULT LOW DOSE 81 MG chewable tablet Chew 81 mg by mouth daily. 01/07/20   [provider]  diclofenac Sodium (VOLTAREN) 1 % GEL SMARTSIG:Gram(s) Topical 4 Times Daily 01/28/20   [provider]  erythromycin ophthalmic ointment SMARTSIG:1 [place] Left Eye Every Evening 01/23/21   [provider]  famotidine (PEPCID) 40 MG tablet Take 40 mg by mouth daily.    [provider]  glucose blood (ONETOUCH VERIO) test strip To monitor sugar 4 times per day 12/22/20   [provider]  glucose blood (PRECISION QID TEST) test strip To monitor sugar 4 times per day 03/17/20   [provider]  insulin glargine (LANTUS) 100 UNIT/ML injection Inject 35 Units into the skin daily.    [provider]  Insulin Pen Needle (BD PEN NEEDLE NANO U/F) 32G X 4 MM MISC Use once daily for [place] and once weekly for [place] 123456   [provider]  lamoTRIgine (LAMICTAL) 150 MG tablet TAKE 1 TABLET BY MOUTH TWICE A DAY 04/15/22   Penumalli, Earlean Polka, MD  Lancets (ONETOUCH DELICA PLUS Q000111Q) MISC 1 DEVICE BY MISC.(NON-DRUG COMBO ROUTE) ROUTE  4 TIMES DAILY. 03/07/21   [provider]  levETIRAcetam (KEPPRA) 500 MG tablet Take 1 tablet (500 mg total) by mouth daily. 10/03/21   Penumalli, Earlean Polka, MD  meclizine (ANTIVERT) 25 MG tablet Take 25 mg by mouth 3 (three) times daily as needed for dizziness.    [provider]  metFORMIN (GLUCOPHAGE) 500 MG tablet Take 500 mg by mouth 2 (two) times daily. 12/06/19   [provider]  methocarbamol (ROBAXIN) 500 MG tablet Take 500 mg by mouth at bedtime as needed. 03/26/22   [provider]  [facility] VERIO test strip 4 (four) times daily. 03/01/21   [provider]  SIMBRINZA 1-0.2 % SUSP Apply 1 drop to eye 2 (two) times daily. 05/23/22   [provider]  spironolactone (ALDACTONE) 25 MG tablet Take 0.5 tablets (12.5 mg total) by mouth daily. 07/15/22   Jerline Pain, MD  TOBRADEX ophthalmic ointment SMARTSIG:1 sparingly In Eye(s) Every Night 02/15/21   [provider]  TRULICITY 1.5 0000000  SOPN Inject into the skin. 30 units weekly 06/25/21   [provider]  Vitamin D, Ergocalciferol, (DRISDOL) 1.25 MG (50000 UNIT) CAPS capsule Take 50,000 Units by mouth once a week. 01/03/20   [provider]    Physical Exam: Vitals:   07/17/22 0330 07/17/22 0424 07/17/22 0443 07/17/22 0600  BP: (!) 156/68  (!) 152/65   Pulse: 69  69   Resp: 20  20 18  $ Temp:  (!) 97.4 F (36.3 C) (!) 97.4 F (36.3 C)   TempSrc:  Oral Oral   SpO2: 100%  96%   Weight:   76.1 kg   Height:   4' 11.02" (1.499 m)     Constitutional: Elderly female who appears to be in no acute distress at this time Eyes: PERRL, lids and conjunctivae normal ENMT: Mucous membranes are moist.  Normal dentition.  Neck: normal, supple  Respiratory: Normal respiratory effort without significant wheezes or rhonchi appreciated.  Patient on 3 L nasal cannula oxygen with O2 saturations maintained Cardiovascular: Regular rate and rhythm, no murmurs / rubs / gallops.  1+  pitting edema.. 2+ pedal pulses.    Abdomen: no tenderness, no masses palpated. Bowel sounds positive.  Musculoskeletal: no clubbing / cyanosis. No joint deformity upper and lower extremities. Good ROM, no contractures. Normal muscle tone.  Skin: no rashes, lesions, ulcers. No induration Neurologic: CN 2-12 grossly intact. Strength 5/5 in all 4.  Psychiatric: Normal judgment and insight. Alert and oriented x 3. Normal mood.   Data Reviewed:  EKG revealed normal sinus rhythm at 74 bpm otherwise similar to prior.  Reviewed labs, imaging, and pertinent records as noted above in HPI.  Assessment and Plan: Acute respiratory failure with hypoxia secondary to diastolic congestive heart failure exacerbation Patient was noted to be hypoxic down to 88% on room air and currently O2 saturation maintained on 3 L nasal cannula oxygen.  CT angiogram noted no pulmonary embolism, bilateral perihilar groundglass opacities thought likely pulmonary edema, and small pleural effusion.  Concern of last echocardiogram noted EF to be 70 to 75% with grade 1 diastolic dysfunction back in 11/2021.  Patient has been given Lasix 20 mg IV x 1 dose. -Admitted to progressive bed -Heart failure order set utilized -Continuous pulse oximetry with nasal cannula oxygen to maintain O2 saturation greater than 92%.  Wean to room air as tolerated -Strict I&O's and daily weights -Check procalcitonin -Lasix 40 mg IV twice daily -Check echocardiogram -Continue beta-blocker and pharmacy substitution for Diovan -Cardiology consulted, will follow-up for any further recommendations  Possible community-acquired pneumonia Patient is afebrile with no white blood cell count.  She had initially been given empiric antibiotics of Rocephin and azithromycin due to to concern for pneumonia.  CTA of the chest makes this seem less likely. -Follow-up procalcitonin(noted to be<0.1 for which antibiotic not recommended to be continued)  Normocytic  anemia Acute.  Hemoglobin 10.9 with low MCV and MCH.  Baseline hemoglobin previously had been within normal limits. -Recheck CBC(hemoglobin 10.5 which appears similar to prior)  Uncontrolled Diabetes mellitus type 2, with long-term use of insulin On admission glucose 154.  Last available hemoglobin A1c 7.9 on 06/25/2022 in care everywhere records.  At home patient utilizing Lantus 20 units daily. -Hypoglycemia protocols -CBGs before every meal with moderate SSI -Continue to monitor and adjust as needed  Essential hypertension Home medication regimen includes Coreg 12.5 mg twice daily, amlodipine 5 mg daily, Diovan HCT 80-12.5 mg, and spironolactone 12.5 mg daily. -Continued Coreg, amlodipine, pharmacy  substitution Diovan  Hyperlipidemia -Continue atorvastatin  Seizure disorder -Continue Keppra and Lamictal  DVT prophylaxis: Lovenox Advance Care Planning:   Code Status: Full Code   Consults: Cardiology  Family Communication: Daughter updated at bedside  Severity of Illness: The appropriate patient status for this patient is INPATIENT. Inpatient status is judged to be reasonable and necessary in order to provide the required intensity of service to ensure the patient's safety. The patient's presenting symptoms, physical exam findings, and initial radiographic and laboratory data in the context of their chronic comorbidities is felt to place them at high risk for further clinical deterioration. Furthermore, it is not anticipated that the patient will be medically stable for discharge from the hospital within 2 midnights of admission.   * I certify that at the point of admission it is my clinical judgment that the patient will require inpatient hospital care spanning beyond 2 midnights from the point of admission due to high intensity of service, high risk for further deterioration and high frequency of surveillance required.*  Author: Norval Morton, MD 07/17/2022 7:18 AM  For on call  review [email or web address].

## 2022-07-17 NOTE — TOC Progression Note (Addendum)
Transition of Care [facility]) - Progression Note    Patient Details  Name: Carolyn Little MRN: [id number] Date of Birth: 22-Apr-1946  Transition of [facility]) CM/SW Contact  Zenon Mayo, RN Phone Number: 07/17/2022, 10:26 AM  Clinical Narrative:    from home alone per daughter, CHF ex, conts on iv lasix, she  is ambulatory. She does not have a scale but she does check her bp, she tries to eat a low sodium diet.   TOC following.         Expected Discharge Plan and Services                                               Social Determinants of Health (SDOH) Interventions SDOH Screenings   Food Insecurity: No Food Insecurity (07/17/2022)  Housing: Low Risk  (07/17/2022)  Transportation Needs: No Transportation Needs (07/17/2022)  Utilities: Not At Risk (07/17/2022)  Alcohol Screen: Low Risk  (05/09/2022)  Physical Activity: Insufficiently Active (05/09/2022)  Tobacco Use: Low Risk  (07/16/2022)    Readmission Risk Interventions     No data to display

## 2022-07-17 NOTE — ED Notes (Signed)
Report given to Maree Erie, RN at [facility] for [phone].

## 2022-07-18 ENCOUNTER — Other Ambulatory Visit (HOSPITAL_COMMUNITY): Payer: Self-pay

## 2022-07-18 ENCOUNTER — Inpatient Hospital Stay (HOSPITAL_COMMUNITY): Payer: 59

## 2022-07-18 ENCOUNTER — Encounter: Payer: Self-pay | Admitting: Internal Medicine

## 2022-07-18 DIAGNOSIS — E669 Obesity, unspecified: Secondary | ICD-10-CM | POA: Diagnosis present

## 2022-07-18 DIAGNOSIS — I5031 Acute diastolic (congestive) heart failure: Secondary | ICD-10-CM

## 2022-07-18 DIAGNOSIS — I5033 Acute on chronic diastolic (congestive) heart failure: Secondary | ICD-10-CM

## 2022-07-18 DIAGNOSIS — E1169 Type 2 diabetes mellitus with other specified complication: Secondary | ICD-10-CM | POA: Diagnosis present

## 2022-07-18 DIAGNOSIS — G4733 Obstructive sleep apnea (adult) (pediatric): Secondary | ICD-10-CM

## 2022-07-18 LAB — CBC
HCT: 30.1 % — ABNORMAL LOW (ref 36.0–46.0)
Hemoglobin: 9.6 g/dL — ABNORMAL LOW (ref 12.0–15.0)
MCH: 26.5 pg (ref 26.0–34.0)
MCHC: 31.9 g/dL (ref 30.0–36.0)
MCV: 83.1 fL (ref 80.0–100.0)
Platelets: 266 10*3/uL (ref 150–400)
RBC: 3.62 MIL/uL — ABNORMAL LOW (ref 3.87–5.11)
RDW: 12.9 % (ref 11.5–15.5)
WBC: 6.5 10*3/uL (ref 4.0–10.5)
nRBC: 0 % (ref 0.0–0.2)

## 2022-07-18 LAB — BASIC METABOLIC PANEL
Anion gap: 10 (ref 5–15)
BUN: 15 mg/dL (ref 8–23)
CO2: 27 mmol/L (ref 22–32)
Calcium: 8.6 mg/dL — ABNORMAL LOW (ref 8.9–10.3)
Chloride: 99 mmol/L (ref 98–111)
Creatinine, Ser: 1.1 mg/dL — ABNORMAL HIGH (ref 0.44–1.00)
GFR, Estimated: 52 mL/min — ABNORMAL LOW (ref >60)
Glucose, Bld: 245 mg/dL — ABNORMAL HIGH (ref 70–99)
Potassium: 4 mmol/L (ref 3.5–5.1)
Sodium: 136 mmol/L (ref 135–145)

## 2022-07-18 LAB — GLUCOSE, CAPILLARY
Glucose-Capillary: 150 mg/dL — ABNORMAL HIGH (ref 70–99)
Glucose-Capillary: 231 mg/dL — ABNORMAL HIGH (ref 70–99)

## 2022-07-18 LAB — ECHOCARDIOGRAM COMPLETE
AR max vel: 2.25 cm2
AV Area VTI: 2.26 cm2
AV Area mean vel: 2.24 cm2
AV Mean grad: 5 mmHg
AV Peak grad: 9.6 mmHg
Ao pk vel: 1.55 m/s
Area-P 1/2: 4.49 cm2
Est EF: 65
Height: 59.016 in
S' Lateral: 2.8 cm
Weight: 2546.75 oz

## 2022-07-18 MED ORDER — SPIRONOLACTONE 25 MG PO TABS
25.0000 mg | ORAL_TABLET | Freq: Every day | ORAL | 0 refills | Status: DC
  Filled 2022-07-18: qty 30, 30d supply, fill #0

## 2022-07-18 MED ORDER — EMPAGLIFLOZIN 10 MG PO TABS: 10.0000 mg | ORAL_TABLET | Freq: Every day | ORAL | Status: DC

## 2022-07-18 MED ORDER — EMPAGLIFLOZIN 10 MG PO TABS
10.0000 mg | ORAL_TABLET | Freq: Every day | ORAL | 0 refills | Status: DC
  Filled 2022-07-18: qty 30, 30d supply, fill #0

## 2022-07-18 MED ORDER — POLYETHYLENE GLYCOL 3350 17 G PO PACK
17.0000 g | PACK | Freq: Every day | ORAL | Status: DC
  Administered 2022-07-18: 17 g via ORAL
  Filled 2022-07-18: qty 1

## 2022-07-18 MED ORDER — FUROSEMIDE 20 MG PO TABS: 20.0000 mg | ORAL_TABLET | Freq: Every day | ORAL | Status: DC

## 2022-07-18 MED ORDER — FUROSEMIDE 20 MG PO TABS
20.0000 mg | ORAL_TABLET | Freq: Every day | ORAL | 0 refills | Status: DC
  Filled 2022-07-18: qty 30, 30d supply, fill #0

## 2022-07-18 MED ORDER — IRBESARTAN 75 MG PO TABS
75.0000 mg | ORAL_TABLET | Freq: Every day | ORAL | 0 refills | Status: DC
  Filled 2022-07-18: qty 30, 30d supply, fill #0

## 2022-07-18 NOTE — Progress Notes (Signed)
Heart Failure Navigator Progress Note  Assessed for Heart & Vascular TOC clinic readiness.  ECHO results pending at this time.   Of note, pt has close cardiology follow up scheduled for 3/7 @ 11AM with Dr. Oval Linsey. No HV TOC appts available until 3/12.   HF Navigation team will sign-off. Please re-consult if needs arise.     Pricilla Holm, MSN, RN Heart Failure Nurse Navigator

## 2022-07-18 NOTE — Assessment & Plan Note (Signed)
No active seizures, continue with keppra, and lamotrigine.

## 2022-07-18 NOTE — Assessment & Plan Note (Addendum)
Uncontrolled diabetes mellitus with hyperglycemia.  Fasting glucose is 245, plan to continue diabetic regimen with dulagitide and metformin.   Continue with statin therapy.

## 2022-07-18 NOTE — Hospital Course (Addendum)
Carolyn Little was admitted to the hospital with the working diagnosis of decompensated heart failure.   77 yo female with the past medical history of hypertension, dyslipidemia, T2DM, and seizures who presented with dyspnea. Patient developed worsening dyspnea on exertion, and lower extremity edema. As outpatient dose of amlodipine was decreased and she was started on spironolactone 02/19. Because persistent symptoms she came to the ED. On her initial physical examination her blood pressure was 178/63, HR 69, RR 20 and 02 saturation 96%, lungs with no wheezing or rhonchi, heart with S1 and S2 present and regular, abdomen with no distention and positive lower extremity edema.  Na 138, K 4,3 Cl 103, bicarbonate 27, glucose 154, bun 18 cr 0,80  BNP 513, Wbc 8,4 hgb 10,9 plt 232  Sars covid 19 negative   Chest radiograph with cardiomegaly, bilateral hilar vascular congestion, with right pleural effusion. CT chest with bilateral ground glass opacities, with bilateral pleural effusions, more right than left.  No pulmonary embolism.   EKG 74 bpm, normal axis, normal intervals, sinus rhythm with no significant ST segment changes, negative T wave V5 and V6.   Patient was placed on furosemide with improvement in her symptoms.  Plan to follow up as outpatient.

## 2022-07-18 NOTE — Progress Notes (Signed)
Echocardiogram 2D Echocardiogram has been performed.  Carolyn Little 07/18/2022, 9:34 AM

## 2022-07-18 NOTE — Assessment & Plan Note (Addendum)
Echocardiogram with preserved LV systolic function EF 123456, mild LVH, RV systolic function preserved. LA with mild dilatation, with no significant valvular disease.   Patient was placed on furosemide IV for diuresis, negative fluid balance was achieved with significant improvement in her symptoms.  She loss 4 kg during her hospitalization.   Systolic blood pressure [id number] to 140 mmHg.   Plan to continue heart failure management with irbersartan, spironolactone and empagliflozin.  Hold on amlodipine Continue carvedilol.

## 2022-07-18 NOTE — Assessment & Plan Note (Addendum)
Her blood pressure has improved. Continue irbersartan and diuresis with furosemide.  Spironolactone and carvedilol.

## 2022-07-18 NOTE — Plan of Care (Signed)

## 2022-07-18 NOTE — Assessment & Plan Note (Signed)
Patient will follow up with sleep clinica, she had sleep study already and will get home cpap.

## 2022-07-18 NOTE — Discharge Summary (Signed)
Physician Discharge Summary   Patient: Carolyn Little MRN: [id number] DOB: [birth year]  Admit date:     07/16/2022  Discharge date: 07/18/22  Discharge Physician: Jimmy Picket Carolyn Little   PCP: Benito Mccreedy, MD   Recommendations at discharge:    Patient has been placed on furosemide 20 mg daily. Added Empagliflozin and continue with spironolactone at 25 mg po daily Resume ARB and continue with carvedilol.  Follow up renal function and electrolytes in 7 days Follow up with Dr Vista Lawman in 7 to 10 days Follow up with Cardiology as scheduled.   Discharge Diagnoses: Principal Problem:   Acute on chronic diastolic CHF (congestive heart failure) (HCC) Active Problems:   Hypertension   Type 2 diabetes mellitus with hyperlipidemia (HCC)   Normocytic anemia   Seizure disorder (HCC)   OSA (obstructive sleep apnea)   Class 1 obesity  Resolved Problems:   * No resolved hospital problems. [facility] Course: Carolyn Little was admitted to the hospital with the working diagnosis of decompensated heart failure.   77 yo female with the past medical history of hypertension, dyslipidemia, T2DM, and seizures who presented with dyspnea. Patient developed worsening dyspnea on exertion, and lower extremity edema. As outpatient dose of amlodipine was decreased and she was started on spironolactone 02/19. Because persistent symptoms she came to the ED. On her initial physical examination her blood pressure was 178/63, HR 69, RR 20 and 02 saturation 96%, lungs with no wheezing or rhonchi, heart with S1 and S2 present and regular, abdomen with no distention and positive lower extremity edema.  Na 138, K 4,3 Cl 103, bicarbonate 27, glucose 154, bun 18 cr 0,80  BNP 513, Wbc 8,4 hgb 10,9 plt 232  Sars covid 19 negative   Chest radiograph with cardiomegaly, bilateral hilar vascular congestion, with right pleural effusion. CT chest with bilateral ground glass opacities, with bilateral pleural effusions,  more right than left.  No pulmonary embolism.   EKG 74 bpm, normal axis, normal intervals, sinus rhythm with no significant ST segment changes, negative T wave V5 and V6.   Patient was placed on furosemide with improvement in her symptoms.  Plan to follow up as outpatient.   Assessment and Plan: * Acute on chronic diastolic CHF (congestive heart failure) (HCC) Echocardiogram with preserved LV systolic function EF [id number], mild LVH, RV systolic function preserved. LA with mild dilatation, with no significant valvular disease.   Patient was placed on furosemide IV for diuresis, negative fluid balance was achieved with significant improvement in her symptoms.  She loss 4 kg during her hospitalization.   Systolic blood pressure [id number] to 140 mmHg.   Plan to continue heart failure management with irbersartan, spironolactone and empagliflozin.  Hold on amlodipine Continue carvedilol.   Hypertension Her blood pressure has improved. Continue irbersartan and diuresis with furosemide.  Spironolactone and carvedilol.   Type 2 diabetes mellitus with hyperlipidemia (HCC) Uncontrolled diabetes mellitus with hyperglycemia.  Fasting glucose is 245, plan to continue diabetic regimen with dulagitide and metformin.   Continue with statin therapy.   Normocytic anemia Anemia of chronic disease, follow up cell count as outpatient.   Seizure disorder ([place]) No active seizures, continue with keppra, and lamotrigine.   OSA (obstructive sleep apnea) Patient will follow up with sleep clinica, she had sleep study already and will get home cpap.   Class 1 obesity Calculated BMI is 32.1          Consultants: cardiology  Procedures performed: none   Disposition: Home Diet  recommendation:  Cardiac and Carb modified diet DISCHARGE MEDICATION: Allergies as of 07/18/2022       Reactions   Levaquin [levofloxacin] Hives        Medication List     STOP taking these medications    amLODipine 5  MG tablet Commonly known as: NORVASC   celecoxib 100 MG capsule Commonly known as: CELEBREX   Diovan HCT 80-12.5 MG tablet Generic drug: valsartan-hydrochlorothiazide   ibuprofen 200 MG tablet Commonly known as: ADVIL       TAKE these medications    OneTouch Verio test strip Generic drug: glucose blood To monitor sugar 4 times per day   Accu-Chek Guide test strip Generic drug: glucose blood as directed as directed   OneTouch Verio test strip Generic drug: glucose blood 4 (four) times daily.   Accu-Chek Guide w/Device Kit   OneTouch Verio Flex System w/Device Kit 1 DEVICE BY MISC.(NON-DRUG COMBO ROUTE) ROUTE AS NEEDED (TO CHECK BLOOD GLUCOSE).   GlucoCom Blood Glucose Monitor Devi 1 Device by [employer].(Non-Drug; Combo Route) route as needed (to check blood glucose).   acetaminophen 325 MG tablet Commonly known as: TYLENOL Take 325-650 mg by mouth every 6 (six) hours as needed.   atorvastatin 40 MG tablet Commonly known as: LIPITOR Take 1 tablet (40 mg total) by mouth daily.   BD Pen Needle Nano U/F 32G X 4 MM Misc Generic drug: Insulin Pen Needle Use once daily for Tresiba and once weekly for Trulicity   carvedilol 25 MG tablet Commonly known as: COREG Take 25 mg by mouth 2 (two) times daily with a meal.   [employer] Aspirin Adult Low Dose 81 MG chewable tablet Generic drug: aspirin Chew 81 mg by mouth daily.   Dexcom G6 Receiver Devi Use as directed for continuous glucose monitoring.   Dexcom G6 Sensor Misc Inject 1 sensor to the skin every 10 days for continuous glucose monitoring.   Dexcom G6 Transmitter Misc Use as directed for continuous glucose monitoring. Reuse transmitter for 90 days then discard and replace.   diclofenac Sodium 1 % Gel Commonly known as: VOLTAREN Apply 2 g topically 4 (four) times daily as needed.   empagliflozin 10 MG Tabs tablet Commonly known as: JARDIANCE Take 1 tablet (10 mg total) by mouth daily.   famotidine 40 MG  tablet Commonly known as: PEPCID Take 40 mg by mouth daily.   furosemide 20 MG tablet Commonly known as: LASIX Take 1 tablet (20 mg total) by mouth daily. Start taking on: July 19, 2022   irbesartan 75 MG tablet Commonly known as: AVAPRO Take 1 tablet (75 mg total) by mouth daily. Start taking on: July 19, 2022   lamoTRIgine 150 MG tablet Commonly known as: LAMICTAL TAKE 1 TABLET BY MOUTH TWICE A DAY   levETIRAcetam 500 MG tablet Commonly known as: KEPPRA Take 1 tablet (500 mg total) by mouth daily.   metFORMIN 500 MG tablet Commonly known as: GLUCOPHAGE Take 500 mg by mouth 2 (two) times daily.   OneTouch Delica Plus 123456 Misc 1 DEVICE BY MISC.(NON-DRUG COMBO ROUTE) ROUTE 4 TIMES DAILY.   Simbrinza 1-0.2 % Susp Generic drug: Brinzolamide-Brimonidine Apply 1 drop to eye 2 (two) times daily.   spironolactone 25 MG tablet Commonly known as: ALDACTONE Take 1 tablet (25 mg total) by mouth daily. What changed: how much to take   Tresiba 100 UNIT/ML Soln Generic drug: Insulin Degludec Inject 20 Units into the skin every evening.   Trulicity 3 0000000 Sopn Generic drug: Dulaglutide  Inject 3 mg into the skin every 7 (seven) days.        Follow-up Information     Benito Mccreedy, MD. Go on 07/23/2022.   Specialty: Internal Medicine Why: @2$ :15pm Contact information: [id number] [place] [address] [phone]                Discharge Exam: Filed Weights   07/17/22 0443 07/18/22 0030 07/18/22 0401  Weight: 76.1 kg 76.1 kg 72.2 kg   BP 137/66 (BP Location: Right Arm)   Pulse 72   Temp 98 F (36.7 C) (Oral)   Resp 16   Ht 4' 11.02" (1.499 m)   Wt 72.2 kg   SpO2 96%   BMI 32.13 kg/m   Patient is feeling better, she has been ambulating with no dyspnea and has no longer lower extremity edema  Neurology awake and alert ENT with no pallor Cardiovascular with S1 and S2 present and rhythmic with no gallops, rubs or  murmurs Respiratory with no rales or wheezing Abdomen with no distention  No lower extremity edema   Condition at discharge: stable  The results of significant diagnostics from this hospitalization (including imaging, microbiology, ancillary and laboratory) are listed below for reference.   Imaging Studies: ECHOCARDIOGRAM COMPLETE  Result Date: 07/18/2022    ECHOCARDIOGRAM REPORT   Patient Name:   [facility] Date of Exam: 07/18/2022 Medical Rec #:  [id number]    Height:       59.0 in Accession #:    [id number]   Weight:       159.2 lb Date of Birth:  08/30/1945   BSA:          1.674 m Patient Age:    54 years     BP:           140/54 mmHg Patient Gender: F            HR:           63 bpm. Exam Location:  Inpatient Procedure: 2D Echo, Cardiac Doppler and Color Doppler Indications:    CHF- Acute Diastolic [id number]  History:        Patient has no prior history of Echocardiogram examinations.                 CHF, CAD; Risk Factors:Diabetes, Hypertension, Sleep Apnea and                 Dyslipidemia.  Sonographer:    Ronny Flurry Referring Phys: [id number] RONDELL A SMITH IMPRESSIONS  1. Left ventricular ejection fraction, by estimation, is 65%. The left ventricle has normal function. The left ventricle has no regional wall motion abnormalities. There is mild left ventricular hypertrophy. Left ventricular diastolic parameters are indeterminate.  2. Right ventricular systolic function is normal. The right ventricular size is normal. Tricuspid regurgitation signal is inadequate for assessing PA pressure.  3. Left atrial size was mildly dilated.  4. The mitral valve is degenerative. Trivial mitral valve regurgitation. No evidence of mitral stenosis. Moderate to severe mitral annular calcification.  5. The aortic valve is grossly normal. There is mild calcification of the aortic valve. Aortic valve regurgitation is not visualized. Aortic valve sclerosis/calcification is present, without any evidence of aortic  stenosis.  6. The inferior vena cava is dilated in size with >50% respiratory variability, suggesting right atrial pressure of 8 mmHg. FINDINGS  Left Ventricle: Left ventricular ejection fraction, by estimation, is 65%. The left ventricle has normal function. The  left ventricle has no regional wall motion abnormalities. 3D left ventricular ejection fraction analysis performed but not reported based on interpreter judgement due to suboptimal tracking. The left ventricular internal cavity size was normal in size. There is mild left ventricular hypertrophy. Left ventricular diastolic function could not be evaluated due to mitral annular calcification (moderate or greater). Left ventricular diastolic parameters are indeterminate. Right Ventricle: The right ventricular size is normal. No increase in right ventricular wall thickness. Right ventricular systolic function is normal. Tricuspid regurgitation signal is inadequate for assessing PA pressure. Left Atrium: Left atrial size was mildly dilated. Right Atrium: Right atrial size was normal in size. Pericardium: Trivial pericardial effusion is present. Mitral Valve: The mitral valve is degenerative in appearance. Moderate to severe mitral annular calcification. Trivial mitral valve regurgitation. No evidence of mitral valve stenosis. The mean mitral valve gradient is 3.0 mmHg with average heart rate of  68 bpm. Tricuspid Valve: The tricuspid valve is normal in structure. Tricuspid valve regurgitation is trivial. No evidence of tricuspid stenosis. Aortic Valve: The aortic valve is grossly normal. There is mild calcification of the aortic valve. Aortic valve regurgitation is not visualized. Aortic valve sclerosis/calcification is present, without any evidence of aortic stenosis. Aortic valve mean gradient measures 5.0 mmHg. Aortic valve peak gradient measures 9.6 mmHg. Aortic valve area, by VTI measures 2.26 cm. Pulmonic Valve: The pulmonic valve was normal in structure.  Pulmonic valve regurgitation is not visualized. No evidence of pulmonic stenosis. Aorta: The aortic root is normal in size and structure. Venous: The inferior vena cava is dilated in size with greater than 50% respiratory variability, suggesting right atrial pressure of 8 mmHg. IAS/Shunts: The atrial septum is grossly normal.  LEFT VENTRICLE PLAX 2D LVIDd:         3.80 cm   Diastology LVIDs:         2.80 cm   LV e' medial:    7.93 cm/s LV PW:         1.30 cm   LV E/e' medial:  18.9 LV IVS:        1.10 cm   LV e' lateral:   8.92 cm/s LVOT diam:     1.90 cm   LV E/e' lateral: 16.8 LV SV:         75 LV SV Index:   45 LVOT Area:     2.84 cm                           3D Volume EF:                          LV EDV:       124 ml                          LV ESV:       48 ml                          LV SV:        76 ml RIGHT VENTRICLE             IVC RV S prime:     13.10 cm/s  IVC diam: 2.10 cm TAPSE (M-mode): 1.9 cm LEFT ATRIUM             Index  RIGHT ATRIUM           Index LA diam:        3.80 cm 2.27 cm/m   RA Area:     17.50 cm LA Vol (A2C):   52.0 ml 31.07 ml/m  RA Volume:   45.50 ml  27.18 ml/m LA Vol (A4C):   47.7 ml 28.50 ml/m LA Biplane Vol: 50.0 ml 29.87 ml/m  AORTIC VALVE AV Area (Vmax):    2.25 cm AV Area (Vmean):   2.24 cm AV Area (VTI):     2.26 cm AV Vmax:           155.00 cm/s AV Vmean:          104.000 cm/s AV VTI:            0.333 m AV Peak Grad:      9.6 mmHg AV Mean Grad:      5.0 mmHg LVOT Vmax:         123.00 cm/s LVOT Vmean:        82.200 cm/s LVOT VTI:          0.266 m LVOT/AV VTI ratio: 0.80  AORTA Ao Root diam: 3.30 cm Ao Asc diam:  3.00 cm MITRAL VALVE MV Area (PHT): 4.49 cm     SHUNTS MV Mean grad:  3.0 mmHg     Systemic VTI:  0.27 m MV Decel Time: 169 msec     Systemic Diam: 1.90 cm MV E velocity: 150.00 cm/s MV A velocity: 92.20 cm/s MV E/A ratio:  1.63 Cherlynn Kaiser MD Electronically signed by Cherlynn Kaiser MD Signature Date/Time: 07/18/2022/10:33:38 AM    Final    CT  Angio Chest PE W/Cm &/Or Wo Cm  Result Date: 07/16/2022 CLINICAL DATA:  Pulmonary embolism (PE) suspected, high prob. EXAM: CT ANGIOGRAPHY CHEST WITH CONTRAST TECHNIQUE: Multidetector CT imaging of the chest was performed using the standard protocol during bolus administration of intravenous contrast. Multiplanar CT image reconstructions and MIPs were obtained to evaluate the vascular anatomy. RADIATION DOSE REDUCTION: This exam was performed according to the departmental dose-optimization program which includes automated exposure control, adjustment of the mA and/or kV according to patient size and/or use of iterative reconstruction technique. CONTRAST:  60m OMNIPAQUE IOHEXOL 350 MG/ML SOLN COMPARISON:  None Available. FINDINGS: Cardiovascular: Contrast injection is sufficient to demonstrate satisfactory opacification of the pulmonary arteries to the segmental level. There is no pulmonary embolus or evidence of right heart strain. The size of the main pulmonary artery is normal. There are coronary artery calcifications and calcific aortic atherosclerosis. No pericardial effusion. Mediastinum/Nodes: No mediastinal, hilar or axillary lymphadenopathy. Normal visualized thyroid. Thoracic esophageal course is normal. Lungs/Pleura: Bilateral parahilar ground-glass opacities, likely pulmonary edema. Small pleural effusions. Upper Abdomen: Contrast bolus timing is not optimized for evaluation of the abdominal organs. The visualized portions of the organs of the upper abdomen are normal. Musculoskeletal: No chest wall abnormality. No bony spinal canal stenosis. Review of the MIP images confirms the above findings. IMPRESSION: 1. No pulmonary embolus. 2. Bilateral parahilar ground-glass opacities, likely pulmonary edema. 3. Small pleural effusions. Aortic Atherosclerosis (ICD10-I70.0). Electronically Signed   By: KUlyses JarredM.D.   On: 07/16/2022 20:07   DG Chest 2 View  Result Date: 07/16/2022 CLINICAL DATA:   Shortness of breath EXAM: CHEST - 2 VIEW COMPARISON:  None Available. FINDINGS: Small bilateral pleural effusions. Borderline cardiac enlargement with vascular congestion. Heterogeneous left greater than right airspace disease. Aortic atherosclerosis. No pneumothorax. IMPRESSION: Small bilateral  pleural effusions. Heterogeneous left greater than right airspace disease, suspicious for bilateral pneumonia. Slightly nodular appearance of consolidative opacities on the left, imaging follow-up to resolution is recommended. Electronically Signed   By: Donavan Foil M.D.   On: 07/16/2022 18:39    Microbiology: Results for orders placed or performed during the hospital encounter of 07/16/22  Resp panel by RT-PCR (RSV, Flu A&B, Covid) Nasopharyngeal Swab     Status: None   Collection Time: 07/16/22  5:55 PM   Specimen: Nasopharyngeal Swab; Nasal Swab  Result Value Ref Range Status   SARS Coronavirus 2 by RT PCR NEGATIVE NEGATIVE Final    Comment: (NOTE) SARS-CoV-2 target nucleic acids are NOT DETECTED.  The SARS-CoV-2 RNA is generally detectable in upper respiratory specimens during the acute phase of infection. The lowest concentration of SARS-CoV-2 viral copies this assay can detect is 138 copies/mL. A negative result does not preclude SARS-Cov-2 infection and should not be used as the sole basis for treatment or other patient management decisions. A negative result may occur with  improper specimen collection/handling, submission of specimen other than nasopharyngeal swab, presence of viral mutation(s) within the areas targeted by this assay, and inadequate number of viral copies(<138 copies/mL). A negative result must be combined with clinical observations, patient history, and epidemiological information. The expected result is Negative.  Fact Sheet for Patients:  EntrepreneurPulse.com.au  Fact Sheet for Healthcare Providers:   IncredibleEmployment.be  This test is no t yet approved or cleared by the [place] FDA and  has been authorized for detection and/or diagnosis of SARS-CoV-2 by FDA under an Emergency Use Authorization (EUA). This EUA will remain  in effect (meaning this test can be used) for the duration of the COVID-19 declaration under Section 564(b)(1) of the Act, 21 U.S.C.section 360bbb-3(b)(1), unless the authorization is terminated  or revoked sooner.       Influenza A by PCR NEGATIVE NEGATIVE Final   Influenza B by PCR NEGATIVE NEGATIVE Final    Comment: (NOTE) The Xpert Xpress SARS-CoV-2/FLU/RSV plus assay is intended as an aid in the diagnosis of influenza from Nasopharyngeal swab specimens and should not be used as a sole basis for treatment. Nasal washings and aspirates are unacceptable for Xpert Xpress SARS-CoV-2/FLU/RSV testing.  Fact Sheet for Patients: EntrepreneurPulse.com.au  Fact Sheet for Healthcare Providers: IncredibleEmployment.be  This test is not yet approved or cleared by the [place] FDA and has been authorized for detection and/or diagnosis of SARS-CoV-2 by FDA under an Emergency Use Authorization (EUA). This EUA will remain in effect (meaning this test can be used) for the duration of the COVID-19 declaration under Section 564(b)(1) of the Act, 21 U.S.C. section 360bbb-3(b)(1), unless the authorization is terminated or revoked.     Resp Syncytial Virus by PCR NEGATIVE NEGATIVE Final    Comment: (NOTE) Fact Sheet for Patients: EntrepreneurPulse.com.au  Fact Sheet for Healthcare Providers: IncredibleEmployment.be  This test is not yet approved or cleared by the [place] FDA and has been authorized for detection and/or diagnosis of SARS-CoV-2 by FDA under an Emergency Use Authorization (EUA). This EUA will remain in effect (meaning this test can be used) for  the duration of the COVID-19 declaration under Section 564(b)(1) of the Act, 21 U.S.C. section 360bbb-3(b)(1), unless the authorization is terminated or revoked.  Performed at [facility], [address] [place], [place] [address]     Labs: CBC: Recent Labs  Lab 07/16/22 1755 07/17/22 0731 07/18/22 0054  WBC 8.4 8.4 6.5  HGB  10.9* 10.5* 9.6*  HCT 33.6* 33.3* 30.1*  MCV 83.6 84.1 83.1  PLT 232 278 [id number]   Basic Metabolic Panel: Recent Labs  Lab 07/16/22 1755 07/17/22 0731 07/18/22 0054  NA 138 139 136  K 4.3 3.9 4.0  CL 103 104 99  CO2 27 26 27  $ GLUCOSE 154* 138* 245*  BUN 18 14 15  $ CREATININE 0.80 0.89 1.10*  CALCIUM 9.9 9.4 8.6*   Liver Function Tests: Recent Labs  Lab 07/16/22 1755  AST 12*  ALT 15  ALKPHOS 55  BILITOT 0.6  PROT 7.3  ALBUMIN 4.2   CBG: Recent Labs  Lab 07/17/22 1128 07/17/22 1600 07/17/22 2104 07/18/22 0627 07/18/22 1149  GLUCAP 219* 114* 280* 150* 231*    Discharge time spent: greater than 30 minutes.  Signed: Tawni Millers, MD [facility] 07/18/2022

## 2022-07-18 NOTE — Progress Notes (Signed)
Progress Note  Patient Name: Carolyn Little Date of Encounter: 07/18/2022  Primary Cardiologist:   [name], MD   Subjective   Breathing better and things that it is at baseline.   Inpatient Medications    Scheduled Meds:  amLODipine  5 mg Oral Daily   aspirin  81 mg Oral Daily   atorvastatin  40 mg Oral Daily   brinzolamide  1 drop Both Eyes BID   And   brimonidine  1 drop Both Eyes BID   carvedilol  25 mg Oral BID WC   famotidine  40 mg Oral Daily   furosemide  40 mg Intravenous BID   insulin aspart  0-15 Units Subcutaneous TID WC   insulin aspart  0-5 Units Subcutaneous QHS   irbesartan  75 mg Oral Daily   lamoTRIgine  150 mg Oral BID   levETIRAcetam  500 mg Oral Daily   sodium chloride flush  3 mL Intravenous Q12H   Continuous Infusions:  PRN Meds: acetaminophen **OR** acetaminophen, albuterol   Vital Signs    Vitals:   07/17/22 1600 07/17/22 1935 07/18/22 0030 07/18/22 0401  BP:  (!) 128/50 (!) 126/44 (!) 140/54  Pulse:   64 65  Resp:    16  Temp:  98 F (36.7 C) 98.1 F (36.7 C) (!) 97.5 F (36.4 C)  TempSrc:  Oral Oral Oral  SpO2: 92% 91% 100% 99%  Weight:   76.1 kg 72.2 kg  Height:        Intake/Output Summary (Last 24 hours) at 07/18/2022 0829 Last data filed at 07/17/2022 2111 Gross per 24 hour  Intake 677 ml  Output 1600 ml  Net -923 ml   Filed Weights   07/17/22 0443 07/18/22 0030 07/18/22 0401  Weight: 76.1 kg 76.1 kg 72.2 kg    Telemetry    NSR - Personally Reviewed  ECG    NA - Personally Reviewed  Physical Exam   GEN: No acute distress.   Neck: No  JVD Cardiac: RRR, no murmurs, rubs, or gallops.  Respiratory: Clear  to auscultation bilaterally. GI: Soft, nontender, non-distended  MS: No  edema; No deformity. Neuro:  Nonfocal  Psych: Normal affect   Labs    Chemistry Recent Labs  Lab 07/16/22 1755 07/17/22 0731 07/18/22 0054  NA 138 139 136  K 4.3 3.9 4.0  CL 103 104 99  CO2 27 26 27  $ GLUCOSE 154* 138*  245*  BUN 18 14 15  $ CREATININE 0.80 0.89 1.10*  CALCIUM 9.9 9.4 8.6*  PROT 7.3  --   --   ALBUMIN 4.2  --   --   AST 12*  --   --   ALT 15  --   --   ALKPHOS 55  --   --   BILITOT 0.6  --   --   GFRNONAA >60 >60 52*  ANIONGAP 8 9 10     $ Hematology Recent Labs  Lab 07/16/22 1755 07/17/22 0731 07/18/22 0054  WBC 8.4 8.4 6.5  RBC 4.02 3.96 3.62*  HGB 10.9* 10.5* 9.6*  HCT 33.6* 33.3* 30.1*  MCV 83.6 84.1 83.1  MCH 27.1 26.5 26.5  MCHC 32.4 31.5 31.9  RDW 13.2 13.0 12.9  PLT 232 278 266    Cardiac EnzymesNo results for input(s): "TROPONINI" in the last 168 hours. No results for input(s): "TROPIPOC" in the last 168 hours.   BNP Recent Labs  Lab 07/16/22 1755  BNP 513.6*  DDimer No results for input(s): "DDIMER" in the last 168 hours.   Radiology    CT Angio Chest PE W/Cm &/Or Wo Cm  Result Date: 07/16/2022 CLINICAL DATA:  Pulmonary embolism (PE) suspected, high prob. EXAM: CT ANGIOGRAPHY CHEST WITH CONTRAST TECHNIQUE: Multidetector CT imaging of the chest was performed using the standard protocol during bolus administration of intravenous contrast. Multiplanar CT image reconstructions and MIPs were obtained to evaluate the vascular anatomy. RADIATION DOSE REDUCTION: This exam was performed according to the departmental dose-optimization program which includes automated exposure control, adjustment of the mA and/or kV according to patient size and/or use of iterative reconstruction technique. CONTRAST:  80m OMNIPAQUE IOHEXOL 350 MG/ML SOLN COMPARISON:  None Available. FINDINGS: Cardiovascular: Contrast injection is sufficient to demonstrate satisfactory opacification of the pulmonary arteries to the segmental level. There is no pulmonary embolus or evidence of right heart strain. The size of the main pulmonary artery is normal. There are coronary artery calcifications and calcific aortic atherosclerosis. No pericardial effusion. Mediastinum/Nodes: No mediastinal, hilar or  axillary lymphadenopathy. Normal visualized thyroid. Thoracic esophageal course is normal. Lungs/Pleura: Bilateral parahilar ground-glass opacities, likely pulmonary edema. Small pleural effusions. Upper Abdomen: Contrast bolus timing is not optimized for evaluation of the abdominal organs. The visualized portions of the organs of the upper abdomen are normal. Musculoskeletal: No chest wall abnormality. No bony spinal canal stenosis. Review of the MIP images confirms the above findings. IMPRESSION: 1. No pulmonary embolus. 2. Bilateral parahilar ground-glass opacities, likely pulmonary edema. 3. Small pleural effusions. Aortic Atherosclerosis (ICD10-I70.0). Electronically Signed   By: KUlyses JarredM.D.   On: 07/16/2022 20:07   DG Chest 2 View  Result Date: 07/16/2022 CLINICAL DATA:  Shortness of breath EXAM: CHEST - 2 VIEW COMPARISON:  None Available. FINDINGS: Small bilateral pleural effusions. Borderline cardiac enlargement with vascular congestion. Heterogeneous left greater than right airspace disease. Aortic atherosclerosis. No pneumothorax. IMPRESSION: Small bilateral pleural effusions. Heterogeneous left greater than right airspace disease, suspicious for bilateral pneumonia. Slightly nodular appearance of consolidative opacities on the left, imaging follow-up to resolution is recommended. Electronically Signed   By: KDonavan FoilM.D.   On: 07/16/2022 18:39    Cardiac Studies   Echo   1. Left ventricular ejection fraction, by estimation, is 65%. The left  ventricle has normal function. The left ventricle has no regional wall  motion abnormalities. There is mild left ventricular hypertrophy. Left  ventricular diastolic parameters are  indeterminate.   2. Right ventricular systolic function is normal. The right ventricular  size is normal. Tricuspid regurgitation signal is inadequate for assessing  PA pressure.   3. Left atrial size was mildly dilated.   4. The mitral valve is degenerative.  Trivial mitral valve regurgitation.  No evidence of mitral stenosis. Moderate to severe mitral annular  calcification.   5. The aortic valve is grossly normal. There is mild calcification of the  aortic valve. Aortic valve regurgitation is not visualized. Aortic valve  sclerosis/calcification is present, without any evidence of aortic  stenosis.   6. The inferior vena cava is dilated in size with >50% respiratory  variability, suggesting right atrial pressure of 8 mmHg.    Patient Profile     77y.o. female  with a hx of CAD s/p stenting in 2015, carotid artery disease, HTN, HLD, DM2, and obstructive sleep apnea not on CPAP therapy who is being seen 07/17/2022 for the evaluation of pulmonary edema at the request of Dr. STamala Julian   Assessment & Plan  Pulmonary edema:  Good response clinically to diuresis.   Echo with normal EF as above.  OK to change to PO Lasix and likely needs a low dose at home of 20 mg.  Educated (used interpreter app again today.)  Needs daily weights.   CAD s/p stenting in 2015: I do not suspect acute coronary syndrome.  No further work up.      Carotid artery disease: Recent carotid ultrasound in January 2024 demonstrated moderate disease on the right side, mild disease on the left side.  This should be followed in one year and we can defer to Dr. Marlou Porch.    Hypertension: Continue home med.  Blood pressure was elevated yesterday on arrival, however after diuresis, it has normalized.   Hyperlipidemia:   Continue Lipitor   DM2:  Per primary team.   Anemia:  Per primary team.    Obstructive sleep apnea: Has not started on CPAP therapy.  Need to start on CPAP therapy.  She has been seen by our clinic.  I sent our clinic a message to see where she is in terms of getting the mask delivered.     For questions or updates, please contact [place] Please consult [email or web address] for contact info under Cardiology/STEMI.   Signed, Minus Breeding, MD  07/18/2022, 8:29  AM

## 2022-07-18 NOTE — TOC Transition Note (Signed)
Transition of Care [facility]) - CM/SW Discharge Note   Patient Details  Name: Carolyn Little MRN: [id number] Date of Birth: 04-02-1946  [facility] CM/SW Contact:  Zenon Mayo, RN Phone Number: 07/18/2022, 1:19 PM   Clinical Narrative:    For dc today, has no needs.         Patient Goals and CMS Choice      Discharge Placement                         Discharge Plan and Services Additional resources added to the After Visit Summary for                                       Social Determinants of Health (SDOH) Interventions SDOH Screenings   Food Insecurity: No Food Insecurity (07/17/2022)  Housing: Low Risk  (07/17/2022)  Transportation Needs: No Transportation Needs (07/17/2022)  Utilities: Not At Risk (07/17/2022)  Alcohol Screen: Low Risk  (05/09/2022)  Physical Activity: Insufficiently Active (05/09/2022)  Tobacco Use: Low Risk  (07/16/2022)     Readmission Risk Interventions     No data to display

## 2022-07-18 NOTE — Assessment & Plan Note (Signed)
Calculated BMI is 32.1

## 2022-07-18 NOTE — Assessment & Plan Note (Signed)
Anemia of chronic disease, follow up cell count as outpatient.

## 2022-07-26 DIAGNOSIS — R011 Cardiac murmur, unspecified: Secondary | ICD-10-CM | POA: Diagnosis not present

## 2022-07-26 DIAGNOSIS — K219 Gastro-esophageal reflux disease without esophagitis: Secondary | ICD-10-CM | POA: Diagnosis not present

## 2022-07-26 DIAGNOSIS — M8589 Other specified disorders of bone density and structure, multiple sites: Secondary | ICD-10-CM | POA: Diagnosis not present

## 2022-07-26 DIAGNOSIS — I10 Essential (primary) hypertension: Secondary | ICD-10-CM | POA: Diagnosis not present

## 2022-07-26 DIAGNOSIS — E1165 Type 2 diabetes mellitus with hyperglycemia: Secondary | ICD-10-CM | POA: Diagnosis not present

## 2022-07-26 DIAGNOSIS — Z794 Long term (current) use of insulin: Secondary | ICD-10-CM | POA: Diagnosis not present

## 2022-07-26 DIAGNOSIS — E782 Mixed hyperlipidemia: Secondary | ICD-10-CM | POA: Diagnosis not present

## 2022-07-26 DIAGNOSIS — J309 Allergic rhinitis, unspecified: Secondary | ICD-10-CM | POA: Diagnosis not present

## 2022-07-26 DIAGNOSIS — E559 Vitamin D deficiency, unspecified: Secondary | ICD-10-CM | POA: Diagnosis not present

## 2022-07-26 DIAGNOSIS — M1990 Unspecified osteoarthritis, unspecified site: Secondary | ICD-10-CM | POA: Diagnosis not present

## 2022-07-29 ENCOUNTER — Ambulatory Visit: Payer: 59 | Admitting: Nurse Practitioner

## 2022-07-29 NOTE — Progress Notes (Incomplete)
Advanced Hypertension Clinic Follow-up:    Date:  07/29/2022   ID:  Carolyn Little, DOB [birth year], MRN [id number]  PCP:  Benito Mccreedy, MD  Cardiologist:  [name], MD  Nephrologist:  Referring MD: Benito Mccreedy, MD   CC: Hypertension  History of Present Illness:    Carolyn Little is a 77 y.o. female with a hx of hypertension, hyperlipidemia, diabetes, and CAD, here for follow-up. She was initially seen 05/09/2022 by Laurann Montana, NP in the [facility]. She has previously worked with Dr. Marlou Porch and self-discontinued valsartan/HCTZ and amlodipine. She was seen in the ED 09/2021 with hypertension and headaches. Echo 12/13/21 LVEF 70-75%. Renal duplex 12/20/21 negative for renal artery stenosis. She has been working with our pharmacist as well and has had labile blood pressures. She was noted to have sleep apnea but not adherent to CPAP. [name] increased her amlodipine to 2.5 mg twice daily. She repeated her sleep study which confirmed moderate sleep apnea and CPAP was recommended. She had carotid dopplers that revealed moderate stenosis on the right and mild on the left.   Today, she is accompanied by a professional interpreter and a family member. She states she is feeling fine. Yesterday she felt mildly dizzy, but had checked her BP prior to her symptom. At home her BP has been labile, ranging 130s-150s mostly; sometimes as high as 158 and as low as [id number] systolic. Generally she notices lower blood pressures first thing in the morning, and increased readings later in the day. Typically she does not complete much exercise. Previously she was more active. She is limited by left hip pain and prior back surgery. She also complains of left shoulder pain, and lower back pain. She does follow with orthopedics, and will participate in physical therapy. For pain management she would usually take Advil as it has been more effective than tylenol. She confirms experiencing  intermittent LLE edema inferior to the knee. She would relate this to her left knee pain. Lately her breathing has been stable. She denies orthopnea at night. Additionally she complains of constantly feeling cold in her bilateral feet, even with wearing socks. She also feels associated pinching/tingling sensations. This has been ongoing for 2-3 months. She denies any palpitations, chest pain, headaches, syncope, or PND.  Previous antihypertensives: Valsartan-HCTZ  Past Medical History:  Diagnosis Date   Arthritis    Dizziness    DM (diabetes mellitus) (HCC)    Generalized headaches    Hearing loss    High cholesterol    Hypertension    Low back pain    Obesity    OSA (obstructive sleep apnea) 06/18/2022   Renal insufficiency    Seizure disorder ([place])     Past Surgical History:  Procedure Laterality Date   BACK SURGERY     BRAIN TUMOR EXCISION  1984   HAND SURGERY     LAPAROSCOPIC HYSTERECTOMY     SHOULDER SURGERY      Current Medications: No outpatient medications have been marked as taking for the 08/01/22 encounter (Appointment) with Skeet Latch, MD.     Allergies:   Levaquin [levofloxacin]   Social History   Socioeconomic History   Marital status: Widowed    Spouse name: Not on file   Number of children: 4   Years of education: Not on file   Highest education level: Not on file  Occupational History   Not on file  Tobacco Use   Smoking status: Never   Smokeless tobacco: Never  Vaping Use   Vaping Use: Never used  Substance and Sexual Activity   Alcohol use: Not Currently   Drug use: Never   Sexual activity: Not on file  Other Topics Concern   Not on file  Social History Narrative   Lives alone   Social Determinants of Health   Financial Resource Strain: Not on file  Food Insecurity: No Food Insecurity (07/17/2022)   Hunger Vital Sign    Worried About Running Out of Food in the Last Year: Never true    Ran Out of Food in the Last Year: Never true   Transportation Needs: No Transportation Needs (07/17/2022)   PRAPARE - Hydrologist (Medical): No    Lack of Transportation (Non-Medical): No  Physical Activity: Insufficiently Active (05/09/2022)   Exercise Vital Sign    Days of Exercise per Week: 7 days    Minutes of Exercise per Session: 20 min  Stress: Not on file  Social Connections: Not on file     Family History: The patient's family history is not on file.  ROS:   Please see the history of present illness.    (+) Mild dizziness (+) Left shoulder pain (+) Left knee pain (+) Lower back pain (+) Intermittent LLE edema inferior to the knee (+) Coldness, tingling sensations of bilateral feet All other systems reviewed and are negative.  EKGs/Labs/Other Studies Reviewed:    Bilateral Carotid Dopplers  06/05/2022: Summary:  Right Carotid: Velocities in the right ICA are consistent with a 40-59%                 stenosis. The ECA appears >50% stenosed.   Left Carotid: Velocities in the left ICA are consistent with a 1-39%  stenosis.               The ECA appears >50% stenosed.   Vertebrals:  Bilateral vertebral arteries demonstrate antegrade flow.  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 Normal flow  hemodynamics were seen in the left subclavian artery.   Renal Artery Dopplers  7/27/223 Summary:  Renal:    Right: No evidence of right renal artery stenosis. Abnormal size for         the right kidney. Normal cortical thickness of right kidney.         Normal right Resisitive Index.  Left:  No evidence of left renal artery stenosis. LRV flow present.         Normal size of left kidney. Normal left Resistive Index.         Normal cortical thickness of the left kidney.  Mesenteric:  Normal Celiac artery and Superior Mesenteric artery findings. Areas of  limited  visceral study include right renal artery, right kidney size and right  parenchymal flow.   Echo  12/13/2021:  1. Left  ventricular ejection fraction, by estimation, is 70 to 75%. Left  ventricular ejection fraction by 3D volume is 71 %. The left ventricle has  hyperdynamic function. The left ventricle has no regional wall motion  abnormalities. There is mild left  ventricular hypertrophy. Left ventricular diastolic parameters are  consistent with Grade I diastolic dysfunction (impaired relaxation). The  average left ventricular global longitudinal strain is -24.4 %. The global  longitudinal strain is normal.   2. Right ventricular systolic function is normal. The right ventricular  size is normal.   3. Left atrial size was moderately dilated.   4. The mitral valve is abnormal. Mild mitral valve regurgitation.  Moderate to severe mitral annular calcification.   5. The aortic valve is tricuspid. Aortic valve regurgitation is not  visualized.   6. The inferior vena cava is normal in size with greater than 50%  respiratory variability, suggesting right atrial pressure of 3 mmHg.   Comparison(s): Changes from prior study are noted. 01/07/2020: LVEF 55-60%,  grade 2 DD.   Nuclear Stress Myoview  01/07/2020: The left ventricular ejection fraction is normal (55-65%). Nuclear stress EF: 59%. There was no ST segment deviation noted during stress. The study is normal. This is a low risk study.   Normal resting and stress perfusion. No ischemia or infarction EF 59%  EKG:  EKG is personally reviewed. 06/18/2022: EKG was not ordered.  Recent Labs: 07/16/2022: ALT 15; B Natriuretic Peptide 513.6 07/18/2022: BUN 15; Creatinine, Ser 1.10; Hemoglobin 9.6; Platelets 266; Potassium 4.0; Sodium 136   Recent Lipid Panel No results found for: "CHOL", "TRIG", "HDL", "CHOLHDL", "VLDL", "LDLCALC", "LDLDIRECT"  Physical Exam:    VS:  There were no vitals taken for this visit. , BMI There is no height or weight on file to calculate BMI. GENERAL:  Well appearing HEENT: Pupils equal round and reactive, fundi not  visualized, oral mucosa unremarkable NECK:  No jugular venous distention, waveform within normal limits, carotid upstroke brisk and symmetric, no bruits, no thyromegaly LUNGS:  Clear to auscultation bilaterally HEART:  RRR.  PMI not displaced or sustained,S1 and S2 within normal limits, no S3, no S4, no clicks, no rubs, II/VI systolic murmur at the LUSB ABD:  Flat, positive bowel sounds normal in frequency in pitch, no bruits, no rebound, no guarding, no midline pulsatile mass, no hepatomegaly, no splenomegaly EXT:  2 plus pulses throughout, no edema, no cyanosis, no clubbing SKIN:  No rashes, no nodules NEURO:  Cranial nerves II through XII grossly intact, motor grossly intact throughout PSYCH:  Cognitively intact, oriented to person place and time   ASSESSMENT/PLAN:    No problem-specific Assessment & Plan notes found for this encounter.   Screening for Secondary Hypertension:     05/09/2022    4:46 PM  Causes  Sleep Apnea Screened  Thyroid Disease Screened  Pheochromocytoma Screened  Cushing's Syndrome Screened  Coarctation of the Aorta Screened    Relevant Labs/Studies:    Latest Ref Rng & Units 07/18/2022   12:54 AM 07/17/2022    7:31 AM 07/16/2022    5:55 PM  Basic Labs  Sodium 135 - 145 mmol/L 136  139  138   Potassium 3.5 - 5.1 mmol/L 4.0  3.9  4.3   Creatinine 0.44 - 1.00 mg/dL 1.10  0.89  0.80              Latest Ref Rng & Units 05/15/2022   10:54 AM  Metanephrines/Catecholamines   Epinephrine 0 - 62 pg/mL 41   Norepinephrine 0 - 874 pg/mL 1,091   Dopamine 0 - 48 pg/mL 32   Metanephrines 0.0 - 88.0 pg/mL 45.0   Normetanephrines  0.0 - 285.2 pg/mL 130.0           12/20/2021   10:26 AM  Renovascular   Renal Artery [place] Completed Yes    Disposition:    FU with Tiffany C. Oval Linsey, MD, [facility] in 1-2 months.  Medication Adjustments/Labs and Tests Ordered: Current medicines are reviewed at length with the patient today.  Concerns regarding medicines are  outlined above.   No orders of the defined types were placed in this encounter.  No orders  of the defined types were placed in this encounter.  I,Mathew Stumpf,acting as a Education administrator for Skeet Latch, MD.,have documented all relevant documentation on the behalf of Skeet Latch, MD,as directed by  Skeet Latch, MD while in the presence of Skeet Latch, MD.  I, [place] Oval Linsey, MD have reviewed all documentation for this visit.  The documentation of the exam, diagnosis, procedures, and orders on 07/29/2022 are all accurate and complete.   Waynetta Pean  07/29/2022 4:36 PM    [facility]

## 2022-08-01 ENCOUNTER — Ambulatory Visit (HOSPITAL_BASED_OUTPATIENT_CLINIC_OR_DEPARTMENT_OTHER): Payer: 59 | Admitting: Cardiovascular Disease

## 2022-08-01 ENCOUNTER — Telehealth (HOSPITAL_BASED_OUTPATIENT_CLINIC_OR_DEPARTMENT_OTHER): Payer: Self-pay

## 2022-08-01 NOTE — Telephone Encounter (Signed)
Called patient to reschedule appointment, pt. Rescheduled

## 2022-08-06 DIAGNOSIS — G4733 Obstructive sleep apnea (adult) (pediatric): Secondary | ICD-10-CM | POA: Diagnosis not present

## 2022-08-12 ENCOUNTER — Telehealth: Payer: Self-pay | Admitting: Cardiology

## 2022-08-12 MED ORDER — SPIRONOLACTONE 25 MG PO TABS: 25.0000 mg | ORAL_TABLET | Freq: Every day | ORAL | 10 refills | Status: DC

## 2022-08-12 MED ORDER — EMPAGLIFLOZIN 10 MG PO TABS: 10.0000 mg | ORAL_TABLET | Freq: Every day | ORAL | 10 refills | Status: DC

## 2022-08-12 MED ORDER — FUROSEMIDE 20 MG PO TABS: 20.0000 mg | ORAL_TABLET | Freq: Every day | ORAL | 10 refills | Status: DC

## 2022-08-12 MED ORDER — IRBESARTAN 75 MG PO TABS: 75.0000 mg | ORAL_TABLET | Freq: Every day | ORAL | 10 refills | Status: DC

## 2022-08-12 NOTE — Telephone Encounter (Signed)
*  STAT* If patient is at the pharmacy, call can be transferred to refill team.   1. Which medications need to be refilled? (please list name of each medication and dose if known)  empagliflozin (JARDIANCE) 10 MG TABS tablet  irbesartan (AVAPRO) 75 MG tablet  spironolactone (ALDACTONE) 25 MG tablet  furosemide (LASIX) 20 MG tablet   2. Which pharmacy/location (including street and city if local pharmacy) is medication to be sent to? [id number] - [place], [place] - [address] AT [address]   3. Do they need a 30 day or 90 day supply? 30 day  Patient only has enough for tomorrow.

## 2022-08-12 NOTE — Telephone Encounter (Signed)
Pt's medications were sent to pt's pharmacy as requested. Confirmation received.  

## 2022-09-04 ENCOUNTER — Ambulatory Visit (HOSPITAL_BASED_OUTPATIENT_CLINIC_OR_DEPARTMENT_OTHER): Payer: 59 | Admitting: Cardiovascular Disease

## 2022-09-04 NOTE — Progress Notes (Deleted)
Advanced Hypertension Clinic Follow-up:    Date:  09/04/2022   ID:  Carolyn Little, DOB [birth year], MRN [id number]  PCP:  Jackie Plum, MD  Cardiologist:  Donato Schultz, MD  Nephrologist:  Referring MD: Jackie Plum, MD   CC: Hypertension  History of Present Illness:    Carolyn Little is a 77 y.o. female with a hx of hypertension, hyperlipidemia, carotid stenosis, CAD, diabetes, and CAD, here for follow-up. She was initially seen 05/09/2022 by [name], NP in the [facility]. She has previously worked with Dr. [name] and self-discontinued valsartan/HCTZ and amlodipine. She was seen in the ED 09/2021 with hypertension and headaches. Echo 12/13/21 LVEF 70-75%. Renal duplex 12/20/21 negative for renal artery stenosis. She has been working with our pharmacist as well and has had labile blood pressures. She was noted to have sleep apnea but not adherent to CPAP. [name] increased her amlodipine to 2.5 mg twice daily. She repeated her sleep study which confirmed moderate sleep apnea and CPAP was recommended. She had carotid dopplers that revealed moderate stenosis on the right and mild on the left.   At her visit 05/2022 home blood pressures were ranging in the 130s to 150s.  She had not been getting much exercise due to left hip pain and back pain.  We recommended increasing her pain control and increase amlodipine to 5 mg twice daily.  She noted swelling on higher doses of amlodipine.  She was admitted 06/2022 with shortness of breath thought to be due to acute diastolic heart failure.  Blood pressure normalized and respiratory status improved with diuresis.  Echo that admission revealed LVEF 65% with mild LVH and indeterminate diastolic function.  Right ventricular function was normal.  In the hospital amlodipine was discontinued and she was continued on irbesartan, spironolactone, carvedilol, and Jardiance.  Of note, blood pressure was in the 120s over 40s to 50s on this  regimen in the hospital.    Previous antihypertensives: Valsartan-HCTZ Amlodipine-10 mg caused swelling  Past Medical History:  Diagnosis Date   Arthritis    Dizziness    DM (diabetes mellitus) (HCC)    Generalized headaches    Hearing loss    High cholesterol    Hypertension    Low back pain    Obesity    OSA (obstructive sleep apnea) 06/18/2022   Renal insufficiency    Seizure disorder (HCC)     Past Surgical History:  Procedure Laterality Date   BACK SURGERY     BRAIN TUMOR EXCISION  1984   HAND SURGERY     LAPAROSCOPIC HYSTERECTOMY     SHOULDER SURGERY      Current Medications: No outpatient medications have been marked as taking for the 09/04/22 encounter (Appointment) with Chilton Si, MD.     Allergies:   Levaquin [levofloxacin]   Social History   Socioeconomic History   Marital status: Widowed    Spouse name: Not on file   Number of children: 4   Years of education: Not on file   Highest education level: Not on file  Occupational History   Not on file  Tobacco Use   Smoking status: Never   Smokeless tobacco: Never  Vaping Use   Vaping Use: Never used  Substance and Sexual Activity   Alcohol use: Not Currently   Drug use: Never   Sexual activity: Not on file  Other Topics Concern   Not on file  Social History Narrative   Lives alone   Social Determinants of  Health   Financial Resource Strain: Not on file  Food Insecurity: No Food Insecurity (07/17/2022)   Hunger Vital Sign    Worried About Running Out of Food in the Last Year: Never true    Ran Out of Food in the Last Year: Never true  Transportation Needs: No Transportation Needs (07/17/2022)   PRAPARE - Administrator, Civil ServiceTransportation    Lack of Transportation (Medical): No    Lack of Transportation (Non-Medical): No  Physical Activity: Insufficiently Active (05/09/2022)   Exercise Vital Sign    Days of Exercise per Week: 7 days    Minutes of Exercise per Session: 20 min  Stress: Not on file   Social Connections: Not on file     Family History: The patient's family history is not on file.  ROS:   Please see the history of present illness.    (+) Mild dizziness (+) Left shoulder pain (+) Left knee pain (+) Lower back pain (+) Intermittent LLE edema inferior to the knee (+) Coldness, tingling sensations of bilateral feet All other systems reviewed and are negative.  EKGs/Labs/Other Studies Reviewed:    Bilateral Carotid Dopplers  06/05/2022: Summary:  Right Carotid: Velocities in the right ICA are consistent with a 40-59%                 stenosis. The ECA appears >50% stenosed.   Left Carotid: Velocities in the left ICA are consistent with a 1-39%  stenosis.               The ECA appears >50% stenosed.   Vertebrals:  Bilateral vertebral arteries demonstrate antegrade flow.  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 Normal flow  hemodynamics were seen in the left subclavian artery.   Renal Artery Dopplers  7/27/223 Summary:  Renal:    Right: No evidence of right renal artery stenosis. Abnormal size for         the right kidney. Normal cortical thickness of right kidney.         Normal right Resisitive Index.  Left:  No evidence of left renal artery stenosis. LRV flow present.         Normal size of left kidney. Normal left Resistive Index.         Normal cortical thickness of the left kidney.  Mesenteric:  Normal Celiac artery and Superior Mesenteric artery findings. Areas of  limited  visceral study include right renal artery, right kidney size and right  parenchymal flow.   Echo  12/13/2021:  1. Left ventricular ejection fraction, by estimation, is 70 to 75%. Left  ventricular ejection fraction by 3D volume is 71 %. The left ventricle has  hyperdynamic function. The left ventricle has no regional wall motion  abnormalities. There is mild left  ventricular hypertrophy. Left ventricular diastolic parameters are  consistent with Grade I diastolic  dysfunction (impaired relaxation). The  average left ventricular global longitudinal strain is -24.4 %. The global  longitudinal strain is normal.   2. Right ventricular systolic function is normal. The right ventricular  size is normal.   3. Left atrial size was moderately dilated.   4. The mitral valve is abnormal. Mild mitral valve regurgitation.  Moderate to severe mitral annular calcification.   5. The aortic valve is tricuspid. Aortic valve regurgitation is not  visualized.   6. The inferior vena cava is normal in size with greater than 50%  respiratory variability, suggesting right atrial pressure of 3 mmHg.   Comparison(s): Changes from prior study  are noted. 01/07/2020: LVEF 55-60%,  grade 2 DD.   Nuclear Stress Myoview  01/07/2020: The left ventricular ejection fraction is normal (55-65%). Nuclear stress EF: 59%. There was no ST segment deviation noted during stress. The study is normal. This is a low risk study.   Normal resting and stress perfusion. No ischemia or infarction EF 59%  EKG:  EKG is personally reviewed. 06/18/2022: EKG was not ordered.  Recent Labs: 07/16/2022: ALT 15; B Natriuretic Peptide 513.6 07/18/2022: BUN 15; Creatinine, Ser 1.10; Hemoglobin 9.6; Platelets 266; Potassium 4.0; Sodium 136   Recent Lipid Panel No results found for: "CHOL", "TRIG", "HDL", "CHOLHDL", "VLDL", "LDLCALC", "LDLDIRECT"  Physical Exam:    VS:  There were no vitals taken for this visit. , BMI There is no height or weight on file to calculate BMI. GENERAL:  Well appearing HEENT: Pupils equal round and reactive, fundi not visualized, oral mucosa unremarkable NECK:  No jugular venous distention, waveform within normal limits, carotid upstroke brisk and symmetric, no bruits, no thyromegaly LUNGS:  Clear to auscultation bilaterally HEART:  RRR.  PMI not displaced or sustained,S1 and S2 within normal limits, no S3, no S4, no clicks, no rubs, II/VI systolic murmur at the LUSB ABD:   Flat, positive bowel sounds normal in frequency in pitch, no bruits, no rebound, no guarding, no midline pulsatile mass, no hepatomegaly, no splenomegaly EXT:  2 plus pulses throughout, no edema, no cyanosis, no clubbing SKIN:  No rashes, no nodules NEURO:  Cranial nerves II through XII grossly intact, motor grossly intact throughout PSYCH:  Cognitively intact, oriented to person place and time   ASSESSMENT/PLAN:    No problem-specific Assessment & Plan notes found for this encounter.    Screening for Secondary Hypertension:     05/09/2022    4:46 PM  Causes  Sleep Apnea Screened  Thyroid Disease Screened  Pheochromocytoma Screened  Cushing's Syndrome Screened  Coarctation of the Aorta Screened    Relevant Labs/Studies:    Latest Ref Rng & Units 07/18/2022   12:54 AM 07/17/2022    7:31 AM 07/16/2022    5:55 PM  Basic Labs  Sodium 135 - 145 mmol/L 136  139  138   Potassium 3.5 - 5.1 mmol/L 4.0  3.9  4.3   Creatinine 0.44 - 1.00 mg/dL 2.99  3.71  6.96              Latest Ref Rng & Units 05/15/2022   10:54 AM  Metanephrines/Catecholamines   Epinephrine 0 - 62 pg/mL 41   Norepinephrine 0 - 874 pg/mL 1,091   Dopamine 0 - 48 pg/mL 32   Metanephrines 0.0 - 88.0 pg/mL 45.0   Normetanephrines  0.0 - 285.2 pg/mL 130.0           12/20/2021   10:26 AM  Renovascular   Renal Artery [place] Completed Yes    Disposition:    FU with Westley Blass C. Duke Salvia, MD, [facility] in 1-2 months.  Medication Adjustments/Labs and Tests Ordered: Current medicines are reviewed at length with the patient today.  Concerns regarding medicines are outlined above.   No orders of the defined types were placed in this encounter.  No orders of the defined types were placed in this encounter.  I,Mathew Stumpf,acting as a Neurosurgeon for Chilton Si, MD.,have documented all relevant documentation on the behalf of Chilton Si, MD,as directed by  Chilton Si, MD while in the presence of Chilton Si, MD.  I, Kyla Duffy C. Duke Salvia, MD have reviewed  all documentation for this visit.  The documentation of the exam, diagnosis, procedures, and orders on 09/04/2022 are all accurate and complete.   Signed, Chilton Si, MD  09/04/2022 9:09 AM    [facility]

## 2022-09-05 ENCOUNTER — Other Ambulatory Visit: Payer: Self-pay | Admitting: Diagnostic Neuroimaging

## 2022-09-06 DIAGNOSIS — G4733 Obstructive sleep apnea (adult) (pediatric): Secondary | ICD-10-CM | POA: Diagnosis not present

## 2022-09-12 ENCOUNTER — Other Ambulatory Visit (HOSPITAL_COMMUNITY): Payer: Self-pay

## 2022-10-01 DIAGNOSIS — R69 Illness, unspecified: Secondary | ICD-10-CM | POA: Diagnosis not present

## 2022-10-06 DIAGNOSIS — G4733 Obstructive sleep apnea (adult) (pediatric): Secondary | ICD-10-CM | POA: Diagnosis not present

## 2022-10-10 DIAGNOSIS — Z794 Long term (current) use of insulin: Secondary | ICD-10-CM | POA: Diagnosis not present

## 2022-10-10 DIAGNOSIS — E782 Mixed hyperlipidemia: Secondary | ICD-10-CM | POA: Diagnosis not present

## 2022-10-10 DIAGNOSIS — I10 Essential (primary) hypertension: Secondary | ICD-10-CM | POA: Diagnosis not present

## 2022-10-10 DIAGNOSIS — M1990 Unspecified osteoarthritis, unspecified site: Secondary | ICD-10-CM | POA: Diagnosis not present

## 2022-10-10 DIAGNOSIS — M8589 Other specified disorders of bone density and structure, multiple sites: Secondary | ICD-10-CM | POA: Diagnosis not present

## 2022-10-10 DIAGNOSIS — R011 Cardiac murmur, unspecified: Secondary | ICD-10-CM | POA: Diagnosis not present

## 2022-10-10 DIAGNOSIS — E559 Vitamin D deficiency, unspecified: Secondary | ICD-10-CM | POA: Diagnosis not present

## 2022-10-10 DIAGNOSIS — E1165 Type 2 diabetes mellitus with hyperglycemia: Secondary | ICD-10-CM | POA: Diagnosis not present

## 2022-10-10 DIAGNOSIS — K219 Gastro-esophageal reflux disease without esophagitis: Secondary | ICD-10-CM | POA: Diagnosis not present

## 2022-10-17 DIAGNOSIS — N183 Chronic kidney disease, stage 3 unspecified: Secondary | ICD-10-CM | POA: Diagnosis not present

## 2022-10-17 DIAGNOSIS — I129 Hypertensive chronic kidney disease with stage 1 through stage 4 chronic kidney disease, or unspecified chronic kidney disease: Secondary | ICD-10-CM | POA: Diagnosis not present

## 2022-10-17 DIAGNOSIS — D631 Anemia in chronic kidney disease: Secondary | ICD-10-CM | POA: Diagnosis not present

## 2022-10-17 DIAGNOSIS — E1122 Type 2 diabetes mellitus with diabetic chronic kidney disease: Secondary | ICD-10-CM | POA: Diagnosis not present

## 2022-10-17 DIAGNOSIS — N2581 Secondary hyperparathyroidism of renal origin: Secondary | ICD-10-CM | POA: Diagnosis not present

## 2022-10-17 DIAGNOSIS — I5030 Unspecified diastolic (congestive) heart failure: Secondary | ICD-10-CM | POA: Diagnosis not present

## 2022-10-17 DIAGNOSIS — N1831 Chronic kidney disease, stage 3a: Secondary | ICD-10-CM | POA: Diagnosis not present

## 2022-10-29 DIAGNOSIS — N1831 Chronic kidney disease, stage 3a: Secondary | ICD-10-CM | POA: Diagnosis not present

## 2022-10-29 DIAGNOSIS — I129 Hypertensive chronic kidney disease with stage 1 through stage 4 chronic kidney disease, or unspecified chronic kidney disease: Secondary | ICD-10-CM | POA: Diagnosis not present

## 2022-11-05 ENCOUNTER — Other Ambulatory Visit: Payer: Self-pay | Admitting: Cardiology

## 2022-11-05 ENCOUNTER — Telehealth (HOSPITAL_BASED_OUTPATIENT_CLINIC_OR_DEPARTMENT_OTHER): Payer: Self-pay | Admitting: Cardiovascular Disease

## 2022-11-05 MED ORDER — CARVEDILOL 25 MG PO TABS: 25.0000 mg | ORAL_TABLET | Freq: Two times a day (BID) | ORAL | 1 refills | Status: DC

## 2022-11-05 NOTE — Telephone Encounter (Signed)
Rx request sent to pharmacy.  

## 2022-11-05 NOTE — Telephone Encounter (Signed)
*  STAT* If patient is at the pharmacy, call can be transferred to refill team.   1. Which medications need to be refilled? (please list name of each medication and dose if known) carvedilol (COREG) 25 MG tablet    2. Which pharmacy/location (including street and city if local pharmacy) is medication to be sent to? [id number] - [place], [place] - [address]   3. Do they need a 30 day or 90 day supply? 90 day  Patient has appt on 8/8 with Dr. Duke Salvia.

## 2022-11-17 ENCOUNTER — Other Ambulatory Visit: Payer: Self-pay | Admitting: Diagnostic Neuroimaging

## 2022-11-22 DIAGNOSIS — Z1231 Encounter for screening mammogram for malignant neoplasm of breast: Secondary | ICD-10-CM | POA: Diagnosis not present

## 2022-11-25 DIAGNOSIS — Z961 Presence of intraocular lens: Secondary | ICD-10-CM | POA: Diagnosis not present

## 2022-11-25 DIAGNOSIS — H40013 Open angle with borderline findings, low risk, bilateral: Secondary | ICD-10-CM | POA: Diagnosis not present

## 2022-11-25 DIAGNOSIS — E113313 Type 2 diabetes mellitus with moderate nonproliferative diabetic retinopathy with macular edema, bilateral: Secondary | ICD-10-CM | POA: Diagnosis not present

## 2022-11-25 NOTE — Telephone Encounter (Signed)
Pt has been scheduled.  °

## 2022-11-30 ENCOUNTER — Other Ambulatory Visit: Payer: Self-pay | Admitting: Cardiology

## 2022-12-13 DIAGNOSIS — K219 Gastro-esophageal reflux disease without esophagitis: Secondary | ICD-10-CM | POA: Diagnosis not present

## 2022-12-13 DIAGNOSIS — I10 Essential (primary) hypertension: Secondary | ICD-10-CM | POA: Diagnosis not present

## 2022-12-13 DIAGNOSIS — Z794 Long term (current) use of insulin: Secondary | ICD-10-CM | POA: Diagnosis not present

## 2022-12-13 DIAGNOSIS — M8589 Other specified disorders of bone density and structure, multiple sites: Secondary | ICD-10-CM | POA: Diagnosis not present

## 2022-12-13 DIAGNOSIS — E559 Vitamin D deficiency, unspecified: Secondary | ICD-10-CM | POA: Diagnosis not present

## 2022-12-13 DIAGNOSIS — E1165 Type 2 diabetes mellitus with hyperglycemia: Secondary | ICD-10-CM | POA: Diagnosis not present

## 2022-12-13 DIAGNOSIS — M1990 Unspecified osteoarthritis, unspecified site: Secondary | ICD-10-CM | POA: Diagnosis not present

## 2022-12-13 DIAGNOSIS — Z0001 Encounter for general adult medical examination with abnormal findings: Secondary | ICD-10-CM | POA: Diagnosis not present

## 2022-12-13 DIAGNOSIS — E782 Mixed hyperlipidemia: Secondary | ICD-10-CM | POA: Diagnosis not present

## 2022-12-13 DIAGNOSIS — R011 Cardiac murmur, unspecified: Secondary | ICD-10-CM | POA: Diagnosis not present

## 2023-01-02 ENCOUNTER — Ambulatory Visit (INDEPENDENT_AMBULATORY_CARE_PROVIDER_SITE_OTHER): Payer: 59 | Admitting: Cardiovascular Disease

## 2023-01-02 ENCOUNTER — Encounter (HOSPITAL_BASED_OUTPATIENT_CLINIC_OR_DEPARTMENT_OTHER): Payer: Self-pay | Admitting: Cardiovascular Disease

## 2023-01-02 VITALS — BP 176/84 | HR 70 | Ht 59.0 in | Wt 151.5 lb

## 2023-01-02 DIAGNOSIS — G4733 Obstructive sleep apnea (adult) (pediatric): Secondary | ICD-10-CM

## 2023-01-02 DIAGNOSIS — I2510 Atherosclerotic heart disease of native coronary artery without angina pectoris: Secondary | ICD-10-CM

## 2023-01-02 DIAGNOSIS — Z5181 Encounter for therapeutic drug level monitoring: Secondary | ICD-10-CM

## 2023-01-02 DIAGNOSIS — I5033 Acute on chronic diastolic (congestive) heart failure: Secondary | ICD-10-CM

## 2023-01-02 DIAGNOSIS — E782 Mixed hyperlipidemia: Secondary | ICD-10-CM

## 2023-01-02 DIAGNOSIS — Z794 Long term (current) use of insulin: Secondary | ICD-10-CM

## 2023-01-02 DIAGNOSIS — E119 Type 2 diabetes mellitus without complications: Secondary | ICD-10-CM

## 2023-01-02 DIAGNOSIS — I10 Essential (primary) hypertension: Secondary | ICD-10-CM

## 2023-01-02 MED ORDER — SPIRONOLACTONE 25 MG PO TABS: 25.0000 mg | ORAL_TABLET | Freq: Every day | ORAL | 1 refills | Status: DC

## 2023-01-02 MED ORDER — CHLORTHALIDONE 25 MG PO TABS: 25.0000 mg | ORAL_TABLET | Freq: Every day | ORAL | 1 refills | Status: DC

## 2023-01-02 MED ORDER — NITROGLYCERIN 0.4 MG SL SUBL: 0.4000 mg | SUBLINGUAL_TABLET | SUBLINGUAL | 3 refills | Status: AC | PRN

## 2023-01-02 NOTE — Assessment & Plan Note (Signed)
Continue simvastatin. 

## 2023-01-02 NOTE — Assessment & Plan Note (Signed)
BP uncontrolled as above.  Continue carvedilol, irbesartan, and spironolactone.  Adding chlorthalidone as above.  Check BMP in a week.

## 2023-01-02 NOTE — Progress Notes (Deleted)
Advanced Hypertension Clinic Follow-up:    Date:  01/02/2023   ID:  Carolyn Little, DOB Jul 12, 1945, MRN [id number]  PCP:  Jackie Plum, MD  Cardiologist:  Donato Schultz, MD  Nephrologist:  Referring MD: Jackie Plum, MD   CC: Hypertension  History of Present Illness:    Carolyn Little is a 77 y.o. female with a hx of hypertension, hyperlipidemia, diabetes, and CAD, here for follow-up. She was initially seen 05/09/2022 by [name], NP in the Advanced Hypertension Clinic. She has previously worked with Dr. [name] and self-discontinued valsartan/HCTZ and amlodipine. She was seen in the ED 09/2021 with hypertension and headaches. Echo 12/13/21 LVEF 70-75%. Renal duplex 12/20/21 negative for renal artery stenosis. She has been working with our pharmacist as well and has had labile blood pressures. She was noted to have sleep apnea but not adherent to CPAP. [name] increased her amlodipine to 2.5 mg twice daily. She repeated her sleep study which confirmed moderate sleep apnea and CPAP was recommended. She had carotid dopplers that revealed moderate stenosis on the right and mild on the left.   At her last appointment she was struggling with pain.  Blood pressure continued to be labile but somewhat improved since increasing amlodipine.  Amlodipine was increased.  She was admitted 06/2018 for for pulmonary edema.  Echo at that time revealed LVEF 65% with mild LVH and indeterminate diastolic function.  She was diuresed and blood pressures were controlled in the hospital.  Previous antihypertensives: Valsartan-HCTZ  Past Medical History:  Diagnosis Date   Arthritis    Dizziness    DM (diabetes mellitus) (HCC)    Generalized headaches    Hearing loss    High cholesterol    Hypertension    Low back pain    Obesity    OSA (obstructive sleep apnea) 06/18/2022   Renal insufficiency    Seizure disorder (HCC)     Past Surgical History:  Procedure Laterality Date   BACK SURGERY      BRAIN TUMOR EXCISION  1984   HAND SURGERY     LAPAROSCOPIC HYSTERECTOMY     SHOULDER SURGERY      Current Medications: No outpatient medications have been marked as taking for the 01/02/23 encounter (Appointment) with Chilton Si, MD.     Allergies:   Levaquin [levofloxacin]   Social History   Socioeconomic History   Marital status: Widowed    Spouse name: Not on file   Number of children: 4   Years of education: Not on file   Highest education level: Not on file  Occupational History   Not on file  Tobacco Use   Smoking status: Never   Smokeless tobacco: Never  Vaping Use   Vaping status: Never Used  Substance and Sexual Activity   Alcohol use: Not Currently   Drug use: Never   Sexual activity: Not on file  Other Topics Concern   Not on file  Social History Narrative   Lives alone   Social Determinants of Health   Financial Resource Strain: Not on file  Food Insecurity: No Food Insecurity (07/17/2022)   Hunger Vital Sign    Worried About Running Out of Food in the Last Year: Never true    Ran Out of Food in the Last Year: Never true  Transportation Needs: No Transportation Needs (07/17/2022)   PRAPARE - Administrator, Civil Service (Medical): No    Lack of Transportation (Non-Medical): No  Physical Activity: Insufficiently Active (05/09/2022)  Exercise Vital Sign    Days of Exercise per Week: 7 days    Minutes of Exercise per Session: 20 min  Stress: Not on file  Social Connections: Not on file     Family History: The patient's family history is not on file.  ROS:   Please see the history of present illness.    (+) Mild dizziness (+) Left shoulder pain (+) Left knee pain (+) Lower back pain (+) Intermittent LLE edema inferior to the knee (+) Coldness, tingling sensations of bilateral feet All other systems reviewed and are negative.  EKGs/Labs/Other Studies Reviewed:    Bilateral Carotid Dopplers  06/05/2022: Summary:  Right  Carotid: Velocities in the right ICA are consistent with a 40-59%                 stenosis. The ECA appears >50% stenosed.   Left Carotid: Velocities in the left ICA are consistent with a 1-39%  stenosis.               The ECA appears >50% stenosed.   Vertebrals:  Bilateral vertebral arteries demonstrate antegrade flow.  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 Normal flow  hemodynamics were seen in the left subclavian artery.   Renal Artery Dopplers  7/27/223 Summary:  Renal:    Right: No evidence of right renal artery stenosis. Abnormal size for         the right kidney. Normal cortical thickness of right kidney.         Normal right Resisitive Index.  Left:  No evidence of left renal artery stenosis. LRV flow present.         Normal size of left kidney. Normal left Resistive Index.         Normal cortical thickness of the left kidney.  Mesenteric:  Normal Celiac artery and Superior Mesenteric artery findings. Areas of  limited  visceral study include right renal artery, right kidney size and right  parenchymal flow.   Echo  12/13/2021:  1. Left ventricular ejection fraction, by estimation, is 70 to 75%. Left  ventricular ejection fraction by 3D volume is 71 %. The left ventricle has  hyperdynamic function. The left ventricle has no regional wall motion  abnormalities. There is mild left  ventricular hypertrophy. Left ventricular diastolic parameters are  consistent with Grade I diastolic dysfunction (impaired relaxation). The  average left ventricular global longitudinal strain is -24.4 %. The global  longitudinal strain is normal.   2. Right ventricular systolic function is normal. The right ventricular  size is normal.   3. Left atrial size was moderately dilated.   4. The mitral valve is abnormal. Mild mitral valve regurgitation.  Moderate to severe mitral annular calcification.   5. The aortic valve is tricuspid. Aortic valve regurgitation is not  visualized.    6. The inferior vena cava is normal in size with greater than 50%  respiratory variability, suggesting right atrial pressure of 3 mmHg.   Comparison(s): Changes from prior study are noted. 01/07/2020: LVEF 55-60%,  grade 2 DD.   Nuclear Stress Myoview  01/07/2020: The left ventricular ejection fraction is normal (55-65%). Nuclear stress EF: 59%. There was no ST segment deviation noted during stress. The study is normal. This is a low risk study.   Normal resting and stress perfusion. No ischemia or infarction EF 59%  EKG:  EKG is personally reviewed. 06/18/2022: EKG was not ordered.  Recent Labs: 07/16/2022: ALT 15; B Natriuretic Peptide 513.6 07/18/2022: BUN 15;  Creatinine, Ser 1.10; Hemoglobin 9.6; Platelets 266; Potassium 4.0; Sodium 136   Recent Lipid Panel No results found for: "CHOL", "TRIG", "HDL", "CHOLHDL", "VLDL", "LDLCALC", "LDLDIRECT"  Physical Exam:    VS:  There were no vitals taken for this visit. , BMI There is no height or weight on file to calculate BMI. GENERAL:  Well appearing HEENT: Pupils equal round and reactive, fundi not visualized, oral mucosa unremarkable NECK:  No jugular venous distention, waveform within normal limits, carotid upstroke brisk and symmetric, no bruits, no thyromegaly LUNGS:  Clear to auscultation bilaterally HEART:  RRR.  PMI not displaced or sustained,S1 and S2 within normal limits, no S3, no S4, no clicks, no rubs, II/VI systolic murmur at the LUSB ABD:  Flat, positive bowel sounds normal in frequency in pitch, no bruits, no rebound, no guarding, no midline pulsatile mass, no hepatomegaly, no splenomegaly EXT:  2 plus pulses throughout, no edema, no cyanosis, no clubbing SKIN:  No rashes, no nodules NEURO:  Cranial nerves II through XII grossly intact, motor grossly intact throughout PSYCH:  Cognitively intact, oriented to person place and time   ASSESSMENT/PLAN:    No problem-specific Assessment & Plan notes found for this  encounter.    Screening for Secondary Hypertension:     05/09/2022    4:46 PM  Causes  Sleep Apnea Screened  Thyroid Disease Screened  Pheochromocytoma Screened  Cushing's Syndrome Screened  Coarctation of the Aorta Screened    Relevant Labs/Studies:    Latest Ref Rng & Units 07/18/2022   12:54 AM 07/17/2022    7:31 AM 07/16/2022    5:55 PM  Basic Labs  Sodium 135 - 145 mmol/L 136  139  138   Potassium 3.5 - 5.1 mmol/L 4.0  3.9  4.3   Creatinine 0.44 - 1.00 mg/dL 1.61  0.96  0.45              Latest Ref Rng & Units 05/15/2022   10:54 AM  Metanephrines/Catecholamines   Epinephrine 0 - 62 pg/mL 41   Norepinephrine 0 - 874 pg/mL 1,091   Dopamine 0 - 48 pg/mL 32   Metanephrines 0.0 - 88.0 pg/mL 45.0   Normetanephrines  0.0 - 285.2 pg/mL 130.0           12/20/2021   10:26 AM  Renovascular   Renal Artery [place] Completed Yes    Disposition:    FU with  C. Duke Salvia, MD, [facility] in 1-2 months.  Medication Adjustments/Labs and Tests Ordered: Current medicines are reviewed at length with the patient today.  Concerns regarding medicines are outlined above.   No orders of the defined types were placed in this encounter.  No orders of the defined types were placed in this encounter.  I,Mathew Stumpf,acting as a Neurosurgeon for Chilton Si, MD.,have documented all relevant documentation on the behalf of Chilton Si, MD,as directed by  Chilton Si, MD while in the presence of Chilton Si, MD.  I,  C. Duke Salvia, MD have reviewed all documentation for this visit.  The documentation of the exam, diagnosis, procedures, and orders on 01/02/2023 are all accurate and complete.   Signed, Chilton Si, MD  01/02/2023 8:13 AM    [facility]

## 2023-01-02 NOTE — Assessment & Plan Note (Signed)
Volume status improved.  She is no longer on amlodipine.  BP very uncontrolled.  She hasn't been using lasix regularly.  We will stop lasix and start chlorthalidone 25mg  daily.  Continue carvedilol and spironolactone.  Continue Jardiance.

## 2023-01-02 NOTE — Progress Notes (Addendum)
[facility] Follow-up:    Date:  01/02/2023   ID:  Carolyn Little, DOB 1945-06-29, MRN [id number]  PCP:  Jackie Plum, MD  Cardiologist:  Donato Schultz, MD  Nephrologist:  Referring MD: Jackie Plum, MD   CC: Hypertension  History of Present Illness:    Carolyn Little is a 77 y.o. female with a hx of hypertension, hyperlipidemia, diabetes, and CAD, here for follow-up. She was initially seen 05/09/2022 by [name], NP in the [facility]. She has previously worked with Dr. [name] and self-discontinued valsartan/HCTZ and amlodipine. She was seen in the ED 09/2021 with hypertension and headaches. Echo 12/13/21 LVEF 70-75%. Renal duplex 12/20/21 negative for renal artery stenosis. She has been working with our pharmacist as well and has had labile blood pressures. She was noted to have sleep apnea but not adherent to CPAP. [name] increased her amlodipine to 2.5 mg twice daily. She repeated her sleep study which confirmed moderate sleep apnea and CPAP was recommended. She had carotid dopplers that revealed moderate stenosis on the right and mild on the left.   At her last appointment she was struggling with pain.  Blood pressure continued to be labile but somewhat improved since increasing amlodipine.  Amlodipine was increased.  She was admitted 06/2018 for for pulmonary edema.  Echo at that time revealed LVEF 65% with mild LVH and indeterminate diastolic function.  She was diuresed and blood pressures were controlled in the hospital.  Today, she is accompanied by a professional interpreter and her daughter. She is generally feeling okay but recently had a headache for 1.5 days which she describes as more of a soreness and improved with tylenol. Previously she hasn't had many headaches. Additionally she complains of chronic bilateral shoulder pain attributable to arthritis. Previously she had issues of blurry or cloudy/smoky vision, has resolved at this time.  She continues to use her eye drops. In the office today her blood pressure is 186/75 initially, and 176/84 on manual recheck. She states that her home readings are "up and down" as usual. Sometimes she sees readings as high as 180-200 systolic, and as low as 124-134 at other times. Lately she does not have the sudden drops in blood pressure as she had in the past. Also, she confirms that amlodipine had been discontinued in the hospital; she had been suffering from 3 weeks of leg swelling, shortness of breath, and high blood pressure. She is taking her spironolactone, one tablet daily. At this time she denies any swelling issues. She denies any palpitations, chest pain, syncope, orthopnea, or PND.  Previous antihypertensives: Valsartan-HCTZ  Past Medical History:  Diagnosis Date   Arthritis    Dizziness    DM (diabetes mellitus) (HCC)    Generalized headaches    Hearing loss    High cholesterol    Hypertension    Low back pain    Obesity    OSA (obstructive sleep apnea) 06/18/2022   Renal insufficiency    Seizure disorder (HCC)     Past Surgical History:  Procedure Laterality Date   BACK SURGERY     BRAIN TUMOR EXCISION  1984   HAND SURGERY     LAPAROSCOPIC HYSTERECTOMY     SHOULDER SURGERY      Current Medications: Current Meds  Medication Sig   carvedilol (COREG) 25 MG tablet Take 1 tablet (25 mg total) by mouth 2 (two) times daily with a meal.   chlorthalidone (HYGROTON) 25 MG tablet Take 1 tablet (25 mg  total) by mouth daily.   [facility] ASPIRIN ADULT LOW DOSE 81 MG chewable tablet Chew 81 mg by mouth daily.   Dulaglutide (TRULICITY) 3 MG/0.5ML SOPN Inject 3 mg into the skin every 7 (seven) days.   empagliflozin (JARDIANCE) 10 MG TABS tablet Take 1 tablet (10 mg total) by mouth daily.   famotidine (PEPCID) 40 MG tablet Take 40 mg by mouth daily.   Insulin Degludec (TRESIBA) 100 UNIT/ML SOLN Inject 20 Units into the skin every evening.   irbesartan (AVAPRO) 75 MG tablet Take 1  tablet (75 mg total) by mouth daily.   lamoTRIgine (LAMICTAL) 150 MG tablet TAKE 1 TABLET BY MOUTH TWICE A DAY   levETIRAcetam (KEPPRA) 500 MG tablet TAKE 1 TABLET BY MOUTH DAILY   metFORMIN (GLUCOPHAGE) 500 MG tablet Take 500 mg by mouth 2 (two) times daily.   nitroGLYCERIN (NITROSTAT) 0.4 MG SL tablet Place 1 tablet (0.4 mg total) under the tongue every 5 (five) minutes as needed for chest pain.   simvastatin (ZOCOR) 40 MG tablet Take 40 mg by mouth daily.   [DISCONTINUED] spironolactone (ALDACTONE) 25 MG tablet Take 1 tablet (25 mg total) by mouth daily.     Allergies:   Levaquin [levofloxacin]   Social History   Socioeconomic History   Marital status: Widowed    Spouse name: Not on file   Number of children: 4   Years of education: Not on file   Highest education level: Not on file  Occupational History   Not on file  Tobacco Use   Smoking status: Never   Smokeless tobacco: Never  Vaping Use   Vaping status: Never Used  Substance and Sexual Activity   Alcohol use: Not Currently   Drug use: Never   Sexual activity: Not on file  Other Topics Concern   Not on file  Social History Narrative   Lives alone   Social Determinants of Health   Financial Resource Strain: Not on file  Food Insecurity: No Food Insecurity (07/17/2022)   Hunger Vital Sign    Worried About Running Out of Food in the Last Year: Never true    Ran Out of Food in the Last Year: Never true  Transportation Needs: No Transportation Needs (07/17/2022)   PRAPARE - Administrator, Civil Service (Medical): No    Lack of Transportation (Non-Medical): No  Physical Activity: Insufficiently Active (05/09/2022)   Exercise Vital Sign    Days of Exercise per Week: 7 days    Minutes of Exercise per Session: 20 min  Stress: Not on file  Social Connections: Not on file     Family History: The patient's family history is not on file.  ROS:   Please see the history of present illness.    (+) Recent  headache  (+) Chronic shoulder pain All other systems reviewed and are negative.  EKGs/Labs/Other Studies Reviewed:    Echo  07/18/2022:  1. Left ventricular ejection fraction, by estimation, is 65%. The left  ventricle has normal function. The left ventricle has no regional wall  motion abnormalities. There is mild left ventricular hypertrophy. Left  ventricular diastolic parameters are indeterminate.   2. Right ventricular systolic function is normal. The right ventricular  size is normal. Tricuspid regurgitation signal is inadequate for assessing  PA pressure.   3. Left atrial size was mildly dilated.   4. The mitral valve is degenerative. Trivial mitral valve regurgitation.  No evidence of mitral stenosis. Moderate to severe mitral annular  calcification.   5. The aortic valve is grossly normal. There is mild calcification of the  aortic valve. Aortic valve regurgitation is not visualized. Aortic valve  sclerosis/calcification is present, without any evidence of aortic  stenosis.   6. The inferior vena cava is dilated in size with >50% respiratory  variability, suggesting right atrial pressure of 8 mmHg.   CTA Chest  07/16/2022: IMPRESSION: 1. No pulmonary embolus. 2. Bilateral parahilar ground-glass opacities, likely pulmonary edema. 3. Small pleural effusions.   Aortic Atherosclerosis (ICD10-I70.0).  Bilateral Carotid Dopplers  06/05/2022: Summary:  Right Carotid: Velocities in the right ICA are consistent with a 40-59%                 stenosis. The ECA appears >50% stenosed.   Left Carotid: Velocities in the left ICA are consistent with a 1-39%  stenosis. The ECA appears >50% stenosed.   Vertebrals:  Bilateral vertebral arteries demonstrate antegrade flow.  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 Normal flow  hemodynamics were seen in the left subclavian artery.   Renal Artery Dopplers  7/27/223 Summary:  Renal:    Right: No evidence of right renal  artery stenosis. Abnormal size for         the right kidney. Normal cortical thickness of right kidney.         Normal right Resisitive Index.  Left:  No evidence of left renal artery stenosis. LRV flow present.         Normal size of left kidney. Normal left Resistive Index.         Normal cortical thickness of the left kidney.  Mesenteric:  Normal Celiac artery and Superior Mesenteric artery findings. Areas of  limited  visceral study include right renal artery, right kidney size and right  parenchymal flow.   Echo  12/13/2021:  1. Left ventricular ejection fraction, by estimation, is 70 to 75%. Left  ventricular ejection fraction by 3D volume is 71 %. The left ventricle has  hyperdynamic function. The left ventricle has no regional wall motion  abnormalities. There is mild left  ventricular hypertrophy. Left ventricular diastolic parameters are  consistent with Grade I diastolic dysfunction (impaired relaxation). The  average left ventricular global longitudinal strain is -24.4 %. The global  longitudinal strain is normal.   2. Right ventricular systolic function is normal. The right ventricular  size is normal.   3. Left atrial size was moderately dilated.   4. The mitral valve is abnormal. Mild mitral valve regurgitation.  Moderate to severe mitral annular calcification.   5. The aortic valve is tricuspid. Aortic valve regurgitation is not  visualized.   6. The inferior vena cava is normal in size with greater than 50%  respiratory variability, suggesting right atrial pressure of 3 mmHg.   Comparison(s): Changes from prior study are noted. 01/07/2020: LVEF 55-60%,  grade 2 DD.   Nuclear Stress Myoview  01/07/2020: The left ventricular ejection fraction is normal (55-65%). Nuclear stress EF: 59%. There was no ST segment deviation noted during stress. The study is normal. This is a low risk study.   Normal resting and stress perfusion. No ischemia or infarction EF 59%  EKG:   EKG is personally reviewed. 01/02/2023:  Not ordered. 06/18/2022: EKG was not ordered.  Recent Labs: 07/16/2022: ALT 15; B Natriuretic Peptide 513.6 07/18/2022: BUN 15; Creatinine, Ser 1.10; Hemoglobin 9.6; Platelets 266; Potassium 4.0; Sodium 136   Recent Lipid Panel No results found for: "CHOL", "TRIG", "HDL", "CHOLHDL", "VLDL", "LDLCALC", "  LDLDIRECT"  Physical Exam:    VS:  BP (!) 176/84 (BP Location: Left Arm, Patient Position: Sitting, Cuff Size: Large)   Pulse 70   Ht 4\' 11"  (1.499 m)   Wt 151 lb 8 oz (68.7 kg)   SpO2 100%   BMI 30.60 kg/m  , BMI Body mass index is 30.6 kg/m. GENERAL:  Well appearing HEENT: Pupils equal round and reactive, fundi not visualized, oral mucosa unremarkable NECK:  No jugular venous distention, waveform within normal limits, carotid upstroke brisk and symmetric, no bruits, no thyromegaly LUNGS:  Clear to auscultation bilaterally HEART:  RRR.  PMI not displaced or sustained,S1 and S2 within normal limits, no S3, no S4, no clicks, no rubs, II/VI systolic murmur at the LUSB ABD:  Flat, positive bowel sounds normal in frequency in pitch, no bruits, no rebound, no guarding, no midline pulsatile mass, no hepatomegaly, no splenomegaly EXT:  2 plus pulses throughout, no edema, no cyanosis, no clubbing SKIN:  No rashes, no nodules NEURO:  Cranial nerves II through XII grossly intact, motor grossly intact throughout PSYCH:  Cognitively intact, oriented to person place and time   ASSESSMENT/PLAN:    Acute on chronic diastolic CHF (congestive heart failure) (HCC) Volume status improved.  She is no longer on amlodipine.  BP very uncontrolled.  She hasn't been using lasix regularly.  We will stop lasix and start chlorthalidone 25mg  daily.  Continue carvedilol and spironolactone.  Continue Jardiance.    Hypertension BP uncontrolled as above.  Continue carvedilol, irbesartan, and spironolactone.  Adding chlorthalidone as above.  Check BMP in a week.   Mixed  hyperlipidemia Continue simvastatin.   CAD s/p PCI:  She reports prior stents.  Denies chest pain.  She requests refill of prn nitroglycerin.  Screening for Secondary Hypertension:     05/09/2022    4:46 PM  Causes  Sleep Apnea Screened  Thyroid Disease Screened  Pheochromocytoma Screened  Cushing's Syndrome Screened  Coarctation of the Aorta Screened    Relevant Labs/Studies:    Latest Ref Rng & Units 07/18/2022   12:54 AM 07/17/2022    7:31 AM 07/16/2022    5:55 PM  Basic Labs  Sodium 135 - 145 mmol/L 136  139  138   Potassium 3.5 - 5.1 mmol/L 4.0  3.9  4.3   Creatinine 0.44 - 1.00 mg/dL 1.61  0.96  0.45              Latest Ref Rng & Units 05/15/2022   10:54 AM  Metanephrines/Catecholamines   Epinephrine 0 - 62 pg/mL 41   Norepinephrine 0 - 874 pg/mL 1,091   Dopamine 0 - 48 pg/mL 32   Metanephrines 0.0 - 88.0 pg/mL 45.0   Normetanephrines  0.0 - 285.2 pg/mL 130.0           12/20/2021   10:26 AM  Renovascular   Renal Artery [place] Completed Yes    Disposition:    FU with  C. Duke Salvia, MD, [facility] in 1-2 months.  Medication Adjustments/Labs and Tests Ordered: Current medicines are reviewed at length with the patient today.  Concerns regarding medicines are outlined above.   Orders Placed This Encounter  Procedures   Basic metabolic panel   Meds ordered this encounter  Medications   spironolactone (ALDACTONE) 25 MG tablet    Sig: Take 1 tablet (25 mg total) by mouth daily.    Dispense:  90 tablet    Refill:  1   chlorthalidone (HYGROTON) 25 MG tablet  Sig: Take 1 tablet (25 mg total) by mouth daily.    Dispense:  90 tablet    Refill:  1   nitroGLYCERIN (NITROSTAT) 0.4 MG SL tablet    Sig: Place 1 tablet (0.4 mg total) under the tongue every 5 (five) minutes as needed for chest pain.    Dispense:  25 tablet    Refill:  3   I,Mathew Stumpf,acting as a scribe for Chilton Si, MD.,have documented all relevant documentation on the behalf of Chilton Si, MD,as directed by  Chilton Si, MD while in the presence of Chilton Si, MD.  I,  C. Duke Salvia, MD have reviewed all documentation for this visit.  The documentation of the exam, diagnosis, procedures, and orders on 01/02/2023 are all accurate and complete.  Signed, Chilton Si, MD  01/02/2023 1:04 PM    [facility]

## 2023-01-02 NOTE — Patient Instructions (Signed)
Medication Instructions:  STOP FUROSEMIDE   START CHLORTHALIDONE 25 MG DAILY   Labwork: BMET IN 1 WEEK   Testing/Procedures: NONE  Follow-Up: 1 MONTH WITH DR Ollen Barges NP, OR PHARM D

## 2023-01-13 ENCOUNTER — Encounter: Payer: Self-pay | Admitting: Diagnostic Neuroimaging

## 2023-01-13 ENCOUNTER — Ambulatory Visit (INDEPENDENT_AMBULATORY_CARE_PROVIDER_SITE_OTHER): Payer: 59 | Admitting: Diagnostic Neuroimaging

## 2023-01-13 VITALS — BP 148/58 | HR 70 | Ht 59.0 in | Wt 151.0 lb

## 2023-01-13 DIAGNOSIS — G40109 Localization-related (focal) (partial) symptomatic epilepsy and epileptic syndromes with simple partial seizures, not intractable, without status epilepticus: Secondary | ICD-10-CM | POA: Diagnosis not present

## 2023-01-13 DIAGNOSIS — Z5181 Encounter for therapeutic drug level monitoring: Secondary | ICD-10-CM | POA: Diagnosis not present

## 2023-01-13 DIAGNOSIS — I10 Essential (primary) hypertension: Secondary | ICD-10-CM | POA: Diagnosis not present

## 2023-01-13 MED ORDER — LAMOTRIGINE 150 MG PO TABS: 150.0000 mg | ORAL_TABLET | Freq: Every morning | ORAL | 4 refills | Status: DC

## 2023-01-13 MED ORDER — LEVETIRACETAM 500 MG PO TABS: 500.0000 mg | ORAL_TABLET | Freq: Every evening | ORAL | 4 refills | Status: DC

## 2023-01-13 NOTE — Progress Notes (Signed)
[facility]  PATIENT: Carolyn Little DOB: 1945-08-25  REFERRING CLINICIAN: Jackie Plum, MD HISTORY FROM: patient and daughter REASON FOR VISIT: follow up   HISTORICAL  CHIEF COMPLAINT:  Chief Complaint  Patient presents with   Follow-up    Pt with daughter, rm 6 states overall stable. No seizure. Last sz was 3 years.    HISTORY OF PRESENT ILLNESS:   UPDATE (01/13/23, VRP): Since last visit, doing well. Symptoms are stable. No further seizures since ~2018. Tolerating meds.    UPDATE (06/26/21, VRP): Since last visit, doing well. No seizures. Tolerating meds. Actually pt was only taking levetiracetam and lamictal once daily each (not twice a day).   PRIOR HPI (02/01/20): 77 year old female here for evaluation of seizure disorder.  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  Around 2008 patient had onset of staring spells and was diagnosed with temporal lobe epilepsy.  No convulsive seizures.  She was started on lamotrigine and then Keppra was added on.  Since 2010 patient did well did not have any major seizures.  She may have had a breakthrough seizure in 2018 but unclear.  Since that time doing well on lamotrigine and Keppra.  Daughter notes some memory loss and forgetfulness for the past 1 year.  No change in ADLs.  Patient used to live in [place] and moved to [place] in June 2021.   REVIEW OF SYSTEMS: Full 14 system review of systems performed and negative with exception of: As per HPI.  ALLERGIES: Allergies  Allergen Reactions   Atorvastatin Nausea And Vomiting   Levaquin [Levofloxacin] Hives    HOME MEDICATIONS: Outpatient Medications Prior to Visit  Medication Sig Dispense Refill   ACCU-CHEK GUIDE test strip as directed as directed     acetaminophen (TYLENOL) 325 MG tablet Take 325-650 mg by mouth every 6 (six) hours as needed.      Blood Glucose Monitoring Suppl (ACCU-CHEK GUIDE) w/Device KIT      Blood Glucose Monitoring Suppl (GLUCOCOM BLOOD GLUCOSE MONITOR) DEVI 1 Device by Misc.(Non-Drug; Combo Route) route as needed (to check blood glucose).     Blood Glucose Monitoring Suppl (ONETOUCH VERIO FLEX SYSTEM) w/Device KIT 1 DEVICE BY MISC.(NON-DRUG COMBO ROUTE) ROUTE AS NEEDED (TO CHECK BLOOD GLUCOSE).     carvedilol (COREG) 25 MG tablet Take 1 tablet (25 mg total) by mouth 2 (two) times daily with a meal. 180 tablet 1   chlorthalidone (HYGROTON) 25 MG tablet Take 1 tablet (25 mg total) by mouth daily. 90 tablet 1   Continuous Blood Gluc Receiver (DEXCOM G6 RECEIVER) DEVI Use as directed for continuous glucose monitoring.     Continuous Blood Gluc Sensor (DEXCOM G6 SENSOR) MISC Inject 1 sensor to the skin every 10 days for continuous glucose monitoring.     Continuous Blood Gluc Transmit (DEXCOM G6 TRANSMITTER) MISC Use as directed for continuous glucose monitoring. Reuse transmitter for 90 days then discard and replace.     CVS ASPIRIN ADULT LOW DOSE 81 MG chewable tablet Chew 81 mg by mouth daily.     diclofenac Sodium (VOLTAREN) 1 % GEL Apply 2 g topically 4 (four) times daily as needed.     Dulaglutide (TRULICITY) 3 MG/0.5ML SOPN Inject 3 mg into the skin every 7 (seven) days.     famotidine (PEPCID) 40 MG tablet Take 40 mg by mouth daily.     glucose blood (ONETOUCH VERIO) test strip  To monitor sugar 4 times per day     Insulin Degludec (TRESIBA) 100 UNIT/ML SOLN Inject 20 Units into the skin every evening.     Insulin Pen Needle (BD PEN NEEDLE NANO U/F) 32G X 4 MM MISC Use once daily for [place] and once weekly for Trulicity     irbesartan (AVAPRO) 75 MG tablet Take 1 tablet (75 mg total) by mouth daily. 30 tablet 10   Lancets (ONETOUCH DELICA PLUS LANCET30G) MISC 1 DEVICE BY MISC.(NON-DRUG COMBO ROUTE) ROUTE 4 TIMES DAILY.     metFORMIN (GLUCOPHAGE) 500 MG tablet Take 500 mg by mouth 2 (two) times daily.      nitroGLYCERIN (NITROSTAT) 0.4 MG SL tablet Place 1 tablet (0.4 mg total) under the tongue every 5 (five) minutes as needed for chest pain. 25 tablet 3   ONETOUCH VERIO test strip 4 (four) times daily.     SIMBRINZA 1-0.2 % SUSP Apply 1 drop to eye 2 (two) times daily.     simvastatin (ZOCOR) 40 MG tablet Take 40 mg by mouth daily.     spironolactone (ALDACTONE) 25 MG tablet Take 1 tablet (25 mg total) by mouth daily. 90 tablet 1   amLODipine (NORVASC) 2.5 MG tablet Take 2.5 mg by mouth daily.     lamoTRIgine (LAMICTAL) 150 MG tablet TAKE 1 TABLET BY MOUTH TWICE A DAY 180 tablet 4   levETIRAcetam (KEPPRA) 500 MG tablet TAKE 1 TABLET BY MOUTH DAILY 90 tablet 0   empagliflozin (JARDIANCE) 10 MG TABS tablet Take 1 tablet (10 mg total) by mouth daily. 30 tablet 10   No facility-administered medications prior to visit.    PAST MEDICAL HISTORY: Past Medical History:  Diagnosis Date   Arthritis    Dizziness    DM (diabetes mellitus) (HCC)    Generalized headaches    Hearing loss    High cholesterol    Hypertension    Low back pain    Obesity    OSA (obstructive sleep apnea) 06/18/2022   Renal insufficiency    Seizure disorder (HCC)     PAST SURGICAL HISTORY: Past Surgical History:  Procedure Laterality Date   BACK SURGERY     BRAIN TUMOR EXCISION  1984   HAND SURGERY     LAPAROSCOPIC HYSTERECTOMY     SHOULDER SURGERY      FAMILY HISTORY: No family history on file.  SOCIAL HISTORY: Social History   Socioeconomic History   Marital status: Widowed    Spouse name: Not on file   Number of children: 4   Years of education: Not on file   Highest education level: Not on file  Occupational History   Not on file  Tobacco Use   Smoking status: Never   Smokeless tobacco: Never  Vaping Use   Vaping status: Never Used  Substance and Sexual Activity   Alcohol use: Not Currently   Drug use: Never   Sexual activity: Not on file  Other Topics Concern   Not on file  Social  History Narrative   Lives alone   Social Determinants of Health   Financial Resource Strain: Not on file  Food Insecurity: No Food Insecurity (07/17/2022)   Hunger Vital Sign    Worried About Running Out of Food in the Last Year: Never true    Ran Out of Food in the Last Year: Never true  Transportation Needs: No Transportation Needs (07/17/2022)   PRAPARE - Administrator, Civil Service (Medical): No  Lack of Transportation (Non-Medical): No  Physical Activity: Insufficiently Active (05/09/2022)   Exercise Vital Sign    Days of Exercise per Week: 7 days    Minutes of Exercise per Session: 20 min  Stress: Not on file  Social Connections: Not on file  Intimate Partner Violence: Not At Risk (07/17/2022)   Humiliation, Afraid, Rape, and Kick questionnaire    Fear of Current or Ex-Partner: No    Emotionally Abused: No    Physically Abused: No    Sexually Abused: No     PHYSICAL EXAM  GENERAL EXAM/CONSTITUTIONAL: Vitals:  Vitals:   01/13/23 1001 01/13/23 1014 01/13/23 1015  BP: (!) 170/84 (!) 140/62 (!) 148/58  Pulse: 70    Weight: 151 lb (68.5 kg)    Height: 4\' 11"  (1.499 m)     Body mass index is 30.5 kg/m. Wt Readings from Last 3 Encounters:  01/13/23 151 lb (68.5 kg)  01/02/23 151 lb 8 oz (68.7 kg)  07/18/22 159 lb 2.8 oz (72.2 kg)   Patient is in no distress; well developed, nourished and groomed; neck is supple  CARDIOVASCULAR: Examination of carotid arteries is normal; no carotid bruits Regular rate and rhythm, no murmurs Examination of peripheral vascular system by observation and palpation is normal  EYES: Ophthalmoscopic exam of optic discs and posterior segments is normal; no papilledema or hemorrhages No results found.  MUSCULOSKELETAL: Gait, strength, tone, movements noted in Neurologic exam below  NEUROLOGIC: MENTAL STATUS:      No data to display         awake, alert, oriented to person, place and time recent and remote memory  intact normal attention and concentration language fluent, comprehension intact, naming intact fund of knowledge appropriate  CRANIAL NERVE:  2nd - no papilledema on fundoscopic exam 2nd, 3rd, 4th, 6th - pupils equal and reactive to light, visual fields full to confrontation, extraocular muscles intact, no nystagmus 5th - facial sensation symmetric 7th - facial strength --> LEFT UPPER AND LEFT LOWER FACIAL WEAKNESS 8th - hearing --> DECR IN LEFT 9th - palate elevates symmetrically, uvula midline 11th - shoulder shrug symmetric 12th - tongue protrusion midline  MOTOR:  normal bulk and tone, full strength in the BUE, BLE  SENSORY:  normal and symmetric to light touch  COORDINATION:  finger-nose-finger, fine finger movements normal  REFLEXES:  deep tendon reflexes 1+ and symmetric  GAIT/STATION:  narrow based gait     DIAGNOSTIC DATA (LABS, IMAGING, TESTING) - I reviewed patient records, labs, notes, testing and imaging myself where available.  Lab Results  Component Value Date   WBC 6.5 07/18/2022   HGB 9.6 (L) 07/18/2022   HCT 30.1 (L) 07/18/2022   MCV 83.1 07/18/2022   PLT 266 07/18/2022      Component Value Date/Time   NA 136 07/18/2022 0054   K 4.0 07/18/2022 0054   CL 99 07/18/2022 0054   CO2 27 07/18/2022 0054   GLUCOSE 245 (H) 07/18/2022 0054   BUN 15 07/18/2022 0054   CREATININE 1.10 (H) 07/18/2022 0054   CALCIUM 8.6 (L) 07/18/2022 0054   PROT 7.3 07/16/2022 1755   ALBUMIN 4.2 07/16/2022 1755   AST 12 (L) 07/16/2022 1755   ALT 15 07/16/2022 1755   ALKPHOS 55 07/16/2022 1755   BILITOT 0.6 07/16/2022 1755   GFRNONAA 52 (L) 07/18/2022 0054   No results found for: "CHOL", "HDL", "LDLCALC", "LDLDIRECT", "TRIG", "CHOLHDL" No results found for: "HGBA1C" No results found for: "VITAMINB12" No results found for: "  TSH"     ASSESSMENT AND PLAN  77 y.o. year old female here with history of seizures since 2008, with last seizures in 2010 and possibly  2018.  Stable on medication.  Also history of left acoustic neuroma status post resection 1984, stable.  Dx:  1. Temporal lobe epilepsy (HCC)      PLAN:  Temporal lobe epilepsy (stable doses from MA; was only on 1 tab daily each for the meds, and stable; last seizure ~2018) - continue lamotrigine 150mg  in AM - continue levetiracetam 500mg  in PM  HYPERTENSION - follow up with PCP  Meds ordered this encounter  Medications   levETIRAcetam (KEPPRA) 500 MG tablet    Sig: Take 1 tablet (500 mg total) by mouth every evening.    Dispense:  90 tablet    Refill:  4   lamoTRIgine (LAMICTAL) 150 MG tablet    Sig: Take 1 tablet (150 mg total) by mouth in the morning.    Dispense:  90 tablet    Refill:  4   Return in about 1 year (around 01/13/2024).    Suanne Marker, MD 01/13/2023, 10:26 AM Certified in Neurology, Neurophysiology and Neuroimaging  [facility] [address] [place], [place] [address] [phone]

## 2023-01-14 LAB — BASIC METABOLIC PANEL
Chloride: 107 mmol/L — ABNORMAL HIGH (ref 96–106)
eGFR: 51 mL/min/{1.73_m2} — ABNORMAL LOW (ref >59)

## 2023-01-30 ENCOUNTER — Other Ambulatory Visit: Payer: Self-pay | Admitting: Cardiology

## 2023-02-03 ENCOUNTER — Other Ambulatory Visit: Payer: Self-pay | Admitting: Cardiology

## 2023-02-03 ENCOUNTER — Other Ambulatory Visit (HOSPITAL_BASED_OUTPATIENT_CLINIC_OR_DEPARTMENT_OTHER): Payer: Self-pay | Admitting: Cardiovascular Disease

## 2023-02-04 NOTE — Telephone Encounter (Signed)
Rx request sent to pharmacy.  

## 2023-02-06 ENCOUNTER — Ambulatory Visit (INDEPENDENT_AMBULATORY_CARE_PROVIDER_SITE_OTHER): Payer: 59 | Admitting: Family

## 2023-02-06 ENCOUNTER — Other Ambulatory Visit (HOSPITAL_BASED_OUTPATIENT_CLINIC_OR_DEPARTMENT_OTHER): Payer: Self-pay | Admitting: Family

## 2023-02-06 ENCOUNTER — Encounter (HOSPITAL_BASED_OUTPATIENT_CLINIC_OR_DEPARTMENT_OTHER): Payer: Self-pay | Admitting: Family

## 2023-02-06 VITALS — BP 192/80 | HR 70 | Ht 59.0 in | Wt 153.0 lb

## 2023-02-06 DIAGNOSIS — I10 Essential (primary) hypertension: Secondary | ICD-10-CM | POA: Diagnosis not present

## 2023-02-06 DIAGNOSIS — I2510 Atherosclerotic heart disease of native coronary artery without angina pectoris: Secondary | ICD-10-CM | POA: Diagnosis not present

## 2023-02-06 DIAGNOSIS — E785 Hyperlipidemia, unspecified: Secondary | ICD-10-CM

## 2023-02-06 DIAGNOSIS — I5032 Chronic diastolic (congestive) heart failure: Secondary | ICD-10-CM

## 2023-02-06 DIAGNOSIS — Z79899 Other long term (current) drug therapy: Secondary | ICD-10-CM

## 2023-02-06 LAB — BASIC METABOLIC PANEL
BUN/Creatinine Ratio: 22 (ref 12–28)
BUN: 21 mg/dL (ref 8–27)
CO2: 25 mmol/L (ref 20–29)
Calcium: 9.8 mg/dL (ref 8.7–10.3)
Chloride: 102 mmol/L (ref 96–106)
Creatinine, Ser: 0.96 mg/dL (ref 0.57–1.00)
Glucose: 132 mg/dL — ABNORMAL HIGH (ref 70–99)
Potassium: 4.7 mmol/L (ref 3.5–5.2)
Sodium: 139 mmol/L (ref 134–144)
eGFR: 61 mL/min/{1.73_m2} (ref >59)

## 2023-02-06 MED ORDER — EMPAGLIFLOZIN 10 MG PO TABS: 10.0000 mg | ORAL_TABLET | Freq: Every day | ORAL | 5 refills | Status: DC

## 2023-02-06 MED ORDER — AMLODIPINE BESYLATE 5 MG PO TABS: 5.0000 mg | ORAL_TABLET | Freq: Every day | ORAL | 5 refills | Status: DC

## 2023-02-06 NOTE — Patient Instructions (Addendum)
Medication Instructions/Medicamentos:  CHANGE Amlodipine to 5mg  daily [place]ia Amlodipine a 5mg  cada dia  CHECK if you are taking Chlorthalidone at home [place] por Chlorthalidone en su casa   Labwork/Laboratorios  Your physician recommends that you return for lab work today: [employer] Laboratorios hoy por favor: BMP   Testing/Procedures:  Your EKG today looked good! Su EKG era buena   Follow-Up / Tu proxima cita   14 de Noviembre 10:05AM

## 2023-02-06 NOTE — Progress Notes (Addendum)
Advanced Hypertension Clinic Assessment:    Date:  02/06/2023   ID:  Carolyn Little, DOB Jun 16, 1945, MRN [id number]  PCP:  Jackie Plum, MD  Cardiologist:  Donato Schultz, MD  Nephrologist:  Referring MD: Jackie Plum, MD   CC: Hypertension  History of Present Illness:    Carolyn Little is a 77 y.o. female with a hx of hypertension, hyperlipidemia, diabetes, CAD s/p PCI, diastolic heart failure here to follow up in the Advanced Hypertension Clinic.   Initially Miss hypertension is a 05/09/2022.  She had previously worked Dr. [name] and self discontinued valsartan/HCTZ and amlodipine.  ED visit 09/2021 with hypertension, headache.  Echo 12/13/2021 LVEF 70 to 75%.  Renal duplex 12/20/2021 negative for renal artery stenosis.  She was noted to have sleep apnea but not adherent to CPAP, encouraged to utilize regularly.  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  She was admitted 06/2022 for pulmonary edema.  Echo at that time LVEF 65% with mild LVH and indeterminate diastolic function.  She was diuresed and BP controlled in the hospital.  Last seen 01/02/2023 with initial BP 186/75 and repeat 176/84.  Home BP was labile as high as 180-200 and as low as 124-134 systolic.  She was no longer taking amlodipine.  As she was not using Lasix regularly it was discontinued and she was started on chlorthalidone 25 mg daily.  Carvedilol, spironolactone, irbesartan, Jardiance were continued.  Via subsequent labs due to creatinine 1.12, BUN 30 her Chlorthalidone was reduced to 12.5 mg daily. However she was unable to be reached with these recommendations.   Presents today for follow up with her daughter. Visit assisted by interpretor. Notes BP has been labile but no longer getting hypotensive readings. No recent lightlessness, chest pain, dyspnea. Exercising inconsistently her stretching exercises. She is concerned about taking generic medications and reassurance provided.   She is uncertain if she is taking chlorthalidone.  Does note she is taking amlodipine.  At home 130-150s in the morning (prior to medications) then increases throughout the day. Home wrist cuff found to be reasonably accurate for SBP (home cuff +4) but inaccurate for DBP (home cuff +16).   Manual: 192/80 Home wrist cuff: 196/96  Present regimen: Amlodipine - AM Carvedilol - AM and afternoon Chlorthalidone - not taking, daughter was under impression was discontinued Spironolactone - whole tablet daily Irbesartan - morning  Previous antihypertensives: Valsartan-HCTZ  Past Medical History:  Diagnosis Date   Arthritis    Dizziness    DM (diabetes mellitus) (HCC)    Generalized headaches    Hearing loss    High cholesterol    Hypertension    Low back pain    Obesity    OSA (obstructive sleep apnea) 06/18/2022   Renal insufficiency    Seizure disorder (HCC)     Past Surgical History:  Procedure Laterality Date   BACK SURGERY     BRAIN TUMOR EXCISION  1984   HAND SURGERY     LAPAROSCOPIC HYSTERECTOMY     SHOULDER SURGERY      Current Medications: Current Meds  Medication Sig   ACCU-CHEK GUIDE test strip as directed as directed   acetaminophen (TYLENOL) 325 MG tablet Take 325-650 mg by mouth every 6 (six) hours as needed.   amLODipine (NORVASC) 2.5 MG tablet Take 2.5 mg by mouth daily.   Blood Glucose Monitoring Suppl (ACCU-CHEK GUIDE) w/Device KIT    Blood Glucose Monitoring Suppl (GLUCOCOM BLOOD GLUCOSE MONITOR) DEVI 1  Device by Misc.(Non-Drug; Combo Route) route as needed (to check blood glucose).   Blood Glucose Monitoring Suppl (ONETOUCH VERIO FLEX SYSTEM) w/Device KIT 1 DEVICE BY MISC.(NON-DRUG COMBO ROUTE) ROUTE AS NEEDED (TO CHECK BLOOD GLUCOSE).   carvedilol (COREG) 25 MG tablet TAKE 1 TABLET (25 MG TOTAL) BY MOUTH TWICE A DAY WITH MEALS   chlorthalidone (HYGROTON) 25 MG tablet Take 1 tablet (25 mg total) by mouth daily.   Continuous Blood Gluc Receiver (DEXCOM G6  RECEIVER) DEVI Use as directed for continuous glucose monitoring.   Continuous Blood Gluc Sensor (DEXCOM G6 SENSOR) MISC Inject 1 sensor to the skin every 10 days for continuous glucose monitoring.   Continuous Blood Gluc Transmit (DEXCOM G6 TRANSMITTER) MISC Use as directed for continuous glucose monitoring. Reuse transmitter for 90 days then discard and replace.   CVS ASPIRIN ADULT LOW DOSE 81 MG chewable tablet Chew 81 mg by mouth daily.   diclofenac Sodium (VOLTAREN) 1 % GEL Apply 2 g topically 4 (four) times daily as needed.   Dulaglutide (TRULICITY) 3 MG/0.5ML SOPN Inject 3 mg into the skin every 7 (seven) days.   famotidine (PEPCID) 40 MG tablet Take 40 mg by mouth daily.   glucose blood (ONETOUCH VERIO) test strip To monitor sugar 4 times per day   Insulin Degludec (TRESIBA) 100 UNIT/ML SOLN Inject 20 Units into the skin every evening.   Insulin Pen Needle (BD PEN NEEDLE NANO U/F) 32G X 4 MM MISC Use once daily for [place] and once weekly for Trulicity   irbesartan (AVAPRO) 75 MG tablet Take 1 tablet (75 mg total) by mouth daily.   lamoTRIgine (LAMICTAL) 150 MG tablet Take 1 tablet (150 mg total) by mouth in the morning.   Lancets (ONETOUCH DELICA PLUS LANCET30G) MISC 1 DEVICE BY MISC.(NON-DRUG COMBO ROUTE) ROUTE 4 TIMES DAILY.   levETIRAcetam (KEPPRA) 500 MG tablet Take 1 tablet (500 mg total) by mouth every evening.   metFORMIN (GLUCOPHAGE) 500 MG tablet Take 500 mg by mouth 2 (two) times daily.   nitroGLYCERIN (NITROSTAT) 0.4 MG SL tablet Place 1 tablet (0.4 mg total) under the tongue every 5 (five) minutes as needed for chest pain.   ONETOUCH VERIO test strip 4 (four) times daily.   SIMBRINZA 1-0.2 % SUSP Apply 1 drop to eye 2 (two) times daily.   simvastatin (ZOCOR) 40 MG tablet Take 40 mg by mouth daily.   spironolactone (ALDACTONE) 25 MG tablet Take 1 tablet (25 mg total) by mouth daily.     Allergies:   Atorvastatin and Levaquin [levofloxacin]   Social History    Socioeconomic History   Marital status: Widowed    Spouse name: Not on file   Number of children: 4   Years of education: Not on file   Highest education level: Not on file  Occupational History   Not on file  Tobacco Use   Smoking status: Never   Smokeless tobacco: Never  Vaping Use   Vaping status: Never Used  Substance and Sexual Activity   Alcohol use: Not Currently   Drug use: Never   Sexual activity: Not on file  Other Topics Concern   Not on file  Social History Narrative   Lives alone   Social Determinants of Health   Financial Resource Strain: Not on file  Food Insecurity: No Food Insecurity (07/17/2022)   Hunger Vital Sign    Worried About Running Out of Food in the Last Year: Never true    Ran Out of Food in  the Last Year: Never true  Transportation Needs: No Transportation Needs (07/17/2022)   PRAPARE - Administrator, Civil Service (Medical): No    Lack of Transportation (Non-Medical): No  Physical Activity: Insufficiently Active (05/09/2022)   Exercise Vital Sign    Days of Exercise per Week: 7 days    Minutes of Exercise per Session: 20 min  Stress: Not on file  Social Connections: Not on file     Family History: The patient's family history is not on file.  ROS:   Please see the history of present illness.    All other systems reviewed and are negative.  EKGs/Labs/Other Studies Reviewed:    EKG Interpretation Date/Time:  Thursday February 06 2023 09:46:25 EDT Ventricular Rate:  70 PR Interval:  176 QRS Duration:  92 QT Interval:  404 QTC Calculation: 436 R Axis:   54  Text Interpretation: Normal sinus rhythm Stable TWI inferior-lateral leads. Stable compared to prior EKGs. Confirmed by [name] ([id number]) on 02/06/2023 9:52:26 AM    Recent Labs: 07/16/2022: ALT 15; B Natriuretic Peptide 513.6 07/18/2022: Hemoglobin 9.6; Platelets 266 01/13/2023: BUN 30; Creatinine, Ser 1.12; Potassium 5.1; Sodium 146   Recent Lipid  Panel No results found for: "CHOL", "TRIG", "HDL", "CHOLHDL", "VLDL", "LDLCALC", "LDLDIRECT"  Physical Exam:   VS:  BP (!) 192/80   Pulse 70   Ht 4\' 11"  (1.499 m)   Wt 153 lb (69.4 kg)   BMI 30.90 kg/m  , BMI Body mass index is 30.9 kg/m. GENERAL:  Well appearing, overweight HEENT: Pupils equal round and reactive, fundi not visualized, oral mucosa unremarkable NECK:  No jugular venous distention, waveform within normal limits, carotid upstroke brisk and symmetric, no bruits, no thyromegaly LYMPHATICS:  No cervical adenopathy LUNGS:  Clear to auscultation bilaterally HEART:  RRR.  PMI not displaced or sustained,S1 and S2 within normal limits, no S3, no S4, no clicks, no rubs, gr 1/6 systolic murmurs ABD:  Flat, positive bowel sounds normal in frequency in pitch, no bruits, no rebound, no guarding, no midline pulsatile mass, no hepatomegaly, no splenomegaly EXT:  2 plus pulses throughout, no edema, no cyanosis no clubbing SKIN:  No rashes no nodules NEURO:  Cranial nerves II through XII grossly intact, motor grossly intact throughout PSYCH:  Cognitively intact, oriented to person place and time   ASSESSMENT/PLAN:    Chronic diastolic heart failure- Euvolemic and well compensated on exam. Low sodium diet, fluid restriction <2L, and daily weights encouraged. Educated to contact our office for weight gain of 2 lbs overnight or 5 lbs in one week. Not requiring loop diuretic. Continue  Coreg, Jardiance. Refill provided.  BP management as below.  HTN- BP not at goal <130/80.  She is uncertain if she is taking chlorthalidone and will check her pill bottles at home.  BP consistently elevated at home.  Increase amlodipine from 2.5 to 5 mg daily.  Continue spironolactone 25 mg daily, irbesartan 75 mg daily, carvedilol 25 mg twice daily.  Will make careful changes given prior hypotension.  She has had no recent hypotensive episodes.  Home BP cuff which is wrist cuff found to read accurately for  systolic BP.  As creatinine mildly elevated on last labs we will repeat BMP today.  HLD - Continue Simvastatin.  OSA - CPAP compliance encouraged.   CAD s/p PCI- Stable with no anginal symptoms. No indication for ischemic evaluation.  GDMT Simvastatin, Carvedilol.  Screening for Secondary Hypertension:     05/09/2022  4:46 PM  Causes  Sleep Apnea Screened  Thyroid Disease Screened  Pheochromocytoma Screened  Cushing's Syndrome Screened  Coarctation of the Aorta Screened    Relevant Labs/Studies:    Latest Ref Rng & Units 01/13/2023   11:10 AM 07/18/2022   12:54 AM 07/17/2022    7:31 AM  Basic Labs  Sodium 134 - 144 mmol/L 146  136  139   Potassium 3.5 - 5.2 mmol/L 5.1  4.0  3.9   Creatinine 0.57 - 1.00 mg/dL 8.29  5.62  1.30              Latest Ref Rng & Units 05/15/2022   10:54 AM  Metanephrines/Catecholamines   Epinephrine 0 - 62 pg/mL 41   Norepinephrine 0 - 874 pg/mL 1,091   Dopamine 0 - 48 pg/mL 32   Metanephrines 0.0 - 88.0 pg/mL 45.0   Normetanephrines  0.0 - 285.2 pg/mL 130.0           12/20/2021   10:26 AM  Renovascular   Renal Artery [place] Completed Yes     Disposition:    FU with MD/PharmD in 2 months    Medication Adjustments/Labs and Tests Ordered: Current medicines are reviewed at length with the patient today.  Concerns regarding medicines are outlined above.  Orders Placed This Encounter  Procedures   EKG 12-Lead   No orders of the defined types were placed in this encounter.   Signed, Alver Sorrow, NP  02/06/2023 9:59 AM    [facility]

## 2023-02-12 ENCOUNTER — Other Ambulatory Visit: Payer: Self-pay

## 2023-02-12 ENCOUNTER — Other Ambulatory Visit (HOSPITAL_COMMUNITY): Payer: Self-pay

## 2023-02-18 ENCOUNTER — Telehealth (HOSPITAL_BASED_OUTPATIENT_CLINIC_OR_DEPARTMENT_OTHER): Payer: Self-pay

## 2023-02-18 DIAGNOSIS — I10 Essential (primary) hypertension: Secondary | ICD-10-CM

## 2023-02-18 MED ORDER — CHLORTHALIDONE 25 MG PO TABS: 12.5000 mg | ORAL_TABLET | Freq: Every day | ORAL | 3 refills | Status: DC

## 2023-02-18 NOTE — Telephone Encounter (Addendum)
2nd call attempt to patient's daughter, Results called to patient who verbalizes understanding! She did stop the chlorthalidone due to low blood pressures in the 80s. Since stopping medications blood pressure running 160-180s.    ----- Message from Alver Sorrow sent at 02/07/2023 11:31 AM EDT ----- Normal kidney function and electrolytes. Good result!   Needs call with interpretor or can speak with daughter. When we call, can we confirm if she is taking Chlorthalidone? At office visit 02/07/23 she was going to let us know if she was taking.

## 2023-02-18 NOTE — Telephone Encounter (Signed)
Unusual for any BP medication to take SBP from 80s to 160-180s. Please confirm she is taking Amlodipine 5mg , Spironolactone 25mg , irbesartan 75mg  all once daily and Carvedilol 25mg  BID.   If taking above medications as prescribed and BP at home persistently 160-180s recommend resuming Chlorthalidone at 12.5mg  daily (half tablet) with repeat BMP in 1 week.   Alver Sorrow, NP

## 2023-02-18 NOTE — Telephone Encounter (Signed)
Returned call to patient's daughter (ok per DPR), reviewed recommendations, rx will be restarted and labs ordered.     "Unusual for any BP medication to take SBP from 80s to 160-180s. Please confirm she is taking Amlodipine 5mg , Spironolactone 25mg , irbesartan 75mg  all once daily and Carvedilol 25mg  BID.    If taking above medications as prescribed and BP at home persistently 160-180s recommend resuming Chlorthalidone at 12.5mg  daily (half tablet) with repeat BMP in 1 week.    Alver Sorrow, NP

## 2023-02-18 NOTE — Addendum Note (Signed)
Addended by: Marlene Lard on: 02/18/2023 01:26 PM   Modules accepted: Orders

## 2023-03-11 ENCOUNTER — Other Ambulatory Visit: Payer: Self-pay | Admitting: Cardiology

## 2023-03-14 DIAGNOSIS — I10 Essential (primary) hypertension: Secondary | ICD-10-CM | POA: Diagnosis not present

## 2023-03-14 DIAGNOSIS — R011 Cardiac murmur, unspecified: Secondary | ICD-10-CM | POA: Diagnosis not present

## 2023-03-14 DIAGNOSIS — E782 Mixed hyperlipidemia: Secondary | ICD-10-CM | POA: Diagnosis not present

## 2023-03-14 DIAGNOSIS — Z794 Long term (current) use of insulin: Secondary | ICD-10-CM | POA: Diagnosis not present

## 2023-03-14 DIAGNOSIS — E559 Vitamin D deficiency, unspecified: Secondary | ICD-10-CM | POA: Diagnosis not present

## 2023-03-14 DIAGNOSIS — E1165 Type 2 diabetes mellitus with hyperglycemia: Secondary | ICD-10-CM | POA: Diagnosis not present

## 2023-03-14 DIAGNOSIS — K219 Gastro-esophageal reflux disease without esophagitis: Secondary | ICD-10-CM | POA: Diagnosis not present

## 2023-03-14 DIAGNOSIS — M8589 Other specified disorders of bone density and structure, multiple sites: Secondary | ICD-10-CM | POA: Diagnosis not present

## 2023-03-14 DIAGNOSIS — M1990 Unspecified osteoarthritis, unspecified site: Secondary | ICD-10-CM | POA: Diagnosis not present

## 2023-03-21 DIAGNOSIS — M1990 Unspecified osteoarthritis, unspecified site: Secondary | ICD-10-CM | POA: Diagnosis not present

## 2023-03-21 DIAGNOSIS — E1165 Type 2 diabetes mellitus with hyperglycemia: Secondary | ICD-10-CM | POA: Diagnosis not present

## 2023-03-21 DIAGNOSIS — Z794 Long term (current) use of insulin: Secondary | ICD-10-CM | POA: Diagnosis not present

## 2023-03-21 DIAGNOSIS — M8589 Other specified disorders of bone density and structure, multiple sites: Secondary | ICD-10-CM | POA: Diagnosis not present

## 2023-03-21 DIAGNOSIS — R011 Cardiac murmur, unspecified: Secondary | ICD-10-CM | POA: Diagnosis not present

## 2023-03-21 DIAGNOSIS — E782 Mixed hyperlipidemia: Secondary | ICD-10-CM | POA: Diagnosis not present

## 2023-03-21 DIAGNOSIS — K219 Gastro-esophageal reflux disease without esophagitis: Secondary | ICD-10-CM | POA: Diagnosis not present

## 2023-03-21 DIAGNOSIS — E559 Vitamin D deficiency, unspecified: Secondary | ICD-10-CM | POA: Diagnosis not present

## 2023-03-21 DIAGNOSIS — I10 Essential (primary) hypertension: Secondary | ICD-10-CM | POA: Diagnosis not present

## 2023-03-21 DIAGNOSIS — Z0000 Encounter for general adult medical examination without abnormal findings: Secondary | ICD-10-CM | POA: Diagnosis not present

## 2023-03-31 ENCOUNTER — Telehealth (HOSPITAL_BASED_OUTPATIENT_CLINIC_OR_DEPARTMENT_OTHER): Payer: Self-pay | Admitting: Family

## 2023-03-31 NOTE — Telephone Encounter (Signed)
Good morning,  I called patient to schedule her for an echo for January. Patient stated that she will not be back in the country until June 09, 2023 Could we extend the Echo? I can get her schedule for January 17 th at 11 am.   Thanks Charpelle

## 2023-04-10 ENCOUNTER — Ambulatory Visit (HOSPITAL_BASED_OUTPATIENT_CLINIC_OR_DEPARTMENT_OTHER): Payer: 59 | Admitting: Family

## 2023-04-10 ENCOUNTER — Encounter (HOSPITAL_BASED_OUTPATIENT_CLINIC_OR_DEPARTMENT_OTHER): Payer: Self-pay | Admitting: Family

## 2023-04-10 VITALS — BP 160/82 | HR 64 | Ht 59.0 in | Wt 154.5 lb

## 2023-04-10 DIAGNOSIS — E785 Hyperlipidemia, unspecified: Secondary | ICD-10-CM | POA: Diagnosis not present

## 2023-04-10 DIAGNOSIS — G4733 Obstructive sleep apnea (adult) (pediatric): Secondary | ICD-10-CM

## 2023-04-10 DIAGNOSIS — I5032 Chronic diastolic (congestive) heart failure: Secondary | ICD-10-CM | POA: Diagnosis not present

## 2023-04-10 DIAGNOSIS — I10 Essential (primary) hypertension: Secondary | ICD-10-CM | POA: Diagnosis not present

## 2023-04-10 DIAGNOSIS — I25118 Atherosclerotic heart disease of native coronary artery with other forms of angina pectoris: Secondary | ICD-10-CM

## 2023-04-10 LAB — BASIC METABOLIC PANEL
BUN/Creatinine Ratio: 19 (ref 12–28)
BUN: 16 mg/dL (ref 8–27)
CO2: 23 mmol/L (ref 20–29)
Calcium: 10 mg/dL (ref 8.7–10.3)
Chloride: 104 mmol/L (ref 96–106)
Creatinine, Ser: 0.86 mg/dL (ref 0.57–1.00)
Glucose: 193 mg/dL — ABNORMAL HIGH (ref 70–99)
Potassium: 4.9 mmol/L (ref 3.5–5.2)
Sodium: 145 mmol/L — ABNORMAL HIGH (ref 134–144)
eGFR: 70 mL/min/{1.73_m2} (ref >59)

## 2023-04-10 NOTE — Progress Notes (Signed)
Advanced Hypertension Clinic Assessment:    Date:  04/10/2023   ID:  Carolyn Little, DOB 11/26/1945, MRN [id number]  PCP:  Jackie Plum, MD  Cardiologist:  Donato Schultz, MD  Nephrologist:  Referring MD: Jackie Plum, MD   CC: Hypertension  History of Present Illness:    Carolyn Little is a 77 y.o. female with a hx of hypertension, hyperlipidemia, diabetes, CAD s/p PCI, diastolic heart failure here to follow up in the [facility].   Initially Miss hypertension is a 05/09/2022.  She had previously worked Dr. [name] and self discontinued valsartan/HCTZ and amlodipine.  ED visit 09/2021 with hypertension, headache.  Echo 12/13/2021 LVEF 70 to 75%.  Renal duplex 12/20/2021 negative for renal artery stenosis.  She was noted to have sleep apnea but not adherent to CPAP, encouraged to utilize regularly.  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  She was admitted 06/2022 for pulmonary edema.  Echo at that time LVEF 65% with mild LVH and indeterminate diastolic function.  She was diuresed and BP controlled in the hospital.  Last seen 01/02/2023 with initial BP 186/75 and repeat 176/84.  Home BP was labile as high as 180-200 and as low as 124-134 systolic.  She was no longer taking amlodipine.  As she was not using Lasix regularly it was discontinued and she was started on chlorthalidone 25 mg daily.  Carvedilol, spironolactone, irbesartan, Jardiance were continued.  Via subsequent labs due to creatinine 1.12, BUN 30 her Chlorthalidone was reduced to 12.5 mg daily. However she was unable to be reached with these recommendations.   At visit 02/06/23 she was euvolemic, Amlodipine increased to 5mg  daily. Spironolactone 25mg  daily, Irbesartan 75 mg daily, Coreg 25mg  BID were  continued. She was uncertain if she was taking Chlorthalidone. Via follow up phone call Chlorthalidone 12.5mg  daily was resumed. Home wrist cuff found to read accurately for  systolic (+4) but inaccurate diastolic (+16).    Presents today for follow up. Visit assisted by interpretor.  Has not had repeat BMP since resuming half tablet of chlorthalidone, will collect today.  BP at home has been overall labile.  Readings as low as 123 last night but as high as 150s.  It appears her average based on her recollection is 130s-140s.  She notes she has started walking for exercise and is feeling much better since her last office visit.  Manual: 192/80 Home wrist cuff: 196/96  Present regimen: Amlodipine - AM Carvedilol - AM and afternoon Chlorthalidone - not taking, daughter was under impression was discontinued Spironolactone - whole tablet daily Irbesartan - morning  Previous antihypertensives: Valsartan-HCTZ  Past Medical History:  Diagnosis Date   Arthritis    Dizziness    DM (diabetes mellitus) (HCC)    Generalized headaches    Hearing loss    High cholesterol    Hypertension    Low back pain    Obesity    OSA (obstructive sleep apnea) 06/18/2022   Renal insufficiency    Seizure disorder (HCC)     Past Surgical History:  Procedure Laterality Date   BACK SURGERY     BRAIN TUMOR EXCISION  1984   HAND SURGERY     LAPAROSCOPIC HYSTERECTOMY     SHOULDER SURGERY      Current Medications: Current Meds  Medication Sig   ACCU-CHEK GUIDE test strip as directed as directed   acetaminophen (TYLENOL) 325 MG tablet Take 325-650 mg by mouth every 6 (six) hours  as needed.   amLODipine (NORVASC) 5 MG tablet Take 1 tablet (5 mg total) by mouth daily.   Blood Glucose Monitoring Suppl (ACCU-CHEK GUIDE) w/Device KIT    Blood Glucose Monitoring Suppl (GLUCOCOM BLOOD GLUCOSE MONITOR) DEVI 1 Device by Misc.(Non-Drug; Combo Route) route as needed (to check blood glucose).   Blood Glucose Monitoring Suppl (ONETOUCH VERIO FLEX SYSTEM) w/Device KIT 1 DEVICE BY MISC.(NON-DRUG COMBO ROUTE) ROUTE AS NEEDED (TO CHECK BLOOD GLUCOSE).   carvedilol (COREG) 25 MG tablet TAKE  1 TABLET (25 MG TOTAL) BY MOUTH TWICE A DAY WITH MEALS   chlorthalidone (HYGROTON) 25 MG tablet Take 0.5 tablets (12.5 mg total) by mouth daily.   Continuous Blood Gluc Receiver (DEXCOM G6 RECEIVER) DEVI Use as directed for continuous glucose monitoring.   Continuous Blood Gluc Sensor (DEXCOM G6 SENSOR) MISC Inject 1 sensor to the skin every 10 days for continuous glucose monitoring.   Continuous Blood Gluc Transmit (DEXCOM G6 TRANSMITTER) MISC Use as directed for continuous glucose monitoring. Reuse transmitter for 90 days then discard and replace.   [employer] ASPIRIN ADULT LOW DOSE 81 MG chewable tablet Chew 81 mg by mouth daily.   diclofenac Sodium (VOLTAREN) 1 % GEL Apply 2 g topically 4 (four) times daily as needed.   Dulaglutide (TRULICITY) 3 MG/0.5ML SOPN Inject 3 mg into the skin every 7 (seven) days.   famotidine (PEPCID) 40 MG tablet Take 40 mg by mouth daily.   glucose blood (ONETOUCH VERIO) test strip To monitor sugar 4 times per day   Insulin Degludec (TRESIBA) 100 UNIT/ML SOLN Inject 20 Units into the skin every evening.   Insulin Pen Needle (BD PEN NEEDLE NANO U/F) 32G X 4 MM MISC Use once daily for [place] and once weekly for Trulicity   irbesartan (AVAPRO) 75 MG tablet Take 1 tablet (75 mg total) by mouth daily.   lamoTRIgine (LAMICTAL) 150 MG tablet Take 1 tablet (150 mg total) by mouth in the morning.   Lancets (ONETOUCH DELICA PLUS LANCET30G) MISC 1 DEVICE BY MISC.(NON-DRUG COMBO ROUTE) ROUTE 4 TIMES DAILY.   levETIRAcetam (KEPPRA) 500 MG tablet Take 1 tablet (500 mg total) by mouth every evening.   metFORMIN (GLUCOPHAGE) 500 MG tablet Take 500 mg by mouth 2 (two) times daily.   ONETOUCH VERIO test strip 4 (four) times daily.   SIMBRINZA 1-0.2 % SUSP Apply 1 drop to eye 2 (two) times daily.   simvastatin (ZOCOR) 40 MG tablet Take 40 mg by mouth daily.   spironolactone (ALDACTONE) 25 MG tablet Take 1 tablet (25 mg total) by mouth daily.     Allergies:   Atorvastatin and Levaquin  [levofloxacin]   Social History   Socioeconomic History   Marital status: Widowed    Spouse name: Not on file   Number of children: 4   Years of education: Not on file   Highest education level: Not on file  Occupational History   Not on file  Tobacco Use   Smoking status: Never   Smokeless tobacco: Never  Vaping Use   Vaping status: Never Used  Substance and Sexual Activity   Alcohol use: Not Currently   Drug use: Never   Sexual activity: Not on file  Other Topics Concern   Not on file  Social History Narrative   Lives alone   Social Determinants of Health   Financial Resource Strain: Not on file  Food Insecurity: No Food Insecurity (07/17/2022)   Hunger Vital Sign    Worried About Running Out of  Food in the Last Year: Never true    Ran Out of Food in the Last Year: Never true  Transportation Needs: No Transportation Needs (07/17/2022)   PRAPARE - Administrator, Civil Service (Medical): No    Lack of Transportation (Non-Medical): No  Physical Activity: Insufficiently Active (05/09/2022)   Exercise Vital Sign    Days of Exercise per Week: 7 days    Minutes of Exercise per Session: 20 min  Stress: Not on file  Social Connections: Not on file     Family History: The patient's family history is not on file.  ROS:   Please see the history of present illness.    All other systems reviewed and are negative.  EKGs/Labs/Other Studies Reviewed:         Recent Labs: 07/16/2022: ALT 15; B Natriuretic Peptide 513.6 07/18/2022: Hemoglobin 9.6; Platelets 266 02/06/2023: BUN 21; Creatinine, Ser 0.96; Potassium 4.7; Sodium 139   Recent Lipid Panel No results found for: "CHOL", "TRIG", "HDL", "CHOLHDL", "VLDL", "LDLCALC", "LDLDIRECT"  Physical Exam:   VS:  BP (!) 146/62   Pulse 64   Ht 4\' 11"  (1.499 m)   Wt 154 lb 8 oz (70.1 kg)   BMI 31.21 kg/m  , BMI Body mass index is 31.21 kg/m.  Vitals:   04/10/23 1042 04/10/23 1045  BP: (!) 146/62 (!) 160/82   Pulse: 64   Height: 4\' 11"  (1.499 m)   Weight: 154 lb 8 oz (70.1 kg)   SpO2: Comment: unable to obtain O2 due to nails   BMI (Calculated): 31.19      GENERAL:  Well appearing, overweight HEENT: Pupils equal round and reactive, fundi not visualized, oral mucosa unremarkable. 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  NECK:  No jugular venous distention, waveform within normal limits, carotid upstroke brisk and symmetric, no bruits, no thyromegaly LYMPHATICS:  No cervical adenopathy LUNGS:  Clear to auscultation bilaterally HEART:  RRR.  PMI not displaced or sustained,S1 and S2 within normal limits, no S3, no S4, no clicks, no rubs, gr 1/6 systolic murmurs ABD:  Flat, positive bowel sounds normal in frequency in pitch, no bruits, no rebound, no guarding, no midline pulsatile mass, no hepatomegaly, no splenomegaly EXT:  2 plus pulses throughout, no edema, no cyanosis no clubbing SKIN:  No rashes no nodules NEURO:  Cranial nerves II through XII grossly intact, motor grossly intact throughout PSYCH:  Cognitively intact, oriented to person place and time   ASSESSMENT/PLAN:    Chronic diastolic heart failure- Euvolemic and well compensated on exam. Low sodium diet, fluid restriction <2L, and daily weights encouraged. Educated to contact our office for weight gain of 2 lbs overnight or 5 lbs in one week. Not requiring loop diuretic. Continue  Coreg, Jardiance.  BP management as below.  HTN- BP elevated in clinic.  Labile at home with systolic as low as 123 and as high as 150s.  Appears her averages 130s/140s based on her recollection.  No specific readings brought to clinic today.  Since her last office visit we resumed chlorthalidone but she has not yet had repeat BMP.  Update BMP today. If stable kidney function, consider increasing to Chlorthalidone 25mg  daily. If kidney function not stable, plan to increase Amlodipine to 10mg .   HLD - Continue Simvastatin.  OSA -  CPAP compliance encouraged.   CAD s/p PCI- Stable with no anginal symptoms. No indication for ischemic evaluation.  GDMT Simvastatin, Carvedilol.  Screening for Secondary  Hypertension:     05/09/2022    4:46 PM  Causes  Sleep Apnea Screened  Thyroid Disease Screened  Pheochromocytoma Screened  Cushing's Syndrome Screened  Coarctation of the Aorta Screened    Relevant Labs/Studies:    Latest Ref Rng & Units 02/06/2023   10:47 AM 01/13/2023   11:10 AM 07/18/2022   12:54 AM  Basic Labs  Sodium 134 - 144 mmol/L 139  146  136   Potassium 3.5 - 5.2 mmol/L 4.7  5.1  4.0   Creatinine 0.57 - 1.00 mg/dL 1.61  0.96  0.45              Latest Ref Rng & Units 05/15/2022   10:54 AM  Metanephrines/Catecholamines   Epinephrine 0 - 62 pg/mL 41   Norepinephrine 0 - 874 pg/mL 1,091   Dopamine 0 - 48 pg/mL 32   Metanephrines 0.0 - 88.0 pg/mL 45.0   Normetanephrines  0.0 - 285.2 pg/mL 130.0           12/20/2021   10:26 AM  Renovascular   Renal Artery [place] Completed Yes     Disposition:    FU with MD/PharmD in 2 months    Medication Adjustments/Labs and Tests Ordered: Current medicines are reviewed at length with the patient today.  Concerns regarding medicines are outlined above.  No orders of the defined types were placed in this encounter.  No orders of the defined types were placed in this encounter.   Signed, Alver Sorrow, NP  04/10/2023 2:01 PM    [facility]

## 2023-04-10 NOTE — Patient Instructions (Signed)
Medication Instructions:  Continue your current medications Continua la misa medicamentos.  We may change your medicines based on lab work. Es possible nosotros cambiamos medicamentos cuango nosotros tenemos laboratorios.    Labwork: Your physician recommends that you return for lab work today: BMP Laboratorios hoy para BMP - mira los rellenos    Follow-Up: In 6-8 weeks with Hypertension Clinic  En 6-8 semanas con Hypertension Clinic

## 2023-04-11 ENCOUNTER — Telehealth (HOSPITAL_BASED_OUTPATIENT_CLINIC_OR_DEPARTMENT_OTHER): Payer: Self-pay

## 2023-04-11 DIAGNOSIS — I10 Essential (primary) hypertension: Secondary | ICD-10-CM

## 2023-04-11 MED ORDER — AMLODIPINE BESYLATE 10 MG PO TABS: 10.0000 mg | ORAL_TABLET | Freq: Every day | ORAL | 3 refills | Status: DC

## 2023-04-11 NOTE — Telephone Encounter (Signed)
Called interpreter - connected to Milagros (ID# [id number]), Patient was called and discussed lab results and medication recommendation. Patient verbalized understanding. Medication sent to preferred pharmacy.

## 2023-04-11 NOTE — Telephone Encounter (Signed)
-----   Message from Alver Sorrow sent at 04/11/2023  7:35 AM EST ----- Stable kidney function. Sodium mildly elevated, not of concern. As BP not routinely at goal <130/80, increase Amlodipine to 10mg  daily.

## 2023-04-30 ENCOUNTER — Other Ambulatory Visit (HOSPITAL_COMMUNITY): Payer: Self-pay

## 2023-05-01 DIAGNOSIS — Z1211 Encounter for screening for malignant neoplasm of colon: Secondary | ICD-10-CM | POA: Diagnosis not present

## 2023-05-07 ENCOUNTER — Ambulatory Visit (HOSPITAL_BASED_OUTPATIENT_CLINIC_OR_DEPARTMENT_OTHER): Payer: 59 | Admitting: Cardiovascular Disease

## 2023-05-13 DIAGNOSIS — H43392 Other vitreous opacities, left eye: Secondary | ICD-10-CM | POA: Diagnosis not present

## 2023-05-13 DIAGNOSIS — H43813 Vitreous degeneration, bilateral: Secondary | ICD-10-CM | POA: Diagnosis not present

## 2023-05-13 DIAGNOSIS — E113393 Type 2 diabetes mellitus with moderate nonproliferative diabetic retinopathy without macular edema, bilateral: Secondary | ICD-10-CM | POA: Diagnosis not present

## 2023-05-13 DIAGNOSIS — H401134 Primary open-angle glaucoma, bilateral, indeterminate stage: Secondary | ICD-10-CM | POA: Diagnosis not present

## 2023-05-13 DIAGNOSIS — H35373 Puckering of macula, bilateral: Secondary | ICD-10-CM | POA: Diagnosis not present

## 2023-05-14 ENCOUNTER — Other Ambulatory Visit: Payer: Self-pay | Admitting: Cardiology

## 2023-05-15 DIAGNOSIS — E559 Vitamin D deficiency, unspecified: Secondary | ICD-10-CM | POA: Diagnosis not present

## 2023-05-15 DIAGNOSIS — Z794 Long term (current) use of insulin: Secondary | ICD-10-CM | POA: Diagnosis not present

## 2023-05-15 DIAGNOSIS — R011 Cardiac murmur, unspecified: Secondary | ICD-10-CM | POA: Diagnosis not present

## 2023-05-15 DIAGNOSIS — K219 Gastro-esophageal reflux disease without esophagitis: Secondary | ICD-10-CM | POA: Diagnosis not present

## 2023-05-15 DIAGNOSIS — E1165 Type 2 diabetes mellitus with hyperglycemia: Secondary | ICD-10-CM | POA: Diagnosis not present

## 2023-05-15 DIAGNOSIS — I10 Essential (primary) hypertension: Secondary | ICD-10-CM | POA: Diagnosis not present

## 2023-05-15 DIAGNOSIS — M8589 Other specified disorders of bone density and structure, multiple sites: Secondary | ICD-10-CM | POA: Diagnosis not present

## 2023-05-15 DIAGNOSIS — E782 Mixed hyperlipidemia: Secondary | ICD-10-CM | POA: Diagnosis not present

## 2023-05-15 DIAGNOSIS — M1990 Unspecified osteoarthritis, unspecified site: Secondary | ICD-10-CM | POA: Diagnosis not present

## 2023-06-13 ENCOUNTER — Encounter (HOSPITAL_BASED_OUTPATIENT_CLINIC_OR_DEPARTMENT_OTHER): Payer: 59

## 2023-06-16 ENCOUNTER — Encounter (HOSPITAL_BASED_OUTPATIENT_CLINIC_OR_DEPARTMENT_OTHER): Payer: 59

## 2023-06-19 ENCOUNTER — Encounter (HOSPITAL_BASED_OUTPATIENT_CLINIC_OR_DEPARTMENT_OTHER): Payer: 59 | Admitting: Family

## 2023-06-23 ENCOUNTER — Encounter (HOSPITAL_BASED_OUTPATIENT_CLINIC_OR_DEPARTMENT_OTHER): Payer: 59 | Admitting: Cardiovascular Disease

## 2023-06-29 ENCOUNTER — Other Ambulatory Visit: Payer: Self-pay | Admitting: Cardiology

## 2023-07-01 ENCOUNTER — Other Ambulatory Visit: Payer: Self-pay | Admitting: Cardiology

## 2023-07-01 ENCOUNTER — Telehealth: Payer: Self-pay | Admitting: Cardiology

## 2023-07-01 NOTE — Telephone Encounter (Signed)
Spoke with pt daughter, cardiac medications discussed.

## 2023-07-01 NOTE — Telephone Encounter (Signed)
Pt is at the ED now in [place] and daughter requesting cb to review medications

## 2023-07-04 ENCOUNTER — Telehealth: Payer: Self-pay | Admitting: Family

## 2023-07-04 NOTE — Telephone Encounter (Signed)
 Order extended until 08/2023.

## 2023-07-04 NOTE — Telephone Encounter (Signed)
 Daughter called to r/s Carotid Duplex because [place] is out of the country. She wants to r/s to sometime in March but that is past the orders expiration date. Please adjust expiration date for Carotid Duplex to be r/s. Thanks for your help

## 2023-07-07 ENCOUNTER — Encounter (HOSPITAL_BASED_OUTPATIENT_CLINIC_OR_DEPARTMENT_OTHER): Payer: 59

## 2023-07-07 ENCOUNTER — Other Ambulatory Visit: Payer: Self-pay | Admitting: Diagnostic Neuroimaging

## 2023-07-07 NOTE — Telephone Encounter (Signed)
 Last seen on 01/13/23 per note " continue lamotrigine  150mg  in AM " Follow up scheduled on 01/13/24

## 2023-07-17 ENCOUNTER — Other Ambulatory Visit (HOSPITAL_BASED_OUTPATIENT_CLINIC_OR_DEPARTMENT_OTHER): Payer: Self-pay | Admitting: Cardiovascular Disease

## 2023-07-29 ENCOUNTER — Other Ambulatory Visit (HOSPITAL_BASED_OUTPATIENT_CLINIC_OR_DEPARTMENT_OTHER): Payer: Self-pay | Admitting: Family

## 2023-07-29 DIAGNOSIS — I10 Essential (primary) hypertension: Secondary | ICD-10-CM

## 2023-07-30 ENCOUNTER — Other Ambulatory Visit (HOSPITAL_BASED_OUTPATIENT_CLINIC_OR_DEPARTMENT_OTHER): Payer: Self-pay | Admitting: Cardiovascular Disease

## 2023-08-05 DIAGNOSIS — E559 Vitamin D deficiency, unspecified: Secondary | ICD-10-CM | POA: Diagnosis not present

## 2023-08-05 DIAGNOSIS — Z794 Long term (current) use of insulin: Secondary | ICD-10-CM | POA: Diagnosis not present

## 2023-08-05 DIAGNOSIS — K219 Gastro-esophageal reflux disease without esophagitis: Secondary | ICD-10-CM | POA: Diagnosis not present

## 2023-08-05 DIAGNOSIS — R011 Cardiac murmur, unspecified: Secondary | ICD-10-CM | POA: Diagnosis not present

## 2023-08-05 DIAGNOSIS — E1165 Type 2 diabetes mellitus with hyperglycemia: Secondary | ICD-10-CM | POA: Diagnosis not present

## 2023-08-05 DIAGNOSIS — I10 Essential (primary) hypertension: Secondary | ICD-10-CM | POA: Diagnosis not present

## 2023-08-05 DIAGNOSIS — M1990 Unspecified osteoarthritis, unspecified site: Secondary | ICD-10-CM | POA: Diagnosis not present

## 2023-08-05 DIAGNOSIS — M8589 Other specified disorders of bone density and structure, multiple sites: Secondary | ICD-10-CM | POA: Diagnosis not present

## 2023-08-05 DIAGNOSIS — E782 Mixed hyperlipidemia: Secondary | ICD-10-CM | POA: Diagnosis not present

## 2023-08-07 ENCOUNTER — Ambulatory Visit (INDEPENDENT_AMBULATORY_CARE_PROVIDER_SITE_OTHER): Payer: 59 | Admitting: Family

## 2023-08-07 ENCOUNTER — Encounter (HOSPITAL_BASED_OUTPATIENT_CLINIC_OR_DEPARTMENT_OTHER): Payer: Self-pay | Admitting: Family

## 2023-08-07 VITALS — BP 197/73 | HR 72 | Ht 59.0 in | Wt 151.0 lb

## 2023-08-07 DIAGNOSIS — I5032 Chronic diastolic (congestive) heart failure: Secondary | ICD-10-CM | POA: Diagnosis not present

## 2023-08-07 DIAGNOSIS — I10 Essential (primary) hypertension: Secondary | ICD-10-CM

## 2023-08-07 DIAGNOSIS — E785 Hyperlipidemia, unspecified: Secondary | ICD-10-CM | POA: Diagnosis not present

## 2023-08-07 DIAGNOSIS — I25118 Atherosclerotic heart disease of native coronary artery with other forms of angina pectoris: Secondary | ICD-10-CM

## 2023-08-07 MED ORDER — IRBESARTAN 150 MG PO TABS: 150.0000 mg | ORAL_TABLET | Freq: Every day | ORAL | 3 refills | Status: DC

## 2023-08-07 NOTE — Progress Notes (Signed)
 Advanced Hypertension Clinic Assessment:    Date:  08/07/2023   ID:  Horald Chestnut Little, DOB 06-08-1945, MRN [id number]  PCP:  Jackie Plum, MD  Cardiologist:  Donato Schultz, MD  Nephrologist:  Referring MD: Jackie Plum, MD   CC: Hypertension  History of Present Illness:    Carolyn Little is a 78 y.o. female with a hx of hypertension, hyperlipidemia, diabetes, CAD s/p PCI, diastolic heart failure here to follow up in the Advanced Hypertension Clinic.   Initially established with Advanced Hypertension Clinic 05/09/2022.  She had previously worked Dr. [name] and self discontinued valsartan/HCTZ and amlodipine.  Echo 12/13/2021 LVEF 70 to 75%.  Renal duplex 12/20/2021 negative for renal artery stenosis.  She was noted to have sleep apnea but not adherent to CPAP, encouraged to utilize regularly.  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  She was admitted 06/2022 for pulmonary edema.  Echo at that time LVEF 65% with mild LVH and indeterminate diastolic function.  She was diuresed and BP controlled in the hospital.  Seen 01/02/2023 with initial BP 186/75 and repeat 176/84. She was no longer taking amlodipine.  As she was not using Lasix regularly it was discontinued and she was started on chlorthalidone 25 mg daily.  Carvedilol, spironolactone, irbesartan, Jardiance were continued.  Via subsequent labs due to creatinine 1.12, BUN 30 her Chlorthalidone was reduced to 12.5 mg daily.   At visit 02/06/23 she was euvolemic, Amlodipine increased to 5mg  daily. Spironolactone 25mg  daily, Irbesartan 75 mg daily, Coreg 25mg  BID, Chlorthalidone 12.5mg  daily  were  continued. Home wrist cuff found to read accurately for systolic (+4) but inaccurate diastolic (+16).    Last seen 04/10/2023, amlodipine increased to 10 mg daily.  Presents today for follow up with her daughter. Visit assisted by interpretor.  BP at home persistently elevated. In the morning readings such as  142/69. She recently was in [place] to visit family and sadly her sister passed away, offered condolences. While in [place] BP 160-200s. She did go to the ED there for elevated BP and LE edema which improved with IV Lasix.  She did note dietary indiscretion we discussed increase stressors as well as increased sodium intake could contribute to elevated BP and fluid retention.  Plans to start walking for exercise as the weather warms up.  Present regimen: Amlodipine - AM Carvedilol - AM and afternoon Chlorthalidone - not taking, daughter was under impression was discontinued Spironolactone - whole tablet daily Irbesartan - morning  Previous antihypertensives: Valsartan-HCTZ  Past Medical History:  Diagnosis Date   Arthritis    Dizziness    DM (diabetes mellitus) (HCC)    Generalized headaches    Hearing loss    High cholesterol    Hypertension    Low back pain    Obesity    OSA (obstructive sleep apnea) 06/18/2022   Renal insufficiency    Seizure disorder (HCC)     Past Surgical History:  Procedure Laterality Date   BACK SURGERY     BRAIN TUMOR EXCISION  1984   HAND SURGERY     LAPAROSCOPIC HYSTERECTOMY     SHOULDER SURGERY      Current Medications: No outpatient medications have been marked as taking for the 08/07/23 encounter (Appointment) with Alver Sorrow, NP.     Allergies:   Atorvastatin and Levaquin [levofloxacin]   Social History   Socioeconomic History   Marital status: Widowed    Spouse name: Not  on file   Number of children: 4   Years of education: Not on file   Highest education level: Not on file  Occupational History   Not on file  Tobacco Use   Smoking status: Never   Smokeless tobacco: Never  Vaping Use   Vaping status: Never Used  Substance and Sexual Activity   Alcohol use: Not Currently   Drug use: Never   Sexual activity: Not on file  Other Topics Concern   Not on file  Social History Narrative   Lives alone   Social  Drivers of Health   Financial Resource Strain: Low Risk  (04/30/2023)   Received from [facility]   Overall Financial Resource Strain (CARDIA)    Difficulty of Paying Living Expenses: Not very hard  Food Insecurity: Food Insecurity Present (04/30/2023)   Received from [facility]   Hunger Vital Sign    Worried About Running Out of Food in the Last Year: Sometimes true    Ran Out of Food in the Last Year: Never true  Transportation Needs: No Transportation Needs (04/30/2023)   Received from [facility] - Transportation    Lack of Transportation (Medical): No    Lack of Transportation (Non-Medical): No  Physical Activity: Unknown (04/30/2023)   Received from [facility]   Exercise Vital Sign    Days of Exercise per Week: 0 days    Minutes of Exercise per Session: Not on file  Stress: No Stress Concern Present (04/30/2023)   Received from [facility] - Occupational Stress Questionnaire    Feeling of Stress : Only a little  Social Connections: Somewhat Isolated (04/30/2023)   Received from [facility]   Social Network    How would you rate your social network (family, work, friends)?: Restricted participation with some degree of social isolation     Family History: The patient's family history is not on file.  ROS:   Please see the history of present illness.    All other systems reviewed and are negative.  EKGs/Labs/Other Studies Reviewed:         Recent Labs: 04/10/2023: BUN 16; Creatinine, Ser 0.86; Potassium 4.9; Sodium 145   Recent Lipid Panel No results found for: "CHOL", "TRIG", "HDL", "CHOLHDL", "VLDL", "LDLCALC", "LDLDIRECT"  Physical Exam:   VS:  There were no vitals taken for this visit. , BMI There is no height or weight on file to calculate BMI.  There were no vitals filed for this visit.    GENERAL:  Well appearing, overweight HEENT: Pupils equal round and reactive, fundi not visualized, oral  mucosa unremarkable. 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  NECK:  No jugular venous distention, waveform within normal limits, carotid upstroke brisk and symmetric, no bruits, no thyromegaly LYMPHATICS:  No cervical adenopathy LUNGS:  Clear to auscultation bilaterally HEART:  RRR.  PMI not displaced or sustained,S1 and S2 within normal limits, no S3, no S4, no clicks, no rubs, gr 1/6 systolic murmurs ABD:  Flat, positive bowel sounds normal in frequency in pitch, no bruits, no rebound, no guarding, no midline pulsatile mass, no hepatomegaly, no splenomegaly EXT:  2 plus pulses throughout, no edema, no cyanosis no clubbing SKIN:  No rashes no nodules NEURO:  Cranial nerves II through XII grossly intact, motor grossly intact throughout PSYCH:  Cognitively intact, oriented to person place and time   ASSESSMENT/PLAN:    Chronic diastolic heart failure- Euvolemic  and well compensated on exam. Low sodium diet, fluid restriction <2L, and daily weights encouraged. Educated to contact our office for weight gain of 2 lbs overnight or 5 lbs in one week. Not requiring loop diuretic. Continue  Coreg 5 mg twice daily, Jardiance 10 mg daily, spironolactone 25 mg daily.  BP management as below.  HTN- BP elevated in clinic and at home.  Home wrist cuff previously found to read accurately.  Increase irbesartan to 150 mg daily with BMET in 2 weeks.  Continue amlodipine 10 mg daily, chlorthalidone 12.5 mg daily, carvedilol 25 mg twice daily, spironolactone 25 mg daily. Discussed to monitor BP at home at least 2 hours after medications and sitting for 5-10 minutes.   OSA - CPAP compliance encouraged.   CAD s/p PCI /HLD, LDL goal less than 70- Stable with no anginal symptoms. No indication for ischemic evaluation.  GDMT Simvastatin 40 mg daily, Carvedilol 25 mg twice daily.  Screening for Secondary Hypertension:     05/09/2022    4:46 PM  Causes  Sleep Apnea Screened  Thyroid  Disease Screened  Pheochromocytoma Screened  Cushing's Syndrome Screened  Coarctation of the Aorta Screened    Relevant Labs/Studies:    Latest Ref Rng & Units 04/10/2023   11:37 AM 02/06/2023   10:47 AM 01/13/2023   11:10 AM  Basic Labs  Sodium 134 - 144 mmol/L 145  139  146   Potassium 3.5 - 5.2 mmol/L 4.9  4.7  5.1   Creatinine 0.57 - 1.00 mg/dL 1.61  0.96  0.45              Latest Ref Rng & Units 05/15/2022   10:54 AM  Metanephrines/Catecholamines   Epinephrine 0 - 62 pg/mL 41   Norepinephrine 0 - 874 pg/mL 1,091   Dopamine 0 - 48 pg/mL 32   Metanephrines 0.0 - 88.0 pg/mL 45.0   Normetanephrines  0.0 - 285.2 pg/mL 130.0           12/20/2021   10:26 AM  Renovascular   Renal Artery [place] Completed Yes     Disposition:    FU with MD/PharmD in 3 mos   Medication Adjustments/Labs and Tests Ordered: Current medicines are reviewed at length with the patient today.  Concerns regarding medicines are outlined above.  No orders of the defined types were placed in this encounter.  No orders of the defined types were placed in this encounter.   Signed, Alver Sorrow, NP  08/07/2023 10:42 AM    [facility]

## 2023-08-07 NOTE — Patient Instructions (Addendum)
 Medication Instructions / Medicamentos  Change Irbesartan to 150mg  daily  [place] Irbesartan 150mg  cada dia    Labwork / Laboratorios  On the day of your carotid duplex: BMET  Laboratorios 08/19/23 (la misma dia de ultrasonido)   Follow-Up / [place] 2 months with Advanced Hypertension Clinic  En 2 meses

## 2023-08-18 DIAGNOSIS — E1165 Type 2 diabetes mellitus with hyperglycemia: Secondary | ICD-10-CM | POA: Diagnosis not present

## 2023-08-18 DIAGNOSIS — Z794 Long term (current) use of insulin: Secondary | ICD-10-CM | POA: Diagnosis not present

## 2023-08-19 ENCOUNTER — Ambulatory Visit (HOSPITAL_BASED_OUTPATIENT_CLINIC_OR_DEPARTMENT_OTHER)

## 2023-08-19 DIAGNOSIS — I6523 Occlusion and stenosis of bilateral carotid arteries: Secondary | ICD-10-CM

## 2023-08-19 DIAGNOSIS — I10 Essential (primary) hypertension: Secondary | ICD-10-CM | POA: Diagnosis not present

## 2023-08-20 ENCOUNTER — Other Ambulatory Visit: Payer: Self-pay

## 2023-08-20 ENCOUNTER — Telehealth: Payer: Self-pay

## 2023-08-20 DIAGNOSIS — E875 Hyperkalemia: Secondary | ICD-10-CM

## 2023-08-20 LAB — BASIC METABOLIC PANEL
BUN/Creatinine Ratio: 18 (ref 12–28)
BUN: 20 mg/dL (ref 8–27)
CO2: 26 mmol/L (ref 20–29)
Calcium: 10.1 mg/dL (ref 8.7–10.3)
Chloride: 104 mmol/L (ref 96–106)
Creatinine, Ser: 1.13 mg/dL — ABNORMAL HIGH (ref 0.57–1.00)
Glucose: 103 mg/dL — ABNORMAL HIGH (ref 70–99)
Potassium: 5.7 mmol/L — ABNORMAL HIGH (ref 3.5–5.2)
Sodium: 142 mmol/L (ref 134–144)
eGFR: 50 mL/min/{1.73_m2} — ABNORMAL LOW (ref >59)

## 2023-08-20 NOTE — Progress Notes (Signed)
 bmet

## 2023-08-20 NOTE — Telephone Encounter (Signed)
 Received a call from patient's daughter Lissa Hoard.Daughter was calling back mother does not understand medication instructions given to her earlier today.Advised of recent bmet results which revealed elevated potassium and decreased kidney function.Advised she is to hold Irbesartan tomorrow and then decrease dose to 75 mg daily.Increase Chlorthalidone to 25 mg daily.Increase oral hydration.Repeat bmet Fri 3/28.  Daughter first stated mother is already taking Irbesartan 75 mg daily and Chlorthalidone 25 mg daily.Stated she adjust her own medications according to what her B/P is reading.Advised mother should not be adjusting medications on her own. Then daughter states she was taking Irbesartan 150 mg daily and Chlorthalidone 25 mg daily.Advised to have Bmet this Fri 3/28.I will make [name] NP aware.

## 2023-08-21 ENCOUNTER — Telehealth (HOSPITAL_BASED_OUTPATIENT_CLINIC_OR_DEPARTMENT_OTHER): Payer: Self-pay

## 2023-08-21 DIAGNOSIS — E785 Hyperlipidemia, unspecified: Secondary | ICD-10-CM

## 2023-08-21 DIAGNOSIS — I6523 Occlusion and stenosis of bilateral carotid arteries: Secondary | ICD-10-CM

## 2023-08-21 NOTE — Telephone Encounter (Signed)
 Thank you for reviewing again wit her. Agree she should not adjust medications as she sees fit. I will make note for myself and be sure to discuss again at her upcoming office visit.   Alver Sorrow, NP

## 2023-08-21 NOTE — Telephone Encounter (Addendum)
 Called patient with interpreter services, ID # [id number]. Results called to patient who verbalizes understanding! Fax sent to request most recent labs. Repeat testing ordered for one year.     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  Left carotid artery 1-39% stenosed.  Right carotid artery with increased plaque buildup compared to previous.  Please ensure taking atorvastatin 40 mg daily to prevent progression. please request most recent cholesterol labs from primary care provider.  Repeat carotid duplex in 1 year for monitoring.

## 2023-08-22 ENCOUNTER — Telehealth (HOSPITAL_BASED_OUTPATIENT_CLINIC_OR_DEPARTMENT_OTHER): Payer: Self-pay | Admitting: *Deleted

## 2023-08-22 DIAGNOSIS — E875 Hyperkalemia: Secondary | ICD-10-CM | POA: Diagnosis not present

## 2023-08-22 NOTE — Telephone Encounter (Signed)
 Reasonable to continue atorvastatin every other day and repeat lipid panel with PCP in October prior to making additional changes.   Alver Sorrow, NP

## 2023-08-22 NOTE — Telephone Encounter (Signed)
Advised daughter, verbalized understanding  

## 2023-08-22 NOTE — Telephone Encounter (Signed)
 Daughter and patient came in to discuss medications/carotid duplex results  Patient ended up taking Irbesartan last night secondary to SBP being 196  Did get labs today  Blood pressure in office today 138/64  Discussed with Ronn Melena NP and ok to just continue the Irbesartan 75 mg daily and see what labs show   Reviewed the Carotid results with patient She was not taking the Atorvastatin when she had her labs checked last Since having labs she resumed Atorvastatin at every other day  Spoke with PCP, last lipid panel in October  Lipid panel from January in [facility]   Will forward to Ronn Melena NP for review

## 2023-08-25 ENCOUNTER — Telehealth (HOSPITAL_BASED_OUTPATIENT_CLINIC_OR_DEPARTMENT_OTHER): Payer: Self-pay

## 2023-08-25 DIAGNOSIS — E875 Hyperkalemia: Secondary | ICD-10-CM

## 2023-08-25 DIAGNOSIS — I10 Essential (primary) hypertension: Secondary | ICD-10-CM

## 2023-08-25 NOTE — Telephone Encounter (Signed)
 Pt called with Interpreter services ID# [phone], pt. Will speak with her daughter and go for repeat labs either today or tomorrow!

## 2023-08-26 DIAGNOSIS — E875 Hyperkalemia: Secondary | ICD-10-CM | POA: Diagnosis not present

## 2023-08-26 DIAGNOSIS — I10 Essential (primary) hypertension: Secondary | ICD-10-CM | POA: Diagnosis not present

## 2023-08-27 LAB — BASIC METABOLIC PANEL WITH GFR
BUN/Creatinine Ratio: 19 (ref 12–28)
BUN/Creatinine Ratio: 17 (ref 12–28)
BUN: 18 mg/dL (ref 8–27)
CO2: 22 mmol/L (ref 20–29)
Calcium: 10.6 mg/dL — ABNORMAL HIGH (ref 8.7–10.3)
Calcium: 9.7 mg/dL (ref 8.7–10.3)
Chloride: 113 mmol/L — ABNORMAL HIGH (ref 96–106)
Chloride: 103 mmol/L (ref 96–106)
Creatinine, Ser: 1.08 mg/dL — ABNORMAL HIGH (ref 0.57–1.00)
Creatinine, Ser: 1.08 mg/dL — ABNORMAL HIGH (ref 0.57–1.00)
Glucose: 215 mg/dL — ABNORMAL HIGH (ref 70–99)
Potassium: 21 mmol/L (ref 8–27)
Potassium: 5.2 mmol/L (ref 3.5–5.2)
Sodium: 150 mmol/L — ABNORMAL HIGH (ref 134–144)
Sodium: 142 mmol/L (ref 134–144)
eGFR: 53 mL/min/{1.73_m2} — ABNORMAL LOW (ref >59)
eGFR: 53 mL/min/{1.73_m2} — ABNORMAL LOW (ref >59)

## 2023-08-28 ENCOUNTER — Telehealth (HOSPITAL_BASED_OUTPATIENT_CLINIC_OR_DEPARTMENT_OTHER): Payer: Self-pay

## 2023-08-28 NOTE — Telephone Encounter (Addendum)
 Called patient with spanish interpreter, ID # [id number]. Called results to patient and left results on VM (ok per DPR), instructions left to call office with updated BP log.    ----- Message from Alver Sorrow sent at 08/28/2023 12:20 PM EDT ----- Normal kidney function.  Potassium has normalized.  Good result!  Continue current medications.  Recommend obtaining updated BP log since last medication change.

## 2023-09-02 ENCOUNTER — Other Ambulatory Visit (HOSPITAL_BASED_OUTPATIENT_CLINIC_OR_DEPARTMENT_OTHER): Payer: Self-pay | Admitting: Cardiovascular Disease

## 2023-09-02 ENCOUNTER — Other Ambulatory Visit (HOSPITAL_BASED_OUTPATIENT_CLINIC_OR_DEPARTMENT_OTHER): Payer: Self-pay | Admitting: Family

## 2023-09-02 ENCOUNTER — Other Ambulatory Visit: Payer: Self-pay | Admitting: Cardiology

## 2023-09-02 DIAGNOSIS — I10 Essential (primary) hypertension: Secondary | ICD-10-CM

## 2023-09-04 DIAGNOSIS — E559 Vitamin D deficiency, unspecified: Secondary | ICD-10-CM | POA: Diagnosis not present

## 2023-09-04 DIAGNOSIS — K219 Gastro-esophageal reflux disease without esophagitis: Secondary | ICD-10-CM | POA: Diagnosis not present

## 2023-09-04 DIAGNOSIS — M8589 Other specified disorders of bone density and structure, multiple sites: Secondary | ICD-10-CM | POA: Diagnosis not present

## 2023-09-04 DIAGNOSIS — I10 Essential (primary) hypertension: Secondary | ICD-10-CM | POA: Diagnosis not present

## 2023-09-04 DIAGNOSIS — M1990 Unspecified osteoarthritis, unspecified site: Secondary | ICD-10-CM | POA: Diagnosis not present

## 2023-09-04 DIAGNOSIS — E782 Mixed hyperlipidemia: Secondary | ICD-10-CM | POA: Diagnosis not present

## 2023-09-04 DIAGNOSIS — R011 Cardiac murmur, unspecified: Secondary | ICD-10-CM | POA: Diagnosis not present

## 2023-09-04 DIAGNOSIS — E1165 Type 2 diabetes mellitus with hyperglycemia: Secondary | ICD-10-CM | POA: Diagnosis not present

## 2023-09-04 DIAGNOSIS — Z794 Long term (current) use of insulin: Secondary | ICD-10-CM | POA: Diagnosis not present

## 2023-09-16 DIAGNOSIS — H43813 Vitreous degeneration, bilateral: Secondary | ICD-10-CM | POA: Diagnosis not present

## 2023-09-16 DIAGNOSIS — H43392 Other vitreous opacities, left eye: Secondary | ICD-10-CM | POA: Diagnosis not present

## 2023-09-16 DIAGNOSIS — H401134 Primary open-angle glaucoma, bilateral, indeterminate stage: Secondary | ICD-10-CM | POA: Diagnosis not present

## 2023-09-16 DIAGNOSIS — H35373 Puckering of macula, bilateral: Secondary | ICD-10-CM | POA: Diagnosis not present

## 2023-09-16 DIAGNOSIS — E113393 Type 2 diabetes mellitus with moderate nonproliferative diabetic retinopathy without macular edema, bilateral: Secondary | ICD-10-CM | POA: Diagnosis not present

## 2023-09-18 DIAGNOSIS — M1990 Unspecified osteoarthritis, unspecified site: Secondary | ICD-10-CM | POA: Diagnosis not present

## 2023-09-18 DIAGNOSIS — R011 Cardiac murmur, unspecified: Secondary | ICD-10-CM | POA: Diagnosis not present

## 2023-09-18 DIAGNOSIS — M19011 Primary osteoarthritis, right shoulder: Secondary | ICD-10-CM | POA: Diagnosis not present

## 2023-09-18 DIAGNOSIS — E1165 Type 2 diabetes mellitus with hyperglycemia: Secondary | ICD-10-CM | POA: Diagnosis not present

## 2023-09-18 DIAGNOSIS — E559 Vitamin D deficiency, unspecified: Secondary | ICD-10-CM | POA: Diagnosis not present

## 2023-09-18 DIAGNOSIS — I10 Essential (primary) hypertension: Secondary | ICD-10-CM | POA: Diagnosis not present

## 2023-09-18 DIAGNOSIS — K219 Gastro-esophageal reflux disease without esophagitis: Secondary | ICD-10-CM | POA: Diagnosis not present

## 2023-09-18 DIAGNOSIS — Z794 Long term (current) use of insulin: Secondary | ICD-10-CM | POA: Diagnosis not present

## 2023-09-18 DIAGNOSIS — E782 Mixed hyperlipidemia: Secondary | ICD-10-CM | POA: Diagnosis not present

## 2023-09-19 IMAGING — CT CT HEAD W/O CM
3 of 4 series · 14 of 47 positions shown, 16 images · non-contrast
Comparison: None Available.

CLINICAL DATA: Headache, classic migraine



[Series 4: coronal soft · coronal · 0.30mm/px · 3 of 67 slices shown]
[im 23/67  brain]
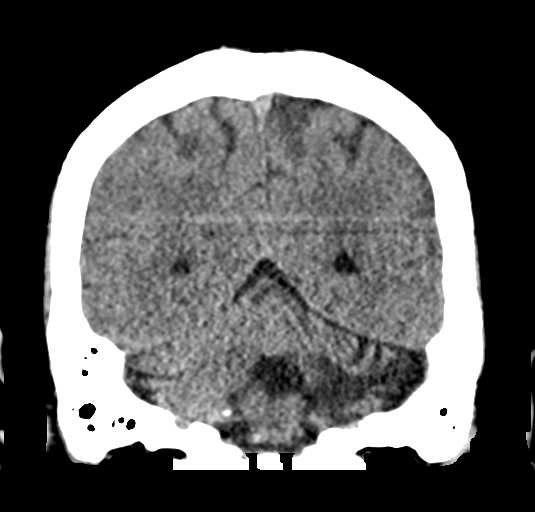
[im 30/67  brain]
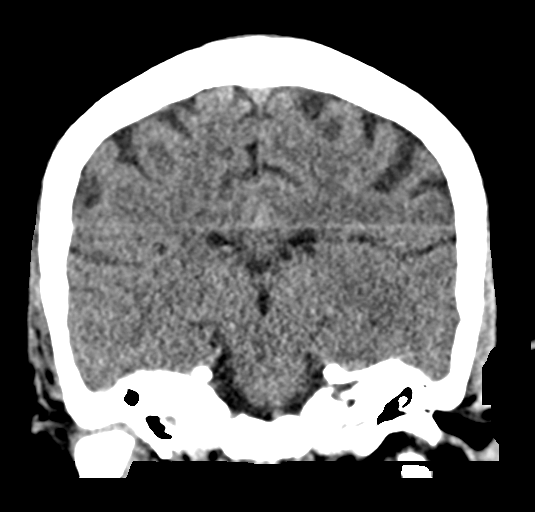
[im 37/67  brain]
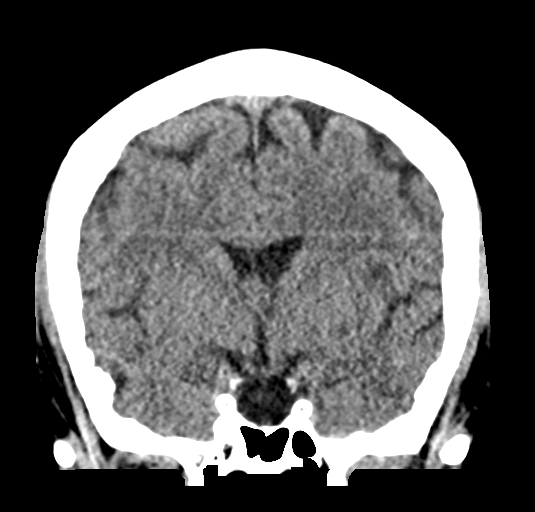

[Series 5: sag soft · sagittal · 0.30mm/px · 3 of 54 slices shown]
[im 18/54  brain]
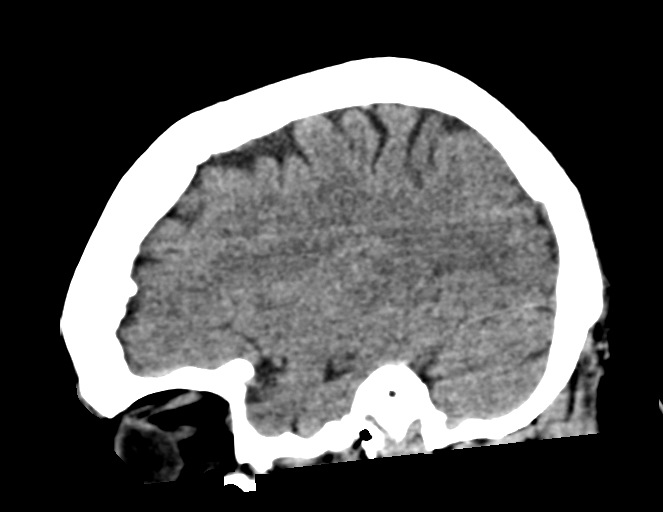
[im 27/54  brain]
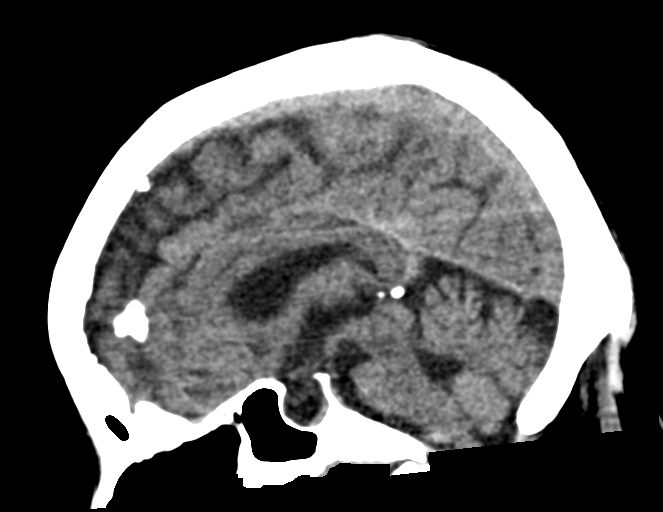
[im 36/54  brain]
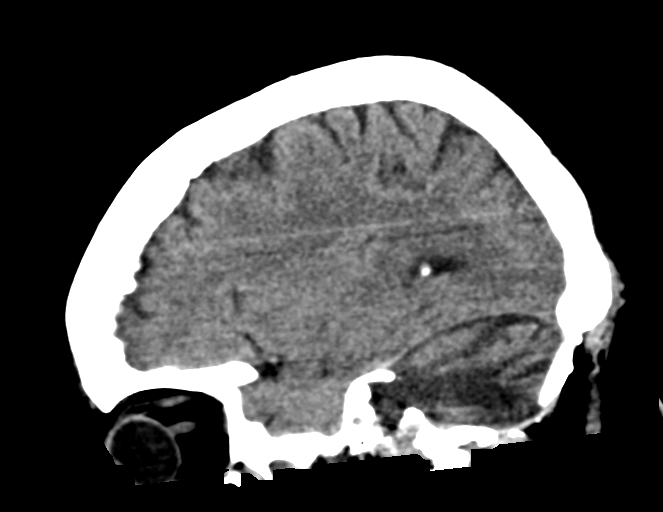

[Series 6: true axial · axial · 0.31mm/px · z∈[-162,-43]mm · 8 of 52 slices shown, 10 images]
[im 6/52  brain]
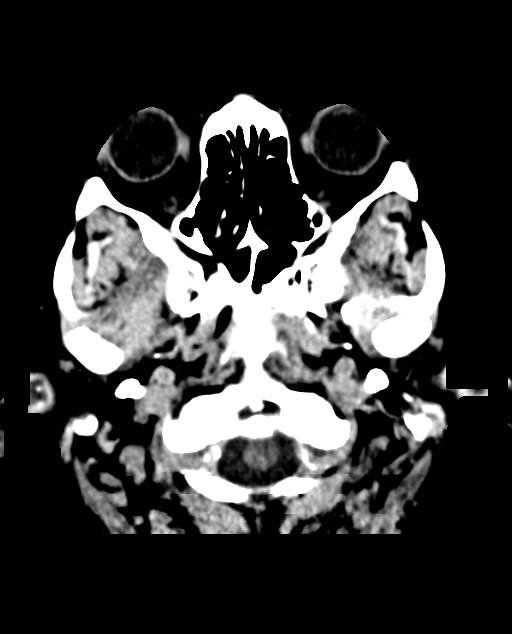
[im 6/52  bone]
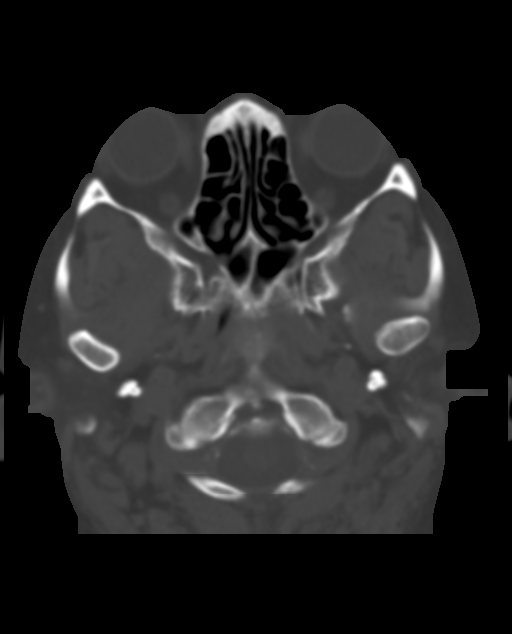
[im 12/52  brain]
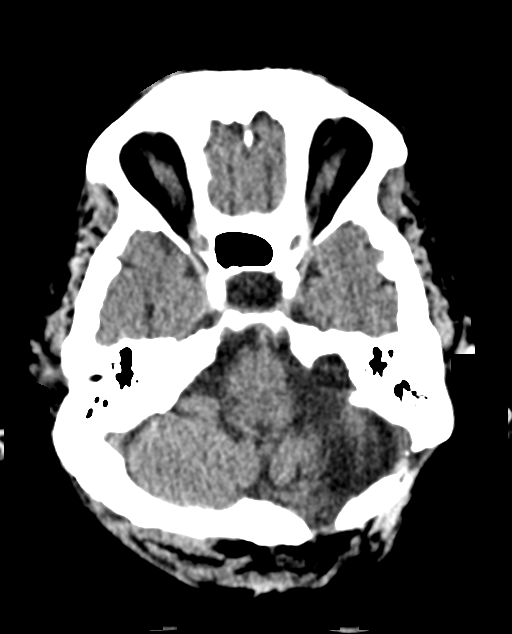
[im 18/52  brain]
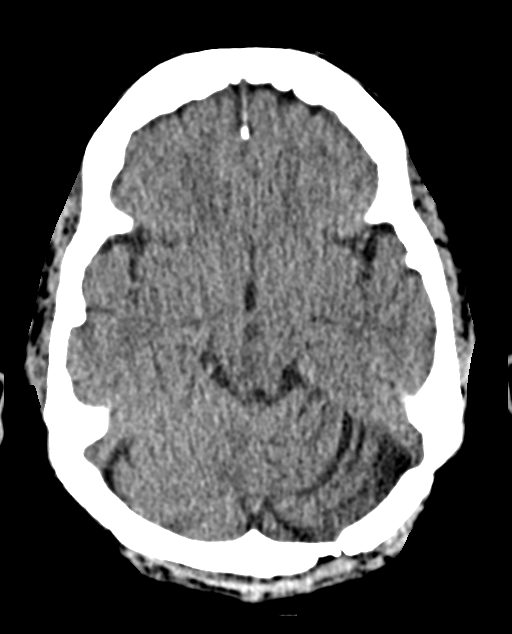
[im 23/52  brain]
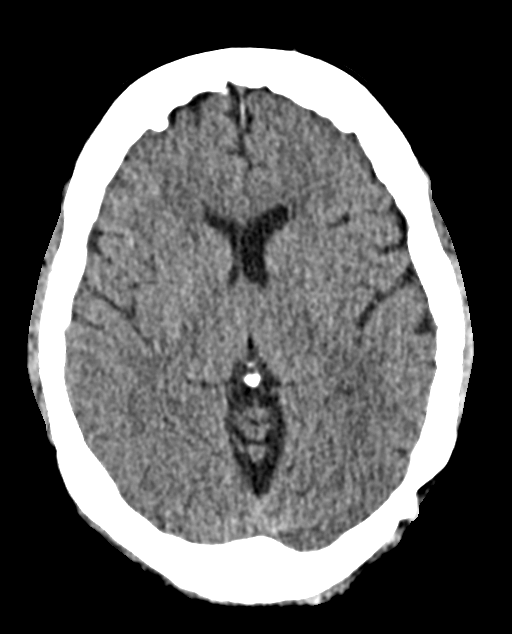
[im 29/52  brain]
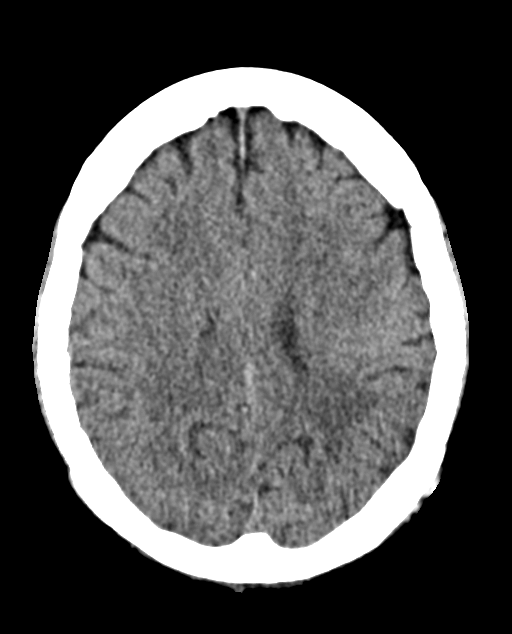
[im 29/52  bone]
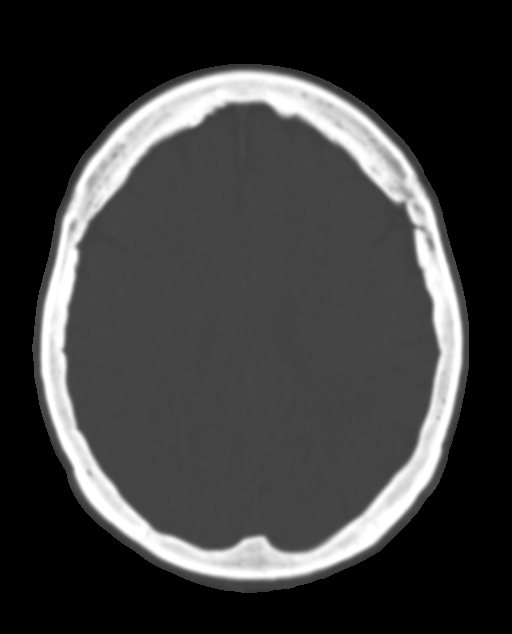
[im 35/52  brain]
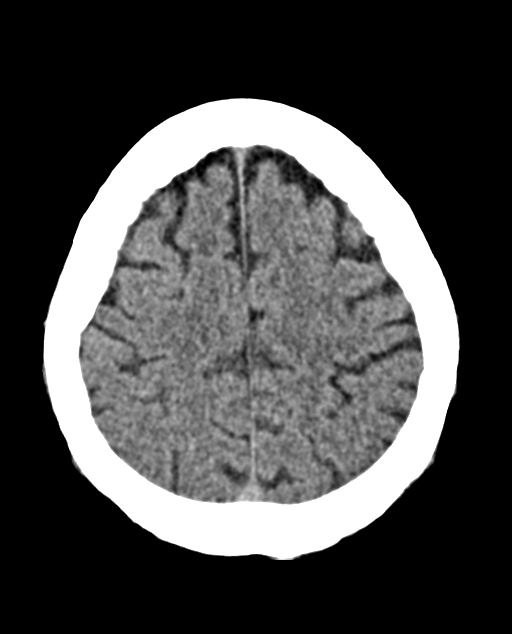
[im 40/52  brain]
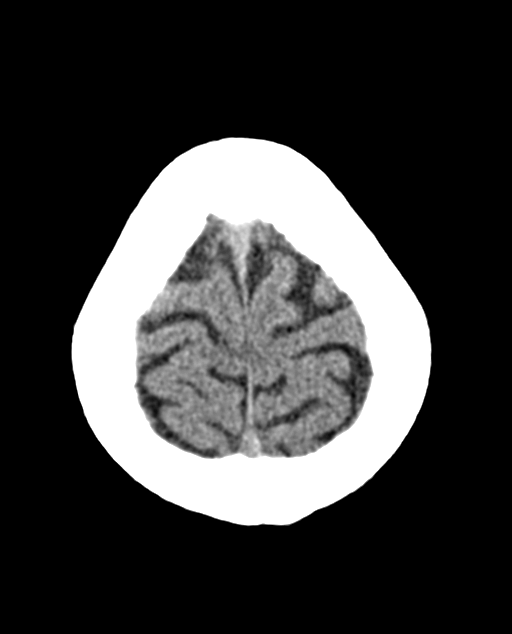
[im 46/52  brain]
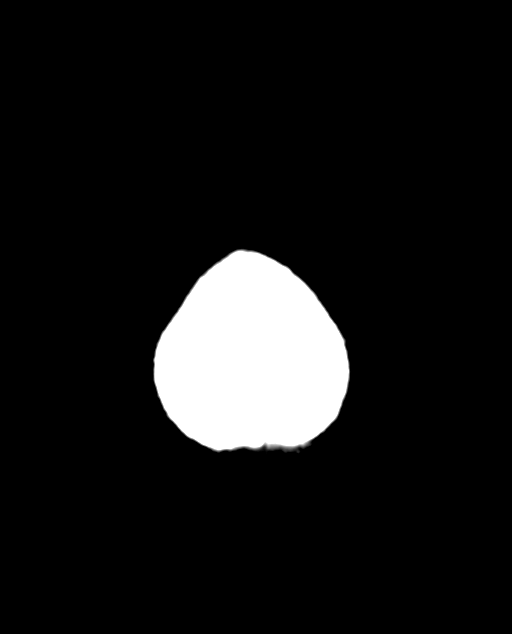

[14 of 47 positions shown; findings below may reference images not displayed]

FINDINGS: Brain: There is no acute intracranial hemorrhage, mass effect, or
edema. No acute appearing loss of gray-white differentiation. There
is encephalomalacia in the left cerebellum extending into the
brachium pontis. There is no extra-axial fluid collection.
Ventricles and sulci are within normal limits in size and
configuration.

Vascular: There is atherosclerotic calcification at the skull base.

Skull: Left lateral suboccipital craniotomy. Operative changes
appear to involve the internal auditory canal.

Sinuses/Orbits: No acute finding.

Other: Empty sella.  Mastoid air cells are clear.
IMPRESSION: No acute intracranial abnormality. Chronic/nonemergent findings
detailed above.

## 2023-10-02 ENCOUNTER — Ambulatory Visit (HOSPITAL_BASED_OUTPATIENT_CLINIC_OR_DEPARTMENT_OTHER): Admitting: Family

## 2023-10-02 VITALS — BP 193/76 | HR 66 | Ht 59.0 in | Wt 153.1 lb

## 2023-10-02 DIAGNOSIS — I25118 Atherosclerotic heart disease of native coronary artery with other forms of angina pectoris: Secondary | ICD-10-CM | POA: Diagnosis not present

## 2023-10-02 DIAGNOSIS — G4733 Obstructive sleep apnea (adult) (pediatric): Secondary | ICD-10-CM

## 2023-10-02 DIAGNOSIS — I10 Essential (primary) hypertension: Secondary | ICD-10-CM

## 2023-10-02 DIAGNOSIS — I5032 Chronic diastolic (congestive) heart failure: Secondary | ICD-10-CM | POA: Diagnosis not present

## 2023-10-02 DIAGNOSIS — E785 Hyperlipidemia, unspecified: Secondary | ICD-10-CM

## 2023-10-02 NOTE — Progress Notes (Unsigned)
 [facility] Assessment:    Date:  10/03/2023   ID:  Carolyn Little, DOB [birth year], MRN [id number]  PCP:  Dianah Fort, PA  Cardiologist:  Dorothye Gathers, MD  Nephrologist:  Referring MD: Tretha Fu, MD   CC: Hypertension  History of Present Illness:    Carolyn Little is a 78 y.o. female with a hx of hypertension, hyperlipidemia, diabetes, CAD s/p PCI, diastolic heart failure here to follow up in the [facility].   Initially established with [facility] 05/09/2022.  She had previously worked Dr. Renna Cary and self discontinued valsartan /HCTZ and amlodipine .  Echo 12/13/2021 LVEF 70 to 75%.  Renal duplex 12/20/2021 negative for renal artery stenosis.  She was noted to have sleep apnea but not adherent to CPAP, encouraged to utilize regularly.  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  She was admitted 06/2022 for pulmonary edema.  Echo at that time LVEF 65% with mild LVH and indeterminate diastolic function.  She was diuresed and BP controlled in the hospital.  Seen 01/02/2023 with initial BP 186/75 and repeat 176/84. She was no longer taking amlodipine .  As she was not using Lasix  regularly it was discontinued and she was started on chlorthalidone  25 mg daily.  Carvedilol , spironolactone , irbesartan , Jardiance  were continued.  Via subsequent labs due to creatinine 1.12, BUN 30 her Chlorthalidone  was reduced to 12.5 mg daily.   At visit 02/06/23 she was euvolemic, Amlodipine  increased to 5mg  daily. Spironolactone  25mg  daily, Irbesartan  75 mg daily, Coreg  25mg  BID, Chlorthalidone  12.5mg  daily  were  continued. Home wrist cuff found to read accurately for systolic (+4) but inaccurate diastolic (+16).    Last seen 04/10/2023, amlodipine  increased to 10 mg daily.  Presents today for follow up with her daughter. Visit assisted by interpretor.  BP at home persistently elevated. In the morning readings such as  142/69. She recently was in [place] to visit family and sadly her sister passed away, offered condolences. While in [place] BP 160-200s. She did go to the ED there for elevated BP and LE edema which improved with IV Lasix .  She did note dietary indiscretion we discussed increase stressors as well as increased sodium intake could contribute to elevated BP and fluid retention.  Plans to start walking for exercise as the weather warms up.***  BP at home   Irbesartan    106-190  Increase irbesartan  to 150 mg daily with BMET in 2 weeks.  Continue amlodipine  10 mg daily, chlorthalidone  12.5 mg daily, carvedilol  25 mg twice daily, spironolactone  25 mg daily.   Gets up around 10 am Gets up at different times  Valsarta hydrochlorothiazide  Chorlthalidone too? Take farxiga off list   Valsartan -hydrochlorothiazide ? 09/02/23 furosemide  Irbesartan ?  Previous antihypertensives: Valsartan -HCTZ  Past Medical History:  Diagnosis Date   Arthritis    Dizziness    DM (diabetes mellitus) (HCC)    Generalized headaches    Hearing loss    High cholesterol    Hypertension    Low back pain    Obesity    OSA (obstructive sleep apnea) 06/18/2022   Renal insufficiency    Seizure disorder (HCC)     Past Surgical History:  Procedure Laterality Date   BACK SURGERY     BRAIN TUMOR EXCISION  1984   HAND SURGERY     LAPAROSCOPIC HYSTERECTOMY     SHOULDER SURGERY      Current Medications: Current Meds  Medication Sig   ACCU-CHEK  GUIDE test strip as directed as directed   acetaminophen  (TYLENOL ) 325 MG tablet Take 325-650 mg by mouth every 6 (six) hours as needed.   amLODipine  (NORVASC ) 2.5 MG tablet TAKE 1 TABLET BY MOUTH EVERY DAY   atorvastatin  (LIPITOR) 40 MG tablet Take 40 mg by mouth every other day.   Blood Glucose Monitoring Suppl (ACCU-CHEK GUIDE) w/Device KIT    Blood Glucose Monitoring Suppl (GLUCOCOM BLOOD GLUCOSE MONITOR) DEVI 1 Device by Misc.(Non-Drug; Combo Route) route as  needed (to check blood glucose).   Blood Glucose Monitoring Suppl (ONETOUCH VERIO FLEX SYSTEM) w/Device KIT 1 DEVICE BY MISC.(NON-DRUG COMBO ROUTE) ROUTE AS NEEDED (TO CHECK BLOOD GLUCOSE).   carvedilol  (COREG ) 25 MG tablet TAKE 1 TABLET (25 MG TOTAL) BY MOUTH TWICE A DAY WITH MEALS   chlorthalidone  (HYGROTON ) 25 MG tablet TAKE 1 TABLET (25 MG TOTAL) BY MOUTH DAILY.   Continuous Blood Gluc Receiver (DEXCOM G6 RECEIVER) DEVI Use as directed for continuous glucose monitoring.   Continuous Blood Gluc Sensor (DEXCOM G6 SENSOR) MISC Inject 1 sensor to the skin every 10 days for continuous glucose monitoring.   Continuous Blood Gluc Transmit (DEXCOM G6 TRANSMITTER) MISC Use as directed for continuous glucose monitoring. Reuse transmitter for 90 days then discard and replace.   [employer] ASPIRIN  ADULT LOW DOSE 81 MG chewable tablet Chew 81 mg by mouth daily.   diclofenac Sodium (VOLTAREN) 1 % GEL Apply 2 g topically 4 (four) times daily as needed.   Dulaglutide (TRULICITY) 3 MG/0.5ML SOPN Inject 3 mg into the skin every 7 (seven) days.   empagliflozin  (JARDIANCE ) 10 MG TABS tablet Take 1 tablet (10 mg total) by mouth daily before breakfast.   famotidine  (PEPCID ) 40 MG tablet Take 40 mg by mouth daily.   glucose blood (ONETOUCH VERIO) test strip To monitor sugar 4 times per day   Insulin  Degludec (TRESIBA) 100 UNIT/ML SOLN Inject 20 Units into the skin every evening. Taking 40 units in the evening   Insulin  Pen Needle (BD PEN NEEDLE NANO U/F) 32G X 4 MM MISC Use once daily for [place] and once weekly for Trulicity   irbesartan  (AVAPRO ) 75 MG tablet Take 1 tablet (75 mg total) by mouth daily.   lamoTRIgine  (LAMICTAL ) 150 MG tablet Take 1 tablet (150 mg total) by mouth daily.   Lancets (ONETOUCH DELICA PLUS LANCET30G) MISC 1 DEVICE BY MISC.(NON-DRUG COMBO ROUTE) ROUTE 4 TIMES DAILY.   levETIRAcetam  (KEPPRA ) 500 MG tablet Take 1 tablet (500 mg total) by mouth every evening.   metFORMIN (GLUCOPHAGE) 500 MG tablet  Take 500 mg by mouth 2 (two) times daily.   ONETOUCH VERIO test strip 4 (four) times daily.   SIMBRINZA 1-0.2 % SUSP Apply 1 drop to eye 2 (two) times daily.   simvastatin  (ZOCOR ) 40 MG tablet Take 40 mg by mouth daily.   spironolactone  (ALDACTONE ) 25 MG tablet TAKE 1/2 TABLET BY MOUTH EVERY DAY   VASCEPA 1 g capsule Take 2 g by mouth daily.   [DISCONTINUED] FARXIGA 5 MG TABS tablet Take 5 mg by mouth daily.   [DISCONTINUED] valsartan -hydrochlorothiazide  (DIOVAN -HCT) 80-12.5 MG tablet TAKE 1 TABLET BY MOUTH EVERY DAY     Allergies:   Atorvastatin  and Levaquin [levofloxacin]   Social History   Socioeconomic History   Marital status: Widowed    Spouse name: Not on file   Number of children: 4   Years of education: Not on file   Highest education level: Not on file  Occupational History   Not  on file  Tobacco Use   Smoking status: Never   Smokeless tobacco: Never  Vaping Use   Vaping status: Never Used  Substance and Sexual Activity   Alcohol use: Not Currently   Drug use: Never   Sexual activity: Not on file  Other Topics Concern   Not on file  Social History Narrative   Lives alone   Social Drivers of Health   Financial Resource Strain: Low Risk  (04/30/2023)   Received from [facility]   Overall Financial Resource Strain (CARDIA)    Difficulty of Paying Living Expenses: Not very hard  Food Insecurity: Food Insecurity Present (04/30/2023)   Received from [facility]   Hunger Vital Sign    Worried About Running Out of Food in the Last Year: Sometimes true    Ran Out of Food in the Last Year: Never true  Transportation Needs: No Transportation Needs (04/30/2023)   Received from [facility] - Transportation    Lack of Transportation (Medical): No    Lack of Transportation (Non-Medical): No  Physical Activity: Unknown (04/30/2023)   [facility]   Exercise Vital Sign    Days of Exercise per Week: 0 days    Minutes of Exercise per Session:  Not on file  Stress: No Stress Concern Present (04/30/2023)   Received from [facility] - Occupational Stress Questionnaire    Feeling of Stress : Only a little  Social Connections: Somewhat Isolated (04/30/2023)   Received from [facility]   Social Network    How would you rate your social network (family, work, friends)?: Restricted participation with some degree of social isolation     Family History: The patient's family history is not on file.  ROS:   Please see the history of present illness.    All other systems reviewed and are negative.  EKGs/Labs/Other Studies Reviewed:         Recent Labs: 08/26/2023: BUN 18; Creatinine, Ser 1.08; Potassium 5.2; Sodium 142   Recent Lipid Panel No results found for: "CHOL", "TRIG", "HDL", "CHOLHDL", "VLDL", "LDLCALC", "LDLDIRECT"  Physical Exam:   VS:  BP (!) 193/76   Pulse 66   Ht 4\' 11"  (1.499 m)   Wt 153 lb 1.6 oz (69.4 kg)   SpO2 99%   BMI 30.92 kg/m  , BMI Body mass index is 30.92 kg/m.  Vitals:   10/02/23 1100  BP: (!) 193/76  Pulse: 66  Height: 4\' 11"  (1.499 m)  Weight: 153 lb 1.6 oz (69.4 kg)  SpO2: 99%  BMI (Calculated): 30.91   GENERAL:  Well appearing, overweight HEENT: Pupils equal round and reactive, fundi not visualized, oral mucosa unremarkable. 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  NECK:  No jugular venous distention, waveform within normal limits, carotid upstroke brisk and symmetric, no bruits, no thyromegaly LYMPHATICS:  No cervical adenopathy LUNGS:  Clear to auscultation bilaterally HEART:  RRR.  PMI not displaced or sustained,S1 and S2 within normal limits, no S3, no S4, no clicks, no rubs, gr 1/6 systolic murmurs ABD:  Flat, positive bowel sounds normal in frequency in pitch, no bruits, no rebound, no guarding, no midline pulsatile mass, no hepatomegaly, no splenomegaly EXT:  2 plus pulses throughout, no edema, no cyanosis no  clubbing SKIN:  No rashes no nodules NEURO:  Cranial nerves II through XII grossly intact, motor grossly intact throughout PSYCH:  Cognitively intact, oriented to person  place and time   ASSESSMENT/PLAN:    Chronic diastolic heart failure- Euvolemic and well compensated on exam. Low sodium diet, fluid restriction <2L, and daily weights encouraged. Educated to contact our office for weight gain of 2 lbs overnight or 5 lbs in one week. Not requiring loop diuretic. Continue  Coreg  5 mg twice daily, Jardiance  10 mg daily, spironolactone  25 mg daily.  BP management as below.  HTN- BP elevated in clinic and at home.  Home wrist cuff previously found to read accurately.  Labile BP at home due to inconsistent timing of medication.   We discussed setting medication routine for improvement in BP. Offered to send prescriptions to [facility] pharmacy for adherence packaging which she declines at this time. Continue present regimen carvedilol  25 mg twice per day, chlorthalidone  25 mg daily, spironolactone  12.5 mg daily, irbesartan  75 mg daily, amlodipine  2.5 mg daily. Of note previously unable to further escalate irbesartan  dosing due to hyperkalemia Prompt follow-up in 1 month with pill bottles to reassess BP control.  Consider increasing dose of amlodipine  at that time if BP not at goal. Call made to pharmacy to ensure they were only filling her present antihypertensives as they have been filling old prescriptions as well. Discussed to monitor BP at home at least 2 hours after medications and sitting for 5-10 minutes.   OSA - CPAP compliance encouraged.   CAD s/p PCI /HLD, LDL goal less than 70- Stable with no anginal symptoms. No indication for ischemic evaluation.  GDMT Simvastatin  40 mg daily, Carvedilol  25 mg twice daily.  Screening for Secondary Hypertension:     05/09/2022    4:46 PM  Causes  Sleep Apnea Screened  Thyroid  Disease Screened  Pheochromocytoma Screened  Cushing's Syndrome Screened   Coarctation of the Aorta Screened    Relevant Labs/Studies:    Latest Ref Rng & Units 08/26/2023   11:45 AM 08/22/2023   11:43 AM 08/19/2023    1:23 PM  Basic Labs  Sodium 134 - 144 mmol/L 142  150  142   Potassium 3.5 - 5.2 mmol/L 5.2  CANCELED  5.7   Creatinine 0.57 - 1.00 mg/dL 0.86  5.78  4.69              Latest Ref Rng & Units 05/15/2022   10:54 AM  Metanephrines/Catecholamines   Epinephrine 0 - 62 pg/mL 41   Norepinephrine 0 - 874 pg/mL 1,091   Dopamine 0 - 48 pg/mL 32   Metanephrines 0.0 - 88.0 pg/mL 45.0   Normetanephrines  0.0 - 285.2 pg/mL 130.0           12/20/2021   10:26 AM  Renovascular   Renal Artery US  Completed Yes     Disposition:    FU with MD/PharmD in 1 month  Medication Adjustments/Labs and Tests Ordered: Current medicines are reviewed at length with the patient today.  Concerns regarding medicines are outlined above.  No orders of the defined types were placed in this encounter.  No orders of the defined types were placed in this encounter.   Signed, Clearnce Curia, NP  10/03/2023 9:38 AM    [facility]

## 2023-10-02 NOTE — Patient Instructions (Addendum)
 Medication Instructions:   Carvedilol  25mg  TWICE  per day Chlorthalidone  25mg  once daily Spironolactone  12.5mg  (half tablet) once daily Irbesartan  75mg  once daily Amlodipine  2.5mg  once daily    Follow-Up/[place] Please follow up in 1 months in [facility] with Dr. Theodis Fiscal, Neomi Banks, NP or Donivan Furry PharmD  [place] en un mes   Special Instructions/Instrucciones: Bring pill bottles to your next appointment. Toca los medicamentos a la [place] [place] por favor.

## 2023-10-05 ENCOUNTER — Encounter (HOSPITAL_BASED_OUTPATIENT_CLINIC_OR_DEPARTMENT_OTHER): Payer: Self-pay | Admitting: Family

## 2023-10-28 ENCOUNTER — Encounter (HOSPITAL_BASED_OUTPATIENT_CLINIC_OR_DEPARTMENT_OTHER): Payer: Self-pay | Admitting: Pharmacist Clinician (PhC)/ Clinical Pharmacy Specialist

## 2023-10-28 ENCOUNTER — Ambulatory Visit (INDEPENDENT_AMBULATORY_CARE_PROVIDER_SITE_OTHER): Admitting: Pharmacist Clinician (PhC)/ Clinical Pharmacy Specialist

## 2023-10-28 VITALS — BP 180/84 | HR 68

## 2023-10-28 DIAGNOSIS — I10 Essential (primary) hypertension: Secondary | ICD-10-CM | POA: Diagnosis not present

## 2023-10-28 NOTE — Progress Notes (Signed)
 Office Visit    Patient Name: Carolyn Little Date of Encounter: 10/31/2023  Primary Care Provider:  Dianah Fort, PA Primary Cardiologist:  Dorothye Gathers, MD  Chief Complaint    Hypertension - Advanced hypertension clinic  Past Medical History   CHF Chronic diastolic - euvolemic w/o diuretic, on carvedilol , empagliflozin , spironolactone , irbesartan   CAD S/P PCI on simvastatin  40, carvedilol  25  HLD  LDL on simvastatin  40  DM2 3/25 A1c 7.3, on dulagutide, empagliflozin , Tresiba, metformin  CKD IIIa GFR 53  OSA     Allergies  Allergen Reactions   Atorvastatin  Nausea And Vomiting   Levaquin [Levofloxacin] Hives    History of Present Illness    Carolyn Little is a 78 y.o. female patient who was referred to the [facility] by Dr. Sherlene Diss.  It is reported that she has had hypertension for many years, but was well controlled on valsartan  and carvedilol .  States that after she moved to [place] (from [place]), her pressures started increasing, and now they are quite labile, going from 90-172 in the same day.  Denies any major changes in life.   At her last visit, with [name], she had amlodipine  2.5 mg daiiy added to her meds.    Since her last visit with Catilin, she has begun to report home readings fluctuating wildly within the day.  Reports  systolic readings 85-190, often within 24 hours.  States she takes her carvedilol  and spironolactone , but has since stopped all other antihypertensives.   States that she tapered doses down then quit, one medication at a time, but still having problems with labile readings.   When her pressure drops below 100, she will put some salt on her tongue and drink a little water.  It's hard to determine if hypotensive symptoms occur at specific times of the day or how many times in a week.     Blood Pressure Goal:  130/80  Current Medications: carvedilol  25 mg twice per day,, spironolactone  12.5 mg daily,    Previously tried:   valsartan   -  unsure why switched to irbesartan   Social Hx:      Tobacco: no  Alcohol: rarely wine  Caffeine:  coffee - in the am 1 only  Diet:    eats breakfast and dinner; diet high in rice and beans; not many meats or vegetables; snakcs on fruit occasionally; uses salt in the kitchen when cooking, but not at table,   Exercise: walking about 30 minutes, most days per week   Home BP readings:  home Omron wrist reads within 5 points     Accessory Clinical Findings    Lab Results  Component Value Date   CREATININE 1.08 (H) 08/26/2023   BUN 18 08/26/2023   NA 142 08/26/2023   K 5.2 08/26/2023   CL 103 08/26/2023   CO2 22 08/26/2023   Lab Results  Component Value Date   ALT 15 07/16/2022   AST 12 (L) 07/16/2022   ALKPHOS 55 07/16/2022   BILITOT 0.6 07/16/2022   No results found for: HGBA1C  Screening for Secondary Hypertension:      05/09/2022    4:46 PM  Causes  Sleep Apnea Screened  Thyroid  Disease Screened  Pheochromocytoma Screened  Cushing's Syndrome Screened  Coarctation of the Aorta Screened    Relevant Labs/Studies:    Latest Ref Rng & Units 08/26/2023   11:45 AM 08/22/2023   11:43 AM 08/19/2023    1:23 PM  Basic Labs  Sodium 134 -  144 mmol/L 142  150  142   Potassium 3.5 - 5.2 mmol/L 5.2  CANCELED  5.7   Creatinine 0.57 - 1.00 mg/dL 1.61  0.96  0.45              Latest Ref Rng & Units 05/15/2022   10:54 AM  Metanephrines/Catecholamines   Epinephrine 0 - 62 pg/mL 41   Norepinephrine 0 - 874 pg/mL 1,091   Dopamine 0 - 48 pg/mL 32   Metanephrines 0.0 - 88.0 pg/mL 45.0   Normetanephrines  0.0 - 285.2 pg/mL 130.0           12/20/2021   10:26 AM  Renovascular   Renal Artery US  Completed Yes      Home Medications    Current Outpatient Medications  Medication Sig Dispense Refill   levETIRAcetam  (KEPPRA ) 500 MG tablet Take 1 tablet (500 mg total) by mouth every evening. 90 tablet 4   ACCU-CHEK GUIDE test strip as directed as directed     acetaminophen   (TYLENOL ) 325 MG tablet Take 325-650 mg by mouth every 6 (six) hours as needed.     atorvastatin  (LIPITOR) 40 MG tablet Take 40 mg by mouth every other day.     Blood Glucose Monitoring Suppl (ACCU-CHEK GUIDE) w/Device KIT      Blood Glucose Monitoring Suppl (GLUCOCOM BLOOD GLUCOSE MONITOR) DEVI 1 Device by Misc.(Non-Drug; Combo Route) route as needed (to check blood glucose).     Blood Glucose Monitoring Suppl (ONETOUCH VERIO FLEX SYSTEM) w/Device KIT 1 DEVICE BY MISC.(NON-DRUG COMBO ROUTE) ROUTE AS NEEDED (TO CHECK BLOOD GLUCOSE).     carvedilol  (COREG ) 25 MG tablet TAKE 1 TABLET (25 MG TOTAL) BY MOUTH TWICE A DAY WITH MEALS 180 tablet 1   Continuous Blood Gluc Receiver (DEXCOM G6 RECEIVER) DEVI Use as directed for continuous glucose monitoring.     Continuous Blood Gluc Sensor (DEXCOM G6 SENSOR) MISC Inject 1 sensor to the skin every 10 days for continuous glucose monitoring.     Continuous Blood Gluc Transmit (DEXCOM G6 TRANSMITTER) MISC Use as directed for continuous glucose monitoring. Reuse transmitter for 90 days then discard and replace.     [employer] ASPIRIN  ADULT LOW DOSE 81 MG chewable tablet Chew 81 mg by mouth daily.     diclofenac Sodium (VOLTAREN) 1 % GEL Apply 2 g topically 4 (four) times daily as needed.     Dulaglutide (TRULICITY) 3 MG/0.5ML SOPN Inject 3 mg into the skin every 7 (seven) days.     empagliflozin  (JARDIANCE ) 10 MG TABS tablet Take 1 tablet (10 mg total) by mouth daily before breakfast. 30 tablet 5   famotidine  (PEPCID ) 40 MG tablet Take 40 mg by mouth daily.     glucose blood (ONETOUCH VERIO) test strip To monitor sugar 4 times per day     Insulin  Degludec (TRESIBA) 100 UNIT/ML SOLN Inject 20 Units into the skin every evening. Taking 40 units in the evening     Insulin  Pen Needle (BD PEN NEEDLE NANO U/F) 32G X 4 MM MISC Use once daily for [place] and once weekly for Trulicity     irbesartan  (AVAPRO ) 75 MG tablet Take 1 tablet (75 mg total) by mouth daily.     lamoTRIgine   (LAMICTAL ) 150 MG tablet Take 1 tablet (150 mg total) by mouth daily. 90 tablet 1   Lancets (ONETOUCH DELICA PLUS LANCET30G) MISC 1 DEVICE BY MISC.(NON-DRUG COMBO ROUTE) ROUTE 4 TIMES DAILY.     metFORMIN (GLUCOPHAGE) 500 MG tablet Take 500  mg by mouth 2 (two) times daily.     nitroGLYCERIN  (NITROSTAT ) 0.4 MG SL tablet Place 1 tablet (0.4 mg total) under the tongue every 5 (five) minutes as needed for chest pain. 25 tablet 3   ONETOUCH VERIO test strip 4 (four) times daily.     SIMBRINZA 1-0.2 % SUSP Apply 1 drop to eye 2 (two) times daily.     simvastatin  (ZOCOR ) 40 MG tablet Take 40 mg by mouth daily.     spironolactone  (ALDACTONE ) 25 MG tablet TAKE 1/2 TABLET BY MOUTH EVERY DAY 45 tablet 2   VASCEPA 1 g capsule Take 2 g by mouth daily.     No current facility-administered medications for this visit.     Assessment & Plan     Hypertension Assessment: BP is uncontrolled in office BP 180/90 mmHg;  above the goal (<130/80). Home cuff accurate to within 5 points.   Home readings labile, sometimes with ~ 100 point differences systolic within 24 hour periods Stopped irbesartan , amlodipine  and chlorthalidone  on her own 2/2 hypotension Tolerates carvedilol  and spironolactone  well, without any side effects Denies SOB, palpitation, chest pain, headaches,or swelling  Reiterated the importance of regular exercise and low salt diet   Plan:  Continue taking carvedilol  and spironolactone  Patient to keep record of BP readings with heart rate and report to us  at the next visit - stressed having patient write results on paper, better for us  to try to find a pattern to her readings.   Will check metanephrines and catecholamines to rule out pheochromocytoma Patient to follow up with me in 1 month  Labs ordered today:  catecholamines, metanephrines.    Mikey Maffett PharmD CPP [facility]  [address] [place], [place] [address] [phone]

## 2023-10-28 NOTE — Assessment & Plan Note (Signed)
 Assessment: BP is uncontrolled in office BP 180/90 mmHg;  above the goal (<130/80). Home cuff accurate to within 5 points.   Home readings labile, sometimes with ~ 100 point differences systolic within 24 hour periods Stopped irbesartan , amlodipine  and chlorthalidone  on her own 2/2 hypotension Tolerates carvedilol  and spironolactone  well, without any side effects Denies SOB, palpitation, chest pain, headaches,or swelling  Reiterated the importance of regular exercise and low salt diet   Plan:  Continue taking carvedilol  and spironolactone  Patient to keep record of BP readings with heart rate and report to us  at the next visit - stressed having patient write results on paper, better for us  to try to find a pattern to her readings.   Will check metanephrines and catecholamines to rule out pheochromocytoma Patient to follow up with me in 1 month  Labs ordered today:  catecholamines, metanephrines.

## 2023-10-28 NOTE — Patient Instructions (Signed)
 Follow up appointment: Tuesday July 22 at 11:30 am  Go to the lab in the next few days - fasting (drink a cup of water) and early in the day (8 am is best)  Take your BP meds as follows: continue with carvedilol  twice daily and spironolactone  once daily.    Drink more water when your pressure starts to go down.    Check your blood pressure at home daily and keep record of the readings.  Your blood pressure goal is < 130/80  To check your pressure at home you will need to:  1. Sit up in a chair, with feet flat on the floor and back supported. Do not cross your ankles or legs. 2. Rest your left arm so that the cuff is about heart level. If the cuff goes on your upper arm,  then just relax the arm on the table, arm of the chair or your lap. If you have a wrist cuff, we  suggest relaxing your wrist against your chest (think of it as Pledging the Flag with the  wrong arm).  3. Place the cuff snugly around your arm, about 1 inch above the crook of your elbow. The  cords should be inside the groove of your elbow.  4. Sit quietly, with the cuff in place, for about 5 minutes. After that 5 minutes press the power  button to start a reading. 5. Do not talk or move while the reading is taking place.  6. Record your readings on a sheet of paper. Although most cuffs have a memory, it is often  easier to see a pattern developing when the numbers are all in front of you.  7. You can repeat the reading after 1-3 minutes if it is recommended  Make sure your bladder is empty and you have not had caffeine or tobacco within the last 30 min  Always bring your blood pressure log with you to your appointments. If you have not brought your monitor in to be double checked for accuracy, please bring it to your next appointment.  You can find a list of quality blood pressure cuffs at [email or web address]  Important lifestyle changes to control high blood pressure  Intervention  Effect on the BP  Lose extra pounds  and watch your waistline Weight loss is one of the most effective lifestyle changes for controlling blood pressure. If you're overweight or obese, losing even a small amount of weight can help reduce blood pressure. Blood pressure might go down by about 1 millimeter of mercury (mm Hg) with each kilogram (about 2.2 pounds) of weight lost.  Exercise regularly As a general goal, aim for at least 30 minutes of moderate physical activity every day. Regular physical activity can lower high blood pressure by about 5 to 8 mm Hg.  Eat a healthy diet Eating a diet rich in whole grains, fruits, vegetables, and low-fat dairy products and low in saturated fat and cholesterol. A healthy diet can lower high blood pressure by up to 11 mm Hg.  Reduce salt (sodium) in your diet Even a small reduction of sodium in the diet can improve heart health and reduce high blood pressure by about 5 to 6 mm Hg.  Limit alcohol One drink equals 12 ounces of beer, 5 ounces of wine, or 1.5 ounces of 80-proof liquor.  Limiting alcohol to less than one drink a day for women or two drinks a day for men can help lower blood pressure by about 4 mm Hg.  If you have any questions or concerns please use My Chart to send questions or call the office at [phone]

## 2023-10-31 DIAGNOSIS — I2510 Atherosclerotic heart disease of native coronary artery without angina pectoris: Secondary | ICD-10-CM | POA: Diagnosis not present

## 2023-10-31 DIAGNOSIS — Z7982 Long term (current) use of aspirin: Secondary | ICD-10-CM | POA: Diagnosis not present

## 2023-10-31 DIAGNOSIS — K648 Other hemorrhoids: Secondary | ICD-10-CM | POA: Diagnosis not present

## 2023-10-31 DIAGNOSIS — Z79899 Other long term (current) drug therapy: Secondary | ICD-10-CM | POA: Diagnosis not present

## 2023-10-31 DIAGNOSIS — I110 Hypertensive heart disease with heart failure: Secondary | ICD-10-CM | POA: Diagnosis not present

## 2023-10-31 DIAGNOSIS — Z7985 Long-term (current) use of injectable non-insulin antidiabetic drugs: Secondary | ICD-10-CM | POA: Diagnosis not present

## 2023-10-31 DIAGNOSIS — E119 Type 2 diabetes mellitus without complications: Secondary | ICD-10-CM | POA: Diagnosis not present

## 2023-10-31 DIAGNOSIS — K219 Gastro-esophageal reflux disease without esophagitis: Secondary | ICD-10-CM | POA: Diagnosis not present

## 2023-10-31 DIAGNOSIS — Z7984 Long term (current) use of oral hypoglycemic drugs: Secondary | ICD-10-CM | POA: Diagnosis not present

## 2023-10-31 DIAGNOSIS — G4733 Obstructive sleep apnea (adult) (pediatric): Secondary | ICD-10-CM | POA: Diagnosis not present

## 2023-10-31 DIAGNOSIS — R569 Unspecified convulsions: Secondary | ICD-10-CM | POA: Diagnosis not present

## 2023-10-31 DIAGNOSIS — I252 Old myocardial infarction: Secondary | ICD-10-CM | POA: Diagnosis not present

## 2023-10-31 DIAGNOSIS — I5030 Unspecified diastolic (congestive) heart failure: Secondary | ICD-10-CM | POA: Diagnosis not present

## 2023-10-31 DIAGNOSIS — K5730 Diverticulosis of large intestine without perforation or abscess without bleeding: Secondary | ICD-10-CM | POA: Diagnosis not present

## 2023-10-31 DIAGNOSIS — Z1211 Encounter for screening for malignant neoplasm of colon: Secondary | ICD-10-CM | POA: Diagnosis not present

## 2023-11-03 DIAGNOSIS — I10 Essential (primary) hypertension: Secondary | ICD-10-CM | POA: Diagnosis not present

## 2023-11-13 LAB — METANEPHRINES, PLASMA
Metanephrine, Free: 49.7 pg/mL (ref 0.0–88.0)
Normetanephrine, Free: 102.4 pg/mL (ref 0.0–285.2)

## 2023-11-13 LAB — CATECHOLAMINES, FRACTIONATED, PLASMA
Dopamine: 12.9 pg/mL (ref 0.0–36.7)
Epinephrine: 46 pg/mL (ref 0.0–55.4)
Norepinephrine: 633 pg/mL — ABNORMAL HIGH (ref 115–524)

## 2023-11-14 ENCOUNTER — Ambulatory Visit: Payer: Self-pay | Admitting: Cardiovascular Disease

## 2023-11-18 ENCOUNTER — Telehealth (HOSPITAL_BASED_OUTPATIENT_CLINIC_OR_DEPARTMENT_OTHER): Payer: Self-pay | Admitting: Cardiovascular Disease

## 2023-11-18 NOTE — Telephone Encounter (Signed)
 Results per Dr. Raford,   Catecholamines are mildly elevated. Not high enough to be consistent with a pheochromocytoma causing elevated blood pressures.    Returned call to daughter (ok per dpr), reviewed her results.

## 2023-11-18 NOTE — Telephone Encounter (Signed)
  Daughter is calling to discuss her moms results. Please call

## 2023-12-08 DIAGNOSIS — H40013 Open angle with borderline findings, low risk, bilateral: Secondary | ICD-10-CM | POA: Diagnosis not present

## 2023-12-12 DIAGNOSIS — S161XXA Strain of muscle, fascia and tendon at neck level, initial encounter: Secondary | ICD-10-CM | POA: Diagnosis not present

## 2023-12-12 DIAGNOSIS — M542 Cervicalgia: Secondary | ICD-10-CM | POA: Diagnosis not present

## 2023-12-15 NOTE — Progress Notes (Signed)
 Office Visit    Patient Name: Carolyn Little Date of Encounter: 12/16/2023  Primary Care Provider:  Rosalea Rosina SAILOR, PA Primary Cardiologist:  Oneil Parchment, MD  Chief Complaint    Hypertension - Advanced hypertension clinic  Past Medical History   CHF Chronic diastolic - euvolemic w/o diuretic, on carvedilol , empagliflozin , spironolactone , irbesartan   CAD S/P PCI on simvastatin  40, carvedilol  25  HLD  LDL on simvastatin  40  DM2 3/25 A1c 7.3, on dulagutide, empagliflozin , Tresiba, metformin  CKD IIIa GFR 53  OSA     Allergies  Allergen Reactions   Atorvastatin  Nausea And Vomiting   Levaquin [Levofloxacin] Hives    History of Present Illness    Carolyn Little is a 78 y.o. female patient who was referred to the Advanced Hypertension Clinic by Dr. Catalina.  It is reported that she has had hypertension for many years, but was well controlled on valsartan  and carvedilol .  States that after she moved to [facility] (from [place]), her pressures started increasing, and now they are quite labile, going from 90-172 in the same day.  Denies any major changes in life.   When [place] saw her in May, amlodipine  2.5 mg daily was added.  Since then reports readings fluctuating wildly, and has weaned herself off amlodipine , irbesartan  and chlorthalidone .  When I saw her last month, pressure was 180/84.  Metanephrines and catecholamines were not indicative of pheochromocytoma.  No other indications seen for secondary hypertension.  When I saw her, I asked that she keep a detailed BP log (home cuff validated) so we could better determine any causation for fluctuations.    Today she returns for follow up.  At her last visit she stated was taking carvedilol  and spironolactone , but today she and her daughter note that she has not had spironolactone  since prior to that appointment, as it was causing her BP to drop too low (under 100 systolic).  She reported feeling bad -tired and dizzy- when this would happen.    Explained that because she is still getting readings up to 160 systolic, we need to add something back into her regimen.    Blood Pressure Goal:  130/80  Current Medications: carvedilol  25 mg twice per day, spironolactone  12.5 mg daily,   Previously tried:   valsartan   - unsure why switched to irbesartan   Social Hx:      Tobacco: no  Alcohol: rarely wine  Caffeine:  coffee - in the am 1 only  Diet:    eats breakfast and dinner; diet high in rice and beans; not many meats or vegetables; snakcs on fruit occasionally; uses salt in the kitchen when cooking, but not at table,   Exercise: walking about 30 minutes, most days per week ; limited by rain lately  Home BP readings:  home Omron wrist reads within 5 points     Accessory Clinical Findings    Lab Results  Component Value Date   CREATININE 1.08 (H) 08/26/2023   BUN 18 08/26/2023   NA 142 08/26/2023   K 5.2 08/26/2023   CL 103 08/26/2023   CO2 22 08/26/2023   Lab Results  Component Value Date   ALT 15 07/16/2022   AST 12 (L) 07/16/2022   ALKPHOS 55 07/16/2022   BILITOT 0.6 07/16/2022   No results found for: HGBA1C  Screening for Secondary Hypertension:      05/09/2022    4:46 PM  Causes  Sleep Apnea Screened  Thyroid  Disease Screened  Pheochromocytoma Screened  Cushing's  Syndrome Screened  Coarctation of the Aorta Screened    Relevant Labs/Studies:    Latest Ref Rng & Units 08/26/2023   11:45 AM 08/22/2023   11:43 AM 08/19/2023    1:23 PM  Basic Labs  Sodium 134 - 144 mmol/L 142  150  142   Potassium 3.5 - 5.2 mmol/L 5.2  CANCELED  5.7   Creatinine 0.57 - 1.00 mg/dL 8.91  8.91  8.86              Latest Ref Rng & Units 11/03/2023   10:28 AM 05/15/2022   10:54 AM  Metanephrines/Catecholamines   Epinephrine 0.0 - 55.4 pg/mL 46.0  41   Norepinephrine 115 - 524 pg/mL 633  1,091   Dopamine 0.0 - 36.7 pg/mL 12.9  32   Metanephrines 0.0 - 88.0 pg/mL 49.7  45.0   Normetanephrines  0.0 - 285.2 pg/mL 102.4   130.0           12/20/2021   10:26 AM  Renovascular   Renal Artery US  Completed Yes      Home Medications    Current Outpatient Medications  Medication Sig Dispense Refill   carvedilol  (COREG ) 25 MG tablet TAKE 1 TABLET (25 MG TOTAL) BY MOUTH TWICE A DAY WITH MEALS 180 tablet 1   ACCU-CHEK GUIDE test strip as directed as directed     acetaminophen  (TYLENOL ) 325 MG tablet Take 325-650 mg by mouth every 6 (six) hours as needed.     atorvastatin  (LIPITOR) 40 MG tablet Take 40 mg by mouth every other day.     Blood Glucose Monitoring Suppl (ACCU-CHEK GUIDE) w/Device KIT      Blood Glucose Monitoring Suppl (GLUCOCOM BLOOD GLUCOSE MONITOR) DEVI 1 Device by Misc.(Non-Drug; Combo Route) route as needed (to check blood glucose).     Blood Glucose Monitoring Suppl (ONETOUCH VERIO FLEX SYSTEM) w/Device KIT 1 DEVICE BY MISC.(NON-DRUG COMBO ROUTE) ROUTE AS NEEDED (TO CHECK BLOOD GLUCOSE).     Continuous Blood Gluc Receiver (DEXCOM G6 RECEIVER) DEVI Use as directed for continuous glucose monitoring.     Continuous Blood Gluc Sensor (DEXCOM G6 SENSOR) MISC Inject 1 sensor to the skin every 10 days for continuous glucose monitoring.     Continuous Blood Gluc Transmit (DEXCOM G6 TRANSMITTER) MISC Use as directed for continuous glucose monitoring. Reuse transmitter for 90 days then discard and replace.     [employer]S ASPIRIN  ADULT LOW DOSE 81 MG chewable tablet Chew 81 mg by mouth daily.     diclofenac Sodium (VOLTAREN) 1 % GEL Apply 2 g topically 4 (four) times daily as needed.     Dulaglutide (TRULICITY) 3 MG/0.5ML SOPN Inject 3 mg into the skin every 7 (seven) days.     empagliflozin  (JARDIANCE ) 10 MG TABS tablet Take 1 tablet (10 mg total) by mouth daily before breakfast. 30 tablet 5   famotidine  (PEPCID ) 40 MG tablet Take 40 mg by mouth daily.     glucose blood (ONETOUCH VERIO) test strip To monitor sugar 4 times per day     Insulin  Degludec (TRESIBA) 100 UNIT/ML SOLN Inject 20 Units into the skin every  evening. Taking 40 units in the evening     Insulin  Pen Needle (BD PEN NEEDLE NANO U/F) 32G X 4 MM MISC Use once daily for [place] and once weekly for Trulicity     irbesartan  (AVAPRO ) 75 MG tablet Take 1 tablet (75 mg total) by mouth daily.     lamoTRIgine  (LAMICTAL ) 150 MG tablet Take 1  tablet (150 mg total) by mouth daily. 90 tablet 1   Lancets (ONETOUCH DELICA PLUS LANCET30G) MISC 1 DEVICE BY MISC.(NON-DRUG COMBO ROUTE) ROUTE 4 TIMES DAILY.     levETIRAcetam  (KEPPRA ) 500 MG tablet Take 1 tablet (500 mg total) by mouth every evening. 90 tablet 4   metFORMIN (GLUCOPHAGE) 500 MG tablet Take 500 mg by mouth 2 (two) times daily.     nitroGLYCERIN  (NITROSTAT ) 0.4 MG SL tablet Place 1 tablet (0.4 mg total) under the tongue every 5 (five) minutes as needed for chest pain. 25 tablet 3   ONETOUCH VERIO test strip 4 (four) times daily.     SIMBRINZA 1-0.2 % SUSP Apply 1 drop to eye 2 (two) times daily.     simvastatin  (ZOCOR ) 40 MG tablet Take 40 mg by mouth daily.     spironolactone  (ALDACTONE ) 25 MG tablet TAKE 1/2 TABLET BY MOUTH EVERY DAY 45 tablet 2   VASCEPA 1 g capsule Take 2 g by mouth daily.     No current facility-administered medications for this visit.     Assessment & Plan     Hypertension Assessment: BP is uncontrolled in office BP 162/72 mmHg;  above the goal (<130/80). Home readings average 136/65 in the mornings and 148/70 in the evenings Tolerates carvedilol  well, without any side effects Denies SOB, palpitation, chest pain, headaches,or swelling Patient has not been on spironolactone  since prior to last appointment.  Currently only on carvedilol .  Reiterated the importance of regular exercise and low salt diet   Plan:  Start taking amlodipine  2.5 mg daily at noon Continue taking carvedilol  25 mg twice daily Patient to keep record of BP readings with heart rate and report to us  at the next visit Patient to follow up with me in 2 months  Labs ordered today:   none   Allean Mink PharmD CPP [facility]  [address] [place], [place] [address] [phone]

## 2023-12-16 ENCOUNTER — Ambulatory Visit (HOSPITAL_BASED_OUTPATIENT_CLINIC_OR_DEPARTMENT_OTHER)

## 2023-12-16 ENCOUNTER — Encounter (HOSPITAL_BASED_OUTPATIENT_CLINIC_OR_DEPARTMENT_OTHER): Payer: Self-pay | Admitting: Pharmacist Clinician (PhC)/ Clinical Pharmacy Specialist

## 2023-12-16 VITALS — BP 162/72 | HR 66 | Wt 151.5 lb

## 2023-12-16 DIAGNOSIS — I10 Essential (primary) hypertension: Secondary | ICD-10-CM | POA: Diagnosis not present

## 2023-12-16 NOTE — Patient Instructions (Signed)
 Follow up appointment: September 11 at 10 am  Take your BP meds as follows:  Start amlodipine  2.5 mg once daily at lunchtime  Continue with the carvedilol  25 mg twice daily  Check your blood pressure at home twice daily, 3-5 days per week and keep record of the readings.  Your blood pressure goal is < 130/80  To check your pressure at home you will need to:  1. Sit up in a chair, with feet flat on the floor and back supported. Do not cross your ankles or legs. 2. Rest your left arm so that the cuff is about heart level. If the cuff goes on your upper arm,  then just relax the arm on the table, arm of the chair or your lap. If you have a wrist cuff, we  suggest relaxing your wrist against your chest (think of it as Pledging the Flag with the  wrong arm).  3. Place the cuff snugly around your arm, about 1 inch above the crook of your elbow. The  cords should be inside the groove of your elbow.  4. Sit quietly, with the cuff in place, for about 5 minutes. After that 5 minutes press the power  button to start a reading. 5. Do not talk or move while the reading is taking place.  6. Record your readings on a sheet of paper. Although most cuffs have a memory, it is often  easier to see a pattern developing when the numbers are all in front of you.  7. You can repeat the reading after 1-3 minutes if it is recommended  Make sure your bladder is empty and you have not had caffeine or tobacco within the last 30 min  Always bring your blood pressure log with you to your appointments. If you have not brought your monitor in to be double checked for accuracy, please bring it to your next appointment.  You can find a list of quality blood pressure cuffs at [email or web address]  Important lifestyle changes to control high blood pressure  Intervention  Effect on the BP  Lose extra pounds and watch your waistline Weight loss is one of the most effective lifestyle changes for controlling blood pressure.  If you're overweight or obese, losing even a small amount of weight can help reduce blood pressure. Blood pressure might go down by about 1 millimeter of mercury (mm Hg) with each kilogram (about 2.2 pounds) of weight lost.  Exercise regularly As a general goal, aim for at least 30 minutes of moderate physical activity every day. Regular physical activity can lower high blood pressure by about 5 to 8 mm Hg.  Eat a healthy diet Eating a diet rich in whole grains, fruits, vegetables, and low-fat dairy products and low in saturated fat and cholesterol. A healthy diet can lower high blood pressure by up to 11 mm Hg.  Reduce salt (sodium) in your diet Even a small reduction of sodium in the diet can improve heart health and reduce high blood pressure by about 5 to 6 mm Hg.  Limit alcohol One drink equals 12 ounces of beer, 5 ounces of wine, or 1.5 ounces of 80-proof liquor.  Limiting alcohol to less than one drink a day for women or two drinks a day for men can help lower blood pressure by about 4 mm Hg.   If you have any questions or concerns please use My Chart to send questions or call the office at [phone]

## 2023-12-16 NOTE — Assessment & Plan Note (Signed)
 Assessment: BP is uncontrolled in office BP 162/72 mmHg;  above the goal (<130/80). Home readings average 136/65 in the mornings and 148/70 in the evenings Tolerates carvedilol  well, without any side effects Denies SOB, palpitation, chest pain, headaches,or swelling Patient has not been on spironolactone  since prior to last appointment.  Currently only on carvedilol .  Reiterated the importance of regular exercise and low salt diet   Plan:  Start taking amlodipine  2.5 mg daily at noon Continue taking carvedilol  25 mg twice daily Patient to keep record of BP readings with heart rate and report to us  at the next visit Patient to follow up with me in 2 months  Labs ordered today:  none

## 2023-12-18 DIAGNOSIS — R011 Cardiac murmur, unspecified: Secondary | ICD-10-CM | POA: Diagnosis not present

## 2023-12-18 DIAGNOSIS — M19011 Primary osteoarthritis, right shoulder: Secondary | ICD-10-CM | POA: Diagnosis not present

## 2023-12-18 DIAGNOSIS — K219 Gastro-esophageal reflux disease without esophagitis: Secondary | ICD-10-CM | POA: Diagnosis not present

## 2023-12-18 DIAGNOSIS — I10 Essential (primary) hypertension: Secondary | ICD-10-CM | POA: Diagnosis not present

## 2023-12-18 DIAGNOSIS — E782 Mixed hyperlipidemia: Secondary | ICD-10-CM | POA: Diagnosis not present

## 2023-12-18 DIAGNOSIS — Z794 Long term (current) use of insulin: Secondary | ICD-10-CM | POA: Diagnosis not present

## 2023-12-18 DIAGNOSIS — E559 Vitamin D deficiency, unspecified: Secondary | ICD-10-CM | POA: Diagnosis not present

## 2023-12-18 DIAGNOSIS — E1165 Type 2 diabetes mellitus with hyperglycemia: Secondary | ICD-10-CM | POA: Diagnosis not present

## 2023-12-23 DIAGNOSIS — Z794 Long term (current) use of insulin: Secondary | ICD-10-CM | POA: Diagnosis not present

## 2023-12-23 DIAGNOSIS — E1165 Type 2 diabetes mellitus with hyperglycemia: Secondary | ICD-10-CM | POA: Diagnosis not present

## 2024-01-02 DIAGNOSIS — M542 Cervicalgia: Secondary | ICD-10-CM | POA: Diagnosis not present

## 2024-01-02 DIAGNOSIS — M25512 Pain in left shoulder: Secondary | ICD-10-CM | POA: Diagnosis not present

## 2024-01-03 DIAGNOSIS — Z1231 Encounter for screening mammogram for malignant neoplasm of breast: Secondary | ICD-10-CM | POA: Diagnosis not present

## 2024-01-11 ENCOUNTER — Other Ambulatory Visit (HOSPITAL_BASED_OUTPATIENT_CLINIC_OR_DEPARTMENT_OTHER): Payer: Self-pay | Admitting: Family

## 2024-01-11 DIAGNOSIS — I5032 Chronic diastolic (congestive) heart failure: Secondary | ICD-10-CM

## 2024-01-13 ENCOUNTER — Ambulatory Visit: Payer: 59 | Admitting: Diagnostic Neuroimaging

## 2024-01-27 ENCOUNTER — Other Ambulatory Visit: Payer: Self-pay | Admitting: Physician Assistant

## 2024-01-27 MED ORDER — CARVEDILOL 25 MG PO TABS: 25.0000 mg | ORAL_TABLET | Freq: Two times a day (BID) | ORAL | 3 refills | Status: DC

## 2024-01-28 ENCOUNTER — Other Ambulatory Visit (HOSPITAL_BASED_OUTPATIENT_CLINIC_OR_DEPARTMENT_OTHER): Payer: Self-pay | Admitting: Cardiovascular Disease

## 2024-01-29 NOTE — Progress Notes (Signed)
 [facility]  PATIENT: Carolyn Little DOB: 09/14/1945  REFERRING CLINICIAN: Rosalea Rosina SAILOR, PA HISTORY FROM: patient and daughter REASON FOR VISIT: follow up   HISTORICAL  CHIEF COMPLAINT:  Chief Complaint  Patient presents with   Seizures    Rm 8 with daughter Bartholome and [facility] Interpreter Pt is well and stable, reports no seizures since last visit.  She does mention pain in head from tumor surgery in 84 and is req MRI.     HISTORY OF PRESENT ILLNESS:    Update 02/02/2024 JM: Patient returns for yearly seizure follow-up accompanied by her daughter and [facility] interpreter.  Has been stable over the past year without recurrent seizure activity.  Remains on levetiracetam  and lamotrigine  without side effects.  Reports routine lab work with PCP.  Patient also mentions onset of left-sided headaches in July, primarily with tenderness over site of surgery from acoustic neuroma resection. Describes as pain in left occipital area and radiates to shoulder. Denies symptoms radiating down arm. Does have chronic bilateral shoulder pain.  She was seen at an urgent care clinic in July, c-spine xray showed no acute findings and was prescribed tizanidine for suspected cervical strain.  She is concerned the symptoms are related to her prior acoustic neuroma.  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  She denies any changes with these chronic symptoms or any other new neurological symptoms.  Daughter also mentions concerns of memory loss although mentions this to interpreter as patient gets upset when daughter mentions this. Daughter feels gradually worsening. Patient lives alone but daughter visits her daily. She does have aide assistance daily to help with ADLs but more so due to chronic pain.  Daughter does assist with medications and bill paying as needed.  Patient denies any specific memory concerns. Per daughter, she will easily misplace items in her  home, will have difficulty remembering appointments or can easily mix appointments up. Reports her mother had dementia but this was after contracting COVID at 78 years old. No issues with memory prior.       UPDATE (01/13/23, VRP): Since last visit, doing well. Symptoms are stable. No further seizures since ~2018. Tolerating meds.    UPDATE (06/26/21, VRP): Since last visit, doing well. No seizures. Tolerating meds. Actually pt was only taking levetiracetam  and lamictal  once daily each (not twice a day).   PRIOR HPI (02/01/20): 78 year old female here for evaluation of seizure disorder.  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  Around 2008 patient had onset of staring spells and was diagnosed with temporal lobe epilepsy.  No convulsive seizures.  She was started on lamotrigine  and then Keppra  was added on.  Since 2010 patient did well did not have any major seizures.  She may have had a breakthrough seizure in 2018 but unclear.  Since that time doing well on lamotrigine  and Keppra .  Daughter notes some memory loss and forgetfulness for the past 1 year.  No change in ADLs.  Patient used to live in [place]  and moved to [place]  in June 2021.   REVIEW OF SYSTEMS: Full 14 system review of systems performed and negative with exception of: As per HPI.  ALLERGIES: Allergies  Allergen Reactions   Atorvastatin  Nausea And Vomiting   Levaquin [Levofloxacin] Hives    HOME MEDICATIONS: Outpatient Medications Prior to Visit  Medication Sig Dispense Refill   ACCU-CHEK GUIDE test  strip as directed as directed     acetaminophen  (TYLENOL ) 325 MG tablet Take 325-650 mg by mouth every 6 (six) hours as needed.     atorvastatin  (LIPITOR) 40 MG tablet Take 40 mg by mouth every other day.     Blood Glucose Monitoring Suppl (ACCU-CHEK GUIDE) w/Device KIT      Blood Glucose Monitoring Suppl  (GLUCOCOM BLOOD GLUCOSE MONITOR) DEVI 1 Device by Misc.(Non-Drug; Combo Route) route as needed (to check blood glucose).     Blood Glucose Monitoring Suppl (ONETOUCH VERIO FLEX SYSTEM) w/Device KIT 1 DEVICE BY MISC.(NON-DRUG COMBO ROUTE) ROUTE AS NEEDED (TO CHECK BLOOD GLUCOSE).     carvedilol  (COREG ) 25 MG tablet TAKE 1 TABLET (25 MG TOTAL) BY MOUTH TWICE A DAY WITH MEALS 180 tablet 1   [facility] ASPIRIN  ADULT LOW DOSE 81 MG chewable tablet Chew 81 mg by mouth daily.     diclofenac Sodium (VOLTAREN) 1 % GEL Apply 2 g topically 4 (four) times daily as needed.     Dulaglutide (TRULICITY) 3 MG/0.5ML SOPN Inject 3 mg into the skin every 7 (seven) days.     empagliflozin  (JARDIANCE ) 10 MG TABS tablet TAKE 1 TABLET BY MOUTH DAILY BEFORE BREAKFAST. 90 tablet 2   famotidine  (PEPCID ) 40 MG tablet Take 40 mg by mouth daily.     glucose blood (ONETOUCH VERIO) test strip To monitor sugar 4 times per day     Insulin  Degludec (TRESIBA) 100 UNIT/ML SOLN Inject 20 Units into the skin every evening. Taking 40 units in the evening     Insulin  Pen Needle (BD PEN NEEDLE NANO U/F) 32G X 4 MM MISC Use once daily for [place] and once weekly for Trulicity     irbesartan  (AVAPRO ) 75 MG tablet Take 1 tablet (75 mg total) by mouth daily.     Lancets (ONETOUCH DELICA PLUS LANCET30G) MISC 1 DEVICE BY MISC.(NON-DRUG COMBO ROUTE) ROUTE 4 TIMES DAILY.     metFORMIN (GLUCOPHAGE) 500 MG tablet Take 500 mg by mouth 2 (two) times daily.     nitroGLYCERIN  (NITROSTAT ) 0.4 MG SL tablet Place 1 tablet (0.4 mg total) under the tongue every 5 (five) minutes as needed for chest pain. 25 tablet 3   ONETOUCH VERIO test strip 4 (four) times daily.     SIMBRINZA 1-0.2 % SUSP Apply 1 drop to eye 2 (two) times daily.     simvastatin  (ZOCOR ) 40 MG tablet Take 40 mg by mouth daily.     TRESIBA FLEXTOUCH 200 UNIT/ML FlexTouch Pen SMARTSIG:40 Unit(s) SUB-Q Daily     VASCEPA 1 g capsule Take 2 g by mouth daily.     lamoTRIgine  (LAMICTAL ) 150 MG tablet  Take 1 tablet (150 mg total) by mouth daily. 90 tablet 1   levETIRAcetam  (KEPPRA ) 500 MG tablet Take 1 tablet (500 mg total) by mouth every evening. 90 tablet 4   Continuous Blood Gluc Receiver (DEXCOM G6 RECEIVER) DEVI Use as directed for continuous glucose monitoring. (Patient not taking: Reported on 02/02/2024)     Continuous Blood Gluc Sensor (DEXCOM G6 SENSOR) MISC Inject 1 sensor to the skin every 10 days for continuous glucose monitoring. (Patient not taking: Reported on 02/02/2024)     Continuous Blood Gluc Transmit (DEXCOM G6 TRANSMITTER) MISC Use as directed for continuous glucose monitoring. Reuse transmitter for 90 days then discard and replace. (Patient not taking: Reported on 02/02/2024)     spironolactone  (ALDACTONE ) 25 MG tablet TAKE 1/2 TABLET BY MOUTH EVERY DAY (Patient not taking: Reported on  02/02/2024) 45 tablet 2   No facility-administered medications prior to visit.    PAST MEDICAL HISTORY: Past Medical History:  Diagnosis Date   Arthritis    Dizziness    DM (diabetes mellitus) (HCC)    Generalized headaches    Hearing loss    High cholesterol    Hypertension    Low back pain    Obesity    OSA (obstructive sleep apnea) 06/18/2022   Renal insufficiency    Seizure disorder (HCC)     PAST SURGICAL HISTORY: Past Surgical History:  Procedure Laterality Date   BACK SURGERY     BRAIN TUMOR EXCISION  1984   HAND SURGERY     LAPAROSCOPIC HYSTERECTOMY     SHOULDER SURGERY      FAMILY HISTORY: History reviewed. No pertinent family history.  SOCIAL HISTORY: Social History   Socioeconomic History   Marital status: Widowed    Spouse name: Not on file   Number of children: 4   Years of education: Not on file   Highest education level: Not on file  Occupational History   Not on file  Tobacco Use   Smoking status: Never   Smokeless tobacco: Never  Vaping Use   Vaping status: Never Used  Substance and Sexual Activity   Alcohol use: Not Currently   Drug use:  Never   Sexual activity: Not on file  Other Topics Concern   Not on file  Social History Narrative   Lives alone   Social Drivers of Health   Financial Resource Strain: Low Risk  (04/30/2023)   Received from [facility]   Overall Financial Resource Strain (CARDIA)    Difficulty of Paying Living Expenses: Not very hard  Food Insecurity: Low Risk  (12/23/2023)   Received from [facility]   Hunger Vital Sign    Within the past 12 months, you worried that your food would run out before you got money to buy more: Never true    Within the past 12 months, the food you bought just didn't last and you didn't have money to get more. : Never true  Transportation Needs: No Transportation Needs (12/23/2023)   Received from [facility]    In the past 12 months, has lack of reliable transportation kept you from medical appointments, meetings, work or from getting things needed for daily living? : No  Physical Activity: Unknown (04/30/2023)   Received from [facility]   Exercise Vital Sign    On average, how many days per week do you engage in moderate to strenuous exercise (like a brisk walk)?: 0 days    Minutes of Exercise per Session: Not on file  Stress: No Stress Concern Present (10/31/2023)   Received from [facility] - Occupational Stress Questionnaire    Feeling of Stress : Not at all  Social Connections: Somewhat Isolated (04/30/2023)   Received from [facility]   Social Network    How would you rate your social network (family, work, friends)?: Restricted participation with some degree of social isolation  Intimate Partner Violence: Not At Risk (10/31/2023)   Received from [facility]   HITS    Over the last 12 months how often did your partner physically hurt you?: Never    Over the last 12 months how often did your partner insult you or talk down to you?: Never    Over the last 12 months how often did your partner  threaten you with physical harm?: Never    Over the last 12 months how often did your partner scream or curse at you?: Never     PHYSICAL EXAM  GENERAL EXAM/CONSTITUTIONAL: Vitals:  Vitals:   02/02/24 1413  BP: 128/69  Pulse: 89  Weight: 152 lb (68.9 kg)  Height: 4' 11 (1.499 m)   Patient is in no distress; well developed, nourished and groomed; neck is supple  MUSCULOSKELETAL: Gait, strength, tone, movements noted in Neurologic exam below. Some decreased cervical ROM from side to side  NEUROLOGIC: MENTAL STATUS:  awake, alert, oriented to person, place and time recent memory mildly impaired and remote memory intact Recall 2/3, unable to do serial addition (reports always bad at math) normal attention and concentration Spanish-speaking, denies dysarthria or aphasia, naming intact fund of knowledge appropriate  CRANIAL NERVE:  2nd - no papilledema on fundoscopic exam 2nd, 3rd, 4th, 6th - pupils equal and reactive to light, visual fields full to confrontation, extraocular muscles intact, no nystagmus 5th - facial sensation symmetric 7th - facial strength --> LEFT UPPER AND LEFT LOWER FACIAL WEAKNESS 8th - hearing --> DECR IN LEFT 9th - palate elevates symmetrically, uvula midline 11th - shoulder shrug symmetric 12th - tongue protrusion midline  MOTOR:  normal bulk and tone, full strength in the BUE, BLE although give away weakness noted in upper extremities due to shoulder pain  SENSORY:  normal and symmetric to light touch  COORDINATION:  finger-nose-finger, fine finger movements normal  REFLEXES:  deep tendon reflexes 1+ and symmetric  GAIT/STATION:  narrow based gait     DIAGNOSTIC DATA (LABS, IMAGING, TESTING) - I reviewed patient records, labs, notes, testing and imaging myself where available.  Lab Results  Component Value Date   WBC 6.5 07/18/2022   HGB 9.6 (L) 07/18/2022   HCT 30.1 (L) 07/18/2022   MCV 83.1 07/18/2022   PLT 266 07/18/2022       Component Value Date/Time   NA 142 08/26/2023 1145   K 5.2 08/26/2023 1145   CL 103 08/26/2023 1145   CO2 22 08/26/2023 1145   GLUCOSE 215 (H) 08/26/2023 1145   GLUCOSE 245 (H) 07/18/2022 0054   BUN 18 08/26/2023 1145   CREATININE 1.08 (H) 08/26/2023 1145   CALCIUM  9.7 08/26/2023 1145   PROT 7.3 07/16/2022 1755   ALBUMIN 4.2 07/16/2022 1755   AST 12 (L) 07/16/2022 1755   ALT 15 07/16/2022 1755   ALKPHOS 55 07/16/2022 1755   BILITOT 0.6 07/16/2022 1755   GFRNONAA 52 (L) 07/18/2022 0054   No results found for: CHOL, HDL, LDLCALC, LDLDIRECT, TRIG, CHOLHDL No results found for: YHAJ8R No results found for: VITAMINB12 No results found for: TSH       ASSESSMENT AND PLAN  78 y.o. year old female here with history of seizures since 2008, with last seizures in 2010 and possibly 2018.  Stable on medication.  Also history of left acoustic neuroma status post resection 1984, stable.  Has been experiencing left-sided cervical and occipital pain over the past 2 months and daughter reports progression of memory loss.  Dx:  1. Temporal lobe epilepsy (HCC)   2. Memory loss   3. Cervicalgia   4. S/P excision of acoustic neuroma       PLAN:  Temporal lobe epilepsy (stable doses from [place]; was only on 1 tab daily each for the meds, and stable; last seizure ~2018) - continue lamotrigine  150mg  in AM - continue levetiracetam  500mg  in PM  Memory  loss - reported memory issues in 2021 when initially seen by Dr. Margaret  - patient denies memory concerns but daughter believes gradual progression  - recommend doing memory compensation strategies at home as well as ensuring routine physical and cognitive exercises, ensuring good sleep and healthy diet - will f/u with Dr. Margaret to see if imaging needed at this time  Left sided headache Cervicalgia Hx of acoustic neuroma - onset of left sided symptoms over the past 2 months  - s/p resection in 1984 - daughter is  requesting updated imaging. Will f/u with Dr. Margaret regarding need/indication of repeat imaging  - suspect more musculoskeletal related and recommend f/u with PCP, she also has chronic shoulder pain which may be contributing, may need to consider PT but declines interest at this time     Meds ordered this encounter  Medications   lamoTRIgine  (LAMICTAL ) 150 MG tablet    Sig: Take 1 tablet (150 mg total) by mouth daily.    Dispense:  90 tablet    Refill:  3   levETIRAcetam  (KEPPRA ) 500 MG tablet    Sig: Take 1 tablet (500 mg total) by mouth every evening.    Dispense:  90 tablet    Refill:  3   Return in about 1 year (around 02/01/2025).    I personally spent a total of 45 minutes in the care of the patient today including preparing to see the patient, getting/reviewing separately obtained history, performing a medically appropriate exam/evaluation, counseling and educating, placing orders, referring and communicating with other health care professionals, and documenting clinical information in the EHR.  Harlene Bogaert, AGNP-BC  [facility] [address] [place], [place] [address]  Phone [phone] Fax [phone] Note: This document was prepared with digital dictation and possible smart phrase technology. Any transcriptional errors that result from this process are unintentional.

## 2024-02-02 ENCOUNTER — Ambulatory Visit (INDEPENDENT_AMBULATORY_CARE_PROVIDER_SITE_OTHER): Admitting: Adult Health

## 2024-02-02 ENCOUNTER — Encounter: Payer: Self-pay | Admitting: Adult Health

## 2024-02-02 VITALS — BP 128/69 | HR 89 | Ht 59.0 in | Wt 152.0 lb

## 2024-02-02 DIAGNOSIS — Z86018 Personal history of other benign neoplasm: Secondary | ICD-10-CM

## 2024-02-02 DIAGNOSIS — Z9889 Other specified postprocedural states: Secondary | ICD-10-CM

## 2024-02-02 DIAGNOSIS — R413 Other amnesia: Secondary | ICD-10-CM | POA: Diagnosis not present

## 2024-02-02 DIAGNOSIS — E538 Deficiency of other specified B group vitamins: Secondary | ICD-10-CM

## 2024-02-02 DIAGNOSIS — G40109 Localization-related (focal) (partial) symptomatic epilepsy and epileptic syndromes with simple partial seizures, not intractable, without status epilepticus: Secondary | ICD-10-CM | POA: Diagnosis not present

## 2024-02-02 DIAGNOSIS — M542 Cervicalgia: Secondary | ICD-10-CM | POA: Diagnosis not present

## 2024-02-02 MED ORDER — LAMOTRIGINE 150 MG PO TABS: 150.0000 mg | ORAL_TABLET | Freq: Every day | ORAL | 3 refills | Status: AC

## 2024-02-02 MED ORDER — LEVETIRACETAM 500 MG PO TABS: 500.0000 mg | ORAL_TABLET | Freq: Every evening | ORAL | 3 refills | Status: AC

## 2024-02-02 NOTE — Patient Instructions (Addendum)
 Your Plan:  Continue keppra  and lamictal  at current dosages   Will follow up with Dr. Margaret regarding repeat imaging   Please follow up with your PCP regarding neck and shoulder pain. May need to consider doing PT or being seen by orthopedics    Follow up in 1 year with Dr. Margaret or call earlier if needed     Thank you for coming to see us  at [facility]. I hope we have been able to provide you high quality care today.  You may receive a patient satisfaction survey over the next few weeks. We would appreciate your feedback and comments so that we may continue to improve ourselves and the health of our patients.

## 2024-02-04 NOTE — Progress Notes (Signed)
 Office Visit    Patient Name: Carolyn Little Date of Encounter: 02/05/2024  Primary Care Provider:  Rosalea Rosina SAILOR, PA Primary Cardiologist:  Oneil Parchment, MD  Chief Complaint    Hypertension - Advanced hypertension clinic  Past Medical History   CHF Chronic diastolic - euvolemic w/o diuretic, on carvedilol , empagliflozin , spironolactone , irbesartan   CAD S/P PCI on simvastatin  40, carvedilol  25  HLD  LDL on simvastatin  40  DM2 3/25 A1c 7.3, on dulagutide, empagliflozin , Tresiba, metformin  CKD IIIa GFR 53  OSA     Allergies  Allergen Reactions   Atorvastatin  Nausea And Vomiting   Levaquin [Levofloxacin] Hives    History of Present Illness    Carolyn Little is a 78 y.o. female patient who was referred to the Advanced Hypertension Clinic by Dr. Catalina.  It is reported that she has had hypertension for many years, but was well controlled on valsartan  and carvedilol .  States that after she moved to [facility] (from [place]), her pressures started increasing, and now they are quite labile, going from 90-172 in the same day.  Denies any major changes in life.   When Lyons saw her in May, amlodipine  2.5 mg daily was added.  Since then reports readings fluctuating wildly, and has weaned herself off amlodipine , irbesartan  and chlorthalidone .  Metanephrines and catecholamines were not indicative of pheochromocytoma.  No other indications seen for secondary hypertension.  She brought her home Omron wrist cuff, which read within 5 points of the office device.  At her last visit we added in amlodipine  2.5 mg, as home readings were still elevating to 160.  She was not taking spironolactone , as it was causing hypotension.    Today she returns for follow up.  Had episode of swelling last week, lasted 3-4 days, states it was same throughout day/night, did not get better overnight.  Also states she has had dizziness with low systolic pressures, down to 94-100.  Tells me (through daughter who is translating)  that she would hold off on morning carvedilol  and amlodipine  for a few hours until pressure started to come back up.  Later in the conversation they note that it hasn't happened in awhile.     Blood Pressure Goal:  130/80  Current Medications: carvedilol  25 mg twice per day, amlodipine  2.5 mg daily,   Previously tried:   valsartan   - unsure why switched to irbesartan   Social Hx:      Tobacco: no  Alcohol: rarely wine  Caffeine:  coffee - in the am 1 only  Diet:  eats breakfast and dinner; diet high in rice and beans; not many meats or vegetables; snakcs on fruit occasionally; uses salt in the kitchen when cooking, but not at table,   Exercise: walking about 60 minutes, most days per week ; limited by rain lately  Home BP readings:  home Omron wrist reads within 5 points. No readings today as we didn't give her a log sheet at the last visit.     Accessory Clinical Findings    Lab Results  Component Value Date   CREATININE 1.08 (H) 08/26/2023   BUN 18 08/26/2023   NA 142 08/26/2023   K 5.2 08/26/2023   CL 103 08/26/2023   CO2 22 08/26/2023   Lab Results  Component Value Date   ALT 15 07/16/2022   AST 12 (L) 07/16/2022   ALKPHOS 55 07/16/2022   BILITOT 0.6 07/16/2022   No results found for: HGBA1C  Screening for Secondary Hypertension:  05/09/2022    4:46 PM  Causes  Sleep Apnea Screened  Thyroid  Disease Screened  Pheochromocytoma Screened  Cushing's Syndrome Screened  Coarctation of the Aorta Screened    Relevant Labs/Studies:    Latest Ref Rng & Units 08/26/2023   11:45 AM 08/22/2023   11:43 AM 08/19/2023    1:23 PM  Basic Labs  Sodium 134 - 144 mmol/L 142  150  142   Potassium 3.5 - 5.2 mmol/L 5.2  CANCELED  5.7   Creatinine 0.57 - 1.00 mg/dL 8.91  8.91  8.86              Latest Ref Rng & Units 11/03/2023   10:28 AM 05/15/2022   10:54 AM  Metanephrines/Catecholamines   Epinephrine 0.0 - 55.4 pg/mL 46.0  41   Norepinephrine 115 - 524 pg/mL 633   1,091   Dopamine 0.0 - 36.7 pg/mL 12.9  32   Metanephrines 0.0 - 88.0 pg/mL 49.7  45.0   Normetanephrines  0.0 - 285.2 pg/mL 102.4  130.0           12/20/2021   10:26 AM  Renovascular   Renal Artery US  Completed Yes      Home Medications    Current Outpatient Medications  Medication Sig Dispense Refill   amLODipine  (NORVASC ) 2.5 MG tablet Take 1 tablet (2.5 mg total) by mouth daily. 90 tablet 3   ACCU-CHEK GUIDE test strip as directed as directed     acetaminophen  (TYLENOL ) 325 MG tablet Take 325-650 mg by mouth every 6 (six) hours as needed.     atorvastatin  (LIPITOR) 40 MG tablet Take 40 mg by mouth every other day.     Blood Glucose Monitoring Suppl (ACCU-CHEK GUIDE) w/Device KIT      Blood Glucose Monitoring Suppl (GLUCOCOM BLOOD GLUCOSE MONITOR) DEVI 1 Device by Misc.(Non-Drug; Combo Route) route as needed (to check blood glucose).     Blood Glucose Monitoring Suppl (ONETOUCH VERIO FLEX SYSTEM) w/Device KIT 1 DEVICE BY MISC.(NON-DRUG COMBO ROUTE) ROUTE AS NEEDED (TO CHECK BLOOD GLUCOSE).     carvedilol  (COREG ) 25 MG tablet TAKE 1 TABLET (25 MG TOTAL) BY MOUTH TWICE A DAY WITH MEALS 180 tablet 1   CVS ASPIRIN  ADULT LOW DOSE 81 MG chewable tablet Chew 81 mg by mouth daily.     diclofenac Sodium (VOLTAREN) 1 % GEL Apply 2 g topically 4 (four) times daily as needed.     Dulaglutide (TRULICITY) 3 MG/0.5ML SOPN Inject 3 mg into the skin every 7 (seven) days.     empagliflozin  (JARDIANCE ) 10 MG TABS tablet TAKE 1 TABLET BY MOUTH DAILY BEFORE BREAKFAST. 90 tablet 2   famotidine  (PEPCID ) 40 MG tablet Take 40 mg by mouth daily.     glucose blood (ONETOUCH VERIO) test strip To monitor sugar 4 times per day     Insulin  Degludec (TRESIBA) 100 UNIT/ML SOLN Inject 20 Units into the skin every evening. Taking 40 units in the evening     Insulin  Pen Needle (BD PEN NEEDLE NANO U/F) 32G X 4 MM MISC Use once daily for [place] and once weekly for Trulicity     lamoTRIgine  (LAMICTAL ) 150 MG tablet  Take 1 tablet (150 mg total) by mouth daily. 90 tablet 3   Lancets (ONETOUCH DELICA PLUS LANCET30G) MISC 1 DEVICE BY MISC.(NON-DRUG COMBO ROUTE) ROUTE 4 TIMES DAILY.     levETIRAcetam  (KEPPRA ) 500 MG tablet Take 1 tablet (500 mg total) by mouth every evening. 90 tablet 3   metFORMIN (  GLUCOPHAGE) 500 MG tablet Take 500 mg by mouth 2 (two) times daily.     nitroGLYCERIN  (NITROSTAT ) 0.4 MG SL tablet Place 1 tablet (0.4 mg total) under the tongue every 5 (five) minutes as needed for chest pain. 25 tablet 3   ONETOUCH VERIO test strip 4 (four) times daily.     SIMBRINZA 1-0.2 % SUSP Apply 1 drop to eye 2 (two) times daily.     simvastatin  (ZOCOR ) 40 MG tablet Take 40 mg by mouth daily.     TRESIBA FLEXTOUCH 200 UNIT/ML FlexTouch Pen SMARTSIG:40 Unit(s) SUB-Q Daily     VASCEPA 1 g capsule Take 2 g by mouth daily.     No current facility-administered medications for this visit.     Assessment & Plan    Hypertension Assessment: BP is uncontrolled in office BP 142/58 mmHg;  above the goal (<130/80). Unclear about home readings, state anywhere from 94-200 systolic Tolerates amlodipine  and carvedilol  well, without any side effects Denies SOB, palpitation, chest pain, headaches,or swelling Reiterated the importance of regular exercise and low salt diet   Plan:  Continue taking carvedilol  25 mg twice per day, amlodipine  2.5 mg daily Patient to keep record of BP readings with heart rate and report to us  at the next visit Patient to follow up with Dr. Raford in December  Labs ordered today:  none   Allean Mink PharmD CPP [facility]  [address] [place], [place] [address] [phone]

## 2024-02-05 ENCOUNTER — Telehealth: Payer: Self-pay | Admitting: Adult Health

## 2024-02-05 ENCOUNTER — Ambulatory Visit (INDEPENDENT_AMBULATORY_CARE_PROVIDER_SITE_OTHER): Admitting: Pharmacist Clinician (PhC)/ Clinical Pharmacy Specialist

## 2024-02-05 ENCOUNTER — Encounter (HOSPITAL_BASED_OUTPATIENT_CLINIC_OR_DEPARTMENT_OTHER): Payer: Self-pay | Admitting: Pharmacist Clinician (PhC)/ Clinical Pharmacy Specialist

## 2024-02-05 VITALS — BP 142/58 | Wt 152.9 lb

## 2024-02-05 DIAGNOSIS — I10 Essential (primary) hypertension: Secondary | ICD-10-CM

## 2024-02-05 MED ORDER — AMLODIPINE BESYLATE 2.5 MG PO TABS: 2.5000 mg | ORAL_TABLET | Freq: Every day | ORAL | 3 refills | Status: AC

## 2024-02-05 NOTE — Telephone Encounter (Signed)
 no auth required sent to [facility] [phone]

## 2024-02-05 NOTE — Assessment & Plan Note (Addendum)
 Assessment: BP is uncontrolled in office BP 142/58 mmHg;  above the goal (<130/80). Unclear about home readings, state anywhere from 94-200 systolic Tolerates amlodipine  and carvedilol  well, without any side effects Denies SOB, palpitation, chest pain, headaches,or swelling Reiterated the importance of regular exercise and low salt diet   Plan:  Continue taking carvedilol  25 mg twice per day, amlodipine  2.5 mg daily Patient to keep record of BP readings with heart rate and report to us  at the next visit Patient to follow up with Dr. Raford in December  Labs ordered today:  none

## 2024-02-05 NOTE — Addendum Note (Signed)
 Addended by: WHITFIELD RAISIN L on: 02/05/2024 03:28 PM   Modules accepted: Orders

## 2024-02-05 NOTE — Patient Instructions (Addendum)
Follow up appointment: with Dr. Raford on December 1 at 11:15 am  Take your BP meds as follows: continue with carvedilol  twice daily and amlodipine  once daily (can delay amlodipine  dose by a few hours if BP has dropped to < 100)  Check your blood pressure at home daily (if able) and keep record of the readings.  Your blood pressure goal is < 130/80  To check your pressure at home you will need to:  1. Sit up in a chair, with feet flat on the floor and back supported. Do not cross your ankles or legs. 2. Rest your left arm so that the cuff is about heart level. If the cuff goes on your upper arm,  then just relax the arm on the table, arm of the chair or your lap. If you have a wrist cuff, we  suggest relaxing your wrist against your chest (think of it as Pledging the Flag with the  wrong arm).  3. Place the cuff snugly around your arm, about 1 inch above the crook of your elbow. The  cords should be inside the groove of your elbow.  4. Sit quietly, with the cuff in place, for about 5 minutes. After that 5 minutes press the power  button to start a reading. 5. Do not talk or move while the reading is taking place.  6. Record your readings on a sheet of paper. Although most cuffs have a memory, it is often  easier to see a pattern developing when the numbers are all in front of you.  7. You can repeat the reading after 1-3 minutes if it is recommended  Make sure your bladder is empty and you have not had caffeine or tobacco within the last 30 min  Always bring your blood pressure log with you to your appointments. If you have not brought your monitor in to be double checked for accuracy, please bring it to your next appointment.  You can find a list of quality blood pressure cuffs at [email or web address]  Important lifestyle changes to control high blood pressure  Intervention  Effect on the BP  Lose extra pounds and watch your waistline Weight loss is one of the most effective lifestyle  changes for controlling blood pressure. If you're overweight or obese, losing even a small amount of weight can help reduce blood pressure. Blood pressure might go down by about 1 millimeter of mercury (mm Hg) with each kilogram (about 2.2 pounds) of weight lost.  Exercise regularly As a general goal, aim for at least 30 minutes of moderate physical activity every day. Regular physical activity can lower high blood pressure by about 5 to 8 mm Hg.  Eat a healthy diet Eating a diet rich in whole grains, fruits, vegetables, and low-fat dairy products and low in saturated fat and cholesterol. A healthy diet can lower high blood pressure by up to 11 mm Hg.  Reduce salt (sodium) in your diet Even a small reduction of sodium in the diet can improve heart health and reduce high blood pressure by about 5 to 6 mm Hg.  Limit alcohol One drink equals 12 ounces of beer, 5 ounces of wine, or 1.5 ounces of 80-proof liquor.  Limiting alcohol to less than one drink a day for women or two drinks a day for men can help lower blood pressure by about 4 mm Hg.   If you have any questions or concerns please use My Chart to send questions or call the office at [phone]      

## 2024-02-10 ENCOUNTER — Telehealth: Payer: Self-pay

## 2024-02-10 NOTE — Telephone Encounter (Signed)
 Spoke w/Pt daughter to give her provider recommendations. Explained reason for MRI and labs - daughter voiced understanding. MRI is scheduled for Thurs 9/19 and daughter stated she will bring Pt by [facility] to have labs drawn. Daughter voiced thanks for the call.

## 2024-02-13 ENCOUNTER — Other Ambulatory Visit

## 2024-02-18 ENCOUNTER — Other Ambulatory Visit (HOSPITAL_BASED_OUTPATIENT_CLINIC_OR_DEPARTMENT_OTHER): Payer: Self-pay | Admitting: Family

## 2024-02-24 ENCOUNTER — Other Ambulatory Visit

## 2024-02-25 ENCOUNTER — Other Ambulatory Visit

## 2024-02-25 DIAGNOSIS — R413 Other amnesia: Secondary | ICD-10-CM | POA: Diagnosis not present

## 2024-02-25 DIAGNOSIS — E538 Deficiency of other specified B group vitamins: Secondary | ICD-10-CM | POA: Diagnosis not present

## 2024-02-28 LAB — ATN PROFILE
A -- Beta-amyloid 42/40 Ratio: 0.118 (ref >0.102)
Beta-amyloid 40: 181.18 pg/mL
Beta-amyloid 42: 21.38 pg/mL
N -- NfL, Plasma: 2.81 pg/mL (ref 0.00–6.04)
T -- p-tau181: 0.8 pg/mL (ref 0.00–0.97)

## 2024-02-28 LAB — VITAMIN B12: Vitamin B-12: 537 pg/mL (ref 232–1245)

## 2024-03-01 ENCOUNTER — Ambulatory Visit: Payer: Self-pay | Admitting: Adult Health

## 2024-03-02 NOTE — Telephone Encounter (Signed)
 Called daughter/Sonia at [phone]. Relayed results per Harlene note. She verbalized understanding.  States imaging place called and cx 03/04/24 MRI and unsure why. I recommended she call to found out reason and to r/s. She said she just had surgery and will call in a couple days to r/s. I asked she let us  know once she is r/s

## 2024-03-02 NOTE — Telephone Encounter (Signed)
-----   Message from Harlene Bogaert sent at 03/01/2024  2:30 PM EDT ----- Please advise patient/family that recent lab work was not consistent with Alzheimer's disease and B12 levels are satisfactory. She is scheduled for MRI brain 10/9 and will be notified once results  are reviewed. Thank you.  ----- Message ----- From: Rebecka Memos Lab Results In Sent: 02/26/2024   7:37 AM EDT To: Harlene Bogaert, NP

## 2024-03-04 ENCOUNTER — Other Ambulatory Visit

## 2024-03-14 ENCOUNTER — Ambulatory Visit
Admission: RE | Admit: 2024-03-14 | Discharge: 2024-03-14 | Disposition: A | Discharge status: Home / Self Care | Source: Ambulatory Visit | Attending: Adult Health | Admitting: Adult Health

## 2024-03-14 DIAGNOSIS — M542 Cervicalgia: Secondary | ICD-10-CM

## 2024-03-14 DIAGNOSIS — R413 Other amnesia: Secondary | ICD-10-CM | POA: Diagnosis not present

## 2024-03-14 DIAGNOSIS — Z9889 Other specified postprocedural states: Secondary | ICD-10-CM | POA: Diagnosis not present

## 2024-03-14 DIAGNOSIS — Z86018 Personal history of other benign neoplasm: Secondary | ICD-10-CM

## 2024-03-14 MED ORDER — GADOPICLENOL 0.5 MMOL/ML IV SOLN
7.0000 mL | Freq: Once | INTRAVENOUS | Status: AC | PRN
Start: 2024-03-14 — End: 2024-03-14
  Administered 2024-03-14: 7 mL via INTRAVENOUS

## 2024-03-17 NOTE — Telephone Encounter (Signed)
 I called and language interpreter ID [id number] was used intially, but Bartholome, daughter does speak [place].  I gave her the results.  She verbalized understanding.  I answered her questions about SVD.  No neuroma occurrence.  See back in 02-08-2024. They will call back as needed.

## 2024-03-17 NOTE — Telephone Encounter (Signed)
-----   Message from [place] sent at 03/15/2024  4:31 PM EDT ----- Please advise patient/daughter that recent imaging overall looked good without significant concerns, it showed age-related changes of chronic small vessel disease, no evidence of neuroma reoccurrence  and overall stable postoperative changes. No changes in current treatment plan, plan to follow up with Dr. Margaret as scheduled. ----- Message ----- From: Rosemarie Eather RAMAN, MD Sent: 03/14/2024   5:06 PM EDT To: Harlene Bogaert, NP

## 2024-03-25 DIAGNOSIS — I10 Essential (primary) hypertension: Secondary | ICD-10-CM | POA: Diagnosis not present

## 2024-03-25 DIAGNOSIS — E1165 Type 2 diabetes mellitus with hyperglycemia: Secondary | ICD-10-CM | POA: Diagnosis not present

## 2024-03-25 DIAGNOSIS — E782 Mixed hyperlipidemia: Secondary | ICD-10-CM | POA: Diagnosis not present

## 2024-03-25 DIAGNOSIS — Z794 Long term (current) use of insulin: Secondary | ICD-10-CM | POA: Diagnosis not present

## 2024-03-25 DIAGNOSIS — E559 Vitamin D deficiency, unspecified: Secondary | ICD-10-CM | POA: Diagnosis not present

## 2024-03-25 DIAGNOSIS — Z0000 Encounter for general adult medical examination without abnormal findings: Secondary | ICD-10-CM | POA: Diagnosis not present

## 2024-03-25 DIAGNOSIS — K219 Gastro-esophageal reflux disease without esophagitis: Secondary | ICD-10-CM | POA: Diagnosis not present

## 2024-04-06 ENCOUNTER — Telehealth: Payer: Self-pay | Admitting: Cardiovascular Disease

## 2024-04-06 NOTE — Telephone Encounter (Signed)
*  STAT* If patient is at the pharmacy, call can be transferred to refill team.   1. Which medications need to be refilled? (please list name of each medication and dose if known) amLODipine  (NORVASC ) 2.5 MG tablet    2. Would you like to learn more about the convenience, safety, & potential cost savings by using the [facility]?     3. Are you open to using the [facility] (Type [facility]. No   4. Which pharmacy/location (including street and city if local pharmacy) is medication to be sent to? [id number] - [place], [place] - [address]    5. Do they need a 30 day or 90 day supply? 90 day    Pt c/o medication issue:  1. Name of Medication: amLODipine  (NORVASC ) 2.5 MG tablet   2. How are you currently taking this medication (dosage and times per day)?   3. Are you having a reaction (difficulty breathing--STAT)? No   4. What is your medication issue? Pt requesting refill of medication, but says Dr. Raford reduced dosage to 1.5 mg. Pt needs new Rx for 1.5 mg.

## 2024-04-06 NOTE — Telephone Encounter (Signed)
 Returned call to pt's daughter, Bartholome, [place] on file.  Pt had told her that Amlodipine  was reduced to 1.5 mg.  She was advised that per last note, pt should be on 2.5 mg daily and pt is scheduled to see Dr. Raford 04/26/24.  She was advised to call me back if she talked wither her mother and needed more information.

## 2024-04-26 ENCOUNTER — Ambulatory Visit (INDEPENDENT_AMBULATORY_CARE_PROVIDER_SITE_OTHER): Admitting: Cardiovascular Disease

## 2024-04-26 ENCOUNTER — Encounter (HOSPITAL_BASED_OUTPATIENT_CLINIC_OR_DEPARTMENT_OTHER): Payer: Self-pay | Admitting: Cardiovascular Disease

## 2024-04-26 VITALS — BP 164/78 | HR 81 | Ht 59.0 in | Wt 150.8 lb

## 2024-04-26 DIAGNOSIS — I2510 Atherosclerotic heart disease of native coronary artery without angina pectoris: Secondary | ICD-10-CM

## 2024-04-26 DIAGNOSIS — E119 Type 2 diabetes mellitus without complications: Secondary | ICD-10-CM

## 2024-04-26 DIAGNOSIS — I5032 Chronic diastolic (congestive) heart failure: Secondary | ICD-10-CM

## 2024-04-26 DIAGNOSIS — I10 Essential (primary) hypertension: Secondary | ICD-10-CM | POA: Diagnosis not present

## 2024-04-26 DIAGNOSIS — G4733 Obstructive sleep apnea (adult) (pediatric): Secondary | ICD-10-CM | POA: Diagnosis not present

## 2024-04-26 DIAGNOSIS — E782 Mixed hyperlipidemia: Secondary | ICD-10-CM

## 2024-04-26 NOTE — Patient Instructions (Addendum)
 Medication Instructions:  Your physician recommends that you continue on your current medications as directed. Please refer to the Current Medication list given to you today.   Labwork: RENIN/ALDOSTERONE/BMET 1 WEEK PRIOR TO CT   Testing/Procedures: Non-Cardiac CT scanning, (CAT scanning), is a noninvasive, special x-ray that produces cross-sectional images of the body using x-rays and a computer. CT scans help physicians diagnose and treat medical conditions. For some CT exams, a contrast material is used to enhance visibility in the area of the body being studied. CT scans provide greater clarity and reveal more details than regular x-ray exams. CT ABDOMEN AND PELVIS  TO BE DONE AFTER YOU HAVE BEEN APPROVED FOR THE RENAL DENERVATION   Follow-Up: 2 MONTHS WITH [name] W AND 4 MONTHS WITH DR [facility]   If you need a refill on your cardiac medications before your next appointment, please call your pharmacy.

## 2024-04-26 NOTE — Progress Notes (Signed)
 [facility] Follow-up:    Date:  04/26/2024   ID:  Carolyn Little, DOB [birth year], MRN [id number]  PCP:  Rosalea Rosina SAILOR, PA  Cardiologist:  Oneil Parchment, MD   Referring MD: Rosalea Rosina SAILOR, PA   CC: Hypertension  History of Present Illness:    Carolyn Little is a 78 y.o. female with a hx of hypertension, hyperlipidemia, diabetes, OSA not on CPAP, and CAD, here for follow-up. She was initially seen 05/09/2022 by Reche Finder, NP in the [facility]. She has previously worked with Dr. Parchment and self-discontinued valsartan /HCTZ and amlodipine . She was seen in the ED 09/2021 with hypertension and headaches. Echo 12/13/21 LVEF 70-75%. Renal duplex 12/20/21 negative for renal artery stenosis. She has been working with our pharmacist as well and has had labile blood pressures. She was noted to have sleep apnea but not adherent to CPAP. [name] increased her amlodipine  to 2.5 mg twice daily. She repeated her sleep study which confirmed moderate sleep apnea and CPAP was recommended. She had carotid dopplers that revealed moderate stenosis on the right and mild on the left.   At her 05/2022 appointment she was struggling with pain.  Blood pressure continued to be labile but somewhat improved since increasing amlodipine .  Amlodipine  was increased to 10mg .  She was admitted 06/2022 for for pulmonary edema.  Echo at that time revealed LVEF 65% with mild LVH and indeterminate diastolic function.  She was diuresed and blood pressures were controlled in the hospital.  At her visit 12/2022 she reported headache and blood pressure was fairly poorly controlled.  Home blood pressures remained labile.  Her swelling improved off amlodipine .  She had not been using her furosemide  so that was switched to chlorthalidone .  This was subsequently reduced due to increasing creatinine.  She was not sure that she was taking the medicine when she followed up with [name] 12/2022.  She followed up  03/2023 and blood pressures were ranging in the 120s to 150s.  Amlodipine  was increased to 10 mg.  While in [place] she had an ED visit for hypertensive urgency and volume overload.  This was attributed to dietary indiscretion.  Irbesartan  was increased but had to be reduced back to 75 mg due to hyperkalemia.  She was last seen 09/2023 and blood pressures remained labile and uncontrolled.  It was noted that she was taking her blood pressures medicine at different time baseline when she wakes up.  There were also concerns with the pharmacy filling both irbesartan  and valsartan  within the same 30-day period.  Discussed the use of AI scribe software for clinical note transcription with the patient, who gave verbal consent to proceed.  History of Present Illness Carolyn Little is a 78 year old female with hypertension who presents with uncontrolled blood pressure. She is accompanied by her daughter, Carolyn Little.  Her blood pressure has been difficult to control despite previous medication adjustments. She was on irbesartan  until May of this year, but it was discontinued. She reports that we instructed her to stop it but I don't see documentation of this.  She is currently taking medications as listed in her chart, though specific names and dosages were not discussed in detail.  Her blood pressure readings are predominantly high with noted fluctuations. Exercise, such as walking, has been beneficial in managing her blood pressure, but she has reduced her activity due to cold weather. She plans to use her stationary bike at home to maintain physical activity.  She  does not like to go places alone, which limits her access to indoor exercise facilities. She does not feel nervous during medical visits, although her blood pressure readings are often higher in the office.   Previous  antihypertensives: Valsartan -hydrochlorothiazide  Carvedilol  Chlorthalidone  Irbesartan  Spironolactone -hypotension Valsartan benson  Past Medical History:  Diagnosis Date   Arthritis    Dizziness    DM (diabetes mellitus) (HCC)    Generalized headaches    Hearing loss    High cholesterol    Hypertension    Low back pain    Obesity    OSA (obstructive sleep apnea) 06/18/2022   Renal insufficiency    Seizure disorder (HCC)     Past Surgical History:  Procedure Laterality Date   BACK SURGERY     BRAIN TUMOR EXCISION  1984   HAND SURGERY     LAPAROSCOPIC HYSTERECTOMY     SHOULDER SURGERY      Current Medications: No outpatient medications have been marked as taking for the 04/26/24 encounter (Appointment) with Raford Riggs, MD.     Allergies:   Atorvastatin  and Levaquin [levofloxacin]   Social History   Socioeconomic History   Marital status: Widowed    Spouse name: Not on file   Number of children: 4   Years of education: Not on file   Highest education level: Not on file  Occupational History   Not on file  Tobacco Use   Smoking status: Never   Smokeless tobacco: Never  Vaping Use   Vaping status: Never Used  Substance and Sexual Activity   Alcohol use: Not Currently   Drug use: Never   Sexual activity: Not on file  Other Topics Concern   Not on file  Social History Narrative   Lives alone   Social Drivers of Health   Financial Resource Strain: Low Risk  (04/30/2023)   Received from [facility]   Overall Financial Resource Strain (CARDIA)    Difficulty of Paying Living Expenses: Not very hard  Food Insecurity: Low Risk  (12/23/2023)   Received from [facility]   Hunger Vital Sign    Within the past 12 months, you worried that your food would run out before you got money to buy more: Never true    Within the past 12 months, the food you bought just didn't last and you didn't have money to get more. : Never true  Transportation Needs: No  Transportation Needs (12/23/2023)   Received from [facility]    In the past 12 months, has lack of reliable transportation kept you from medical appointments, meetings, work or from getting things needed for daily living? : No  Physical Activity: Unknown (04/30/2023)   Received from [facility]   Exercise Vital Sign    On average, how many days per week do you engage in moderate to strenuous exercise (like a brisk walk)?: 0 days    Minutes of Exercise per Session: Not on file  Stress: No Stress Concern Present (10/31/2023)   Received from [facility] - Occupational Stress Questionnaire    Feeling of Stress : Not at all  Social Connections: Somewhat Isolated (04/30/2023)   Received from [facility]   Social Network    How would you rate your social network (family, work, friends)?: Restricted participation with some degree of social isolation     Family History: The patient's family history is not on file.  ROS:   Please see the history  of present illness.    (+) Recent headache  (+) Chronic shoulder pain All other systems reviewed and are negative.  EKGs/Labs/Other Studies Reviewed:    Echo  07/18/2022:  1. Left ventricular ejection fraction, by estimation, is 65%. The left  ventricle has normal function. The left ventricle has no regional wall  motion abnormalities. There is mild left ventricular hypertrophy. Left  ventricular diastolic parameters are indeterminate.   2. Right ventricular systolic function is normal. The right ventricular  size is normal. Tricuspid regurgitation signal is inadequate for assessing  PA pressure.   3. Left atrial size was mildly dilated.   4. The mitral valve is degenerative. Trivial mitral valve regurgitation.  No evidence of mitral stenosis. Moderate to severe mitral annular  calcification.   5. The aortic valve is grossly normal. There is mild calcification of the  aortic  valve. Aortic valve regurgitation is not visualized. Aortic valve  sclerosis/calcification is present, without any evidence of aortic  stenosis.   6. The inferior vena cava is dilated in size with >50% respiratory  variability, suggesting right atrial pressure of 8 mmHg.   CTA Chest  07/16/2022: IMPRESSION: 1. No pulmonary embolus. 2. Bilateral parahilar ground-glass opacities, likely pulmonary edema. 3. Small pleural effusions.   Aortic Atherosclerosis (ICD10-I70.0).  Bilateral Carotid Dopplers  06/05/2022: Summary:  Right Carotid: Velocities in the right ICA are consistent with a 40-59%                 stenosis. The ECA appears >50% stenosed.   Left Carotid: Velocities in the left ICA are consistent with a 1-39%  stenosis. The ECA appears >50% stenosed.   Vertebrals:  Bilateral vertebral arteries demonstrate antegrade flow.  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 Normal flow  hemodynamics were seen in the left subclavian artery.   Renal Artery Dopplers  7/27/223 Summary:  Renal:    Right: No evidence of right renal artery stenosis. Abnormal size for         the right kidney. Normal cortical thickness of right kidney.         Normal right Resisitive Index.  Left:  No evidence of left renal artery stenosis. LRV flow present.         Normal size of left kidney. Normal left Resistive Index.         Normal cortical thickness of the left kidney.  Mesenteric:  Normal Celiac artery and Superior Mesenteric artery findings. Areas of  limited  visceral study include right renal artery, right kidney size and right  parenchymal flow.   Echo  12/13/2021:  1. Left ventricular ejection fraction, by estimation, is 70 to 75%. Left  ventricular ejection fraction by 3D volume is 71 %. The left ventricle has  hyperdynamic function. The left ventricle has no regional wall motion  abnormalities. There is mild left  ventricular hypertrophy. Left ventricular diastolic parameters are   consistent with Grade I diastolic dysfunction (impaired relaxation). The  average left ventricular global longitudinal strain is -24.4 %. The global  longitudinal strain is normal.   2. Right ventricular systolic function is normal. The right ventricular  size is normal.   3. Left atrial size was moderately dilated.   4. The mitral valve is abnormal. Mild mitral valve regurgitation.  Moderate to severe mitral annular calcification.   5. The aortic valve is tricuspid. Aortic valve regurgitation is not  visualized.   6. The inferior vena cava is normal in size with greater than 50%  respiratory  variability, suggesting right atrial pressure of 3 mmHg.   Comparison(s): Changes from prior study are noted. 01/07/2020: LVEF 55-60%,  grade 2 DD.   Nuclear Stress Myoview   01/07/2020: The left ventricular ejection fraction is normal (55-65%). Nuclear stress EF: 59%. There was no ST segment deviation noted during stress. The study is normal. This is a low risk study.   Normal resting and stress perfusion. No ischemia or infarction EF 59%  EKG:  EKG is personally reviewed. 01/02/2023:  Not ordered. 06/18/2022: EKG was not ordered.  Recent Labs: 08/26/2023: BUN 18; Creatinine, Ser 1.08; Potassium 5.2; Sodium 142   Recent Lipid Panel No results found for: CHOL, TRIG, HDL, CHOLHDL, VLDL, LDLCALC, LDLDIRECT  Physical Exam:    VS:  There were no vitals taken for this visit. , BMI There is no height or weight on file to calculate BMI. GENERAL:  Well appearing HEENT: Pupils equal round and reactive, fundi not visualized, oral mucosa unremarkable NECK:  No jugular venous distention, waveform within normal limits, carotid upstroke brisk and symmetric, no bruits, no thyromegaly LUNGS:  Clear to auscultation bilaterally HEART:  RRR.  PMI not displaced or sustained,S1 and S2 within normal limits, no S3, no S4, no clicks, no rubs, II/VI systolic murmur at the LUSB ABD:  Flat, positive  bowel sounds normal in frequency in pitch, no bruits, no rebound, no guarding, no midline pulsatile mass, no hepatomegaly, no splenomegaly EXT:  2 plus pulses throughout, no edema, no cyanosis, no clubbing SKIN:  No rashes, no nodules NEURO:  Cranial nerves II through XII grossly intact, motor grossly intact throughout PSYCH:  Cognitively intact, oriented to person place and time   ASSESSMENT/PLAN:    Assessment & Plan # Resistant hypertension # HFpEF:  Fluctuating blood pressure despite previous medication adjustments. Renal denervation discussed as a potential treatment to stabilize blood pressure. - Initiated renal denervation procedure approval. - Continue current antihypertensive regimen. Carvedilol  and amlodipine . She hasn't tolerated higher doses or additional medication due to labile BP and hypotension. - Encouraged home exercise, e.g., stationary bike. - Follow-up scheduled for one and three months post-procedure. - Continue Jardiance   # Type 2 diabetes mellitus Managed with medications. Advised on medication adjustments for procedure preparation. - Hold Jardiance  and metformin the night before the procedure.  # Hyperlipidemia:  Continue simvastatin  and Vascepa.    Screening for Secondary Hypertension:     05/09/2022    4:46 PM  Causes  Sleep Apnea Screened  Thyroid  Disease Screened  Pheochromocytoma Screened  Cushing's Syndrome Screened  Coarctation of the Aorta Screened    Relevant Labs/Studies:    Latest Ref Rng & Units 08/26/2023   11:45 AM 08/22/2023   11:43 AM 08/19/2023    1:23 PM  Basic Labs  Sodium 134 - 144 mmol/L 142  150  142   Potassium 3.5 - 5.2 mmol/L 5.2  CANCELED  5.7   Creatinine 0.57 - 1.00 mg/dL 8.91  8.91  8.86              Latest Ref Rng & Units 11/03/2023   10:28 AM 05/15/2022   10:54 AM  Metanephrines/Catecholamines   Epinephrine 0.0 - 55.4 pg/mL 46.0  41   Norepinephrine 115 - 524 pg/mL 633  1,091   Dopamine 0.0 - 36.7 pg/mL 12.9   32   Metanephrines 0.0 - 88.0 pg/mL 49.7  45.0   Normetanephrines  0.0 - 285.2 pg/mL 102.4  130.0           12/20/2021  10:26 AM  Renovascular   Renal Artery US  Completed Yes    Disposition:    FU with Anthem Frazer C. Raford, MD, [facility] in 1-2 months.  Medication Adjustments/Labs and Tests Ordered: Current medicines are reviewed at length with the patient today.  Concerns regarding medicines are outlined above.   No orders of the defined types were placed in this encounter.  No orders of the defined types were placed in this encounter.   Signed, Annabella Raford, MD  04/26/2024 10:16 AM    [facility]

## 2024-06-12 ENCOUNTER — Emergency Department (HOSPITAL_COMMUNITY)

## 2024-06-12 ENCOUNTER — Emergency Department (HOSPITAL_COMMUNITY)
Admission: EM | Admit: 2024-06-12 | Discharge: 2024-06-12 | Disposition: A | Discharge status: Home / Self Care | Attending: Emergency Medicine | Admitting: Emergency Medicine

## 2024-06-12 ENCOUNTER — Encounter (HOSPITAL_COMMUNITY): Payer: Self-pay

## 2024-06-12 ENCOUNTER — Other Ambulatory Visit: Payer: Self-pay

## 2024-06-12 DIAGNOSIS — Z7982 Long term (current) use of aspirin: Secondary | ICD-10-CM | POA: Diagnosis not present

## 2024-06-12 DIAGNOSIS — I10 Essential (primary) hypertension: Secondary | ICD-10-CM | POA: Diagnosis not present

## 2024-06-12 DIAGNOSIS — R0789 Other chest pain: Secondary | ICD-10-CM | POA: Diagnosis present

## 2024-06-12 DIAGNOSIS — Z7984 Long term (current) use of oral hypoglycemic drugs: Secondary | ICD-10-CM | POA: Diagnosis not present

## 2024-06-12 DIAGNOSIS — E119 Type 2 diabetes mellitus without complications: Secondary | ICD-10-CM | POA: Diagnosis not present

## 2024-06-12 DIAGNOSIS — Z79899 Other long term (current) drug therapy: Secondary | ICD-10-CM | POA: Insufficient documentation

## 2024-06-12 DIAGNOSIS — I2510 Atherosclerotic heart disease of native coronary artery without angina pectoris: Secondary | ICD-10-CM | POA: Insufficient documentation

## 2024-06-12 DIAGNOSIS — Z794 Long term (current) use of insulin: Secondary | ICD-10-CM | POA: Diagnosis not present

## 2024-06-12 LAB — BASIC METABOLIC PANEL WITH GFR
Anion gap: 8 (ref 5–15)
BUN: 17 mg/dL (ref 8–23)
CO2: 27 mmol/L (ref 22–32)
Calcium: 9.5 mg/dL (ref 8.9–10.3)
Chloride: 104 mmol/L (ref 98–111)
Creatinine, Ser: 0.99 mg/dL (ref 0.44–1.00)
GFR, Estimated: 58 mL/min — ABNORMAL LOW
Glucose, Bld: 221 mg/dL — ABNORMAL HIGH (ref 70–99)
Potassium: 4.3 mmol/L (ref 3.5–5.1)
Sodium: 140 mmol/L (ref 135–145)

## 2024-06-12 LAB — CBC
HCT: 39.1 % (ref 36.0–46.0)
Hemoglobin: 12.5 g/dL (ref 12.0–15.0)
MCH: 27.7 pg (ref 26.0–34.0)
MCHC: 32 g/dL (ref 30.0–36.0)
MCV: 86.7 fL (ref 80.0–100.0)
Platelets: 249 K/uL (ref 150–400)
RBC: 4.51 MIL/uL (ref 3.87–5.11)
RDW: 13.8 % (ref 11.5–15.5)
WBC: 8.9 K/uL (ref 4.0–10.5)
nRBC: 0 % (ref 0.0–0.2)

## 2024-06-12 LAB — TROPONIN T, HIGH SENSITIVITY
Troponin T High Sensitivity: <15 ng/L (ref 0–19)
Troponin T High Sensitivity: <15 ng/L (ref 0–19)

## 2024-06-12 NOTE — Discharge Instructions (Addendum)
 Keep a log of your blood pressure.  Please follow-up with your cardiologist for optimal BP management.  Take the blood pressure log with you to that appointment.  Your cardiac workup is normal.  Labs are normal.  Please return to the ER if you have worsening chest pain, shortness of breath, pain radiating to your jaw, shoulder, or back, sweats or fainting. Otherwise see the Cardiologist or your primary care doctor as requested.

## 2024-06-12 NOTE — ED Provider Triage Note (Signed)
 Emergency Medicine Provider Triage Evaluation Note  Zenovia Rann , a 79 y.o. female  was evaluated in triage.  Pt complains of   79 y.o. female with a hx of hypertension, hyperlipidemia, diabetes, OSA not on CPAP, and CAD comes in with cc of low BP, chest tightness.  BP was low in the 80s. She felt dizzy and had blurry vision, which led to her checking her BP. She took SLnitro and aspirin , and chest pain, neck pain resolved.  Review of Systems  Positive: *** Negative: ***  Physical Exam  BP (!) 192/66   Pulse 65   Temp 98 F (36.7 C) (Oral)   Resp 16   SpO2 100%  Gen:   Awake, no distress  *** Resp:  Normal effort *** MSK:   Moves extremities without difficulty *** Other:  ***  Medical Decision Making  Medically screening exam initiated at 12:06 PM.  Appropriate orders placed.  Jaquelynn Bell was informed that the remainder of the evaluation will be completed by another provider, this initial triage assessment does not replace that evaluation, and the importance of remaining in the ED until their evaluation is complete.  ***

## 2024-06-12 NOTE — ED Triage Notes (Signed)
 Pt reporting that her blood pressure has been up and down and up and down. Pt reporting tightness in her epigastric reajon with translator and back pain.

## 2024-06-12 NOTE — ED Triage Notes (Signed)
 Pt lives at home alone. Pt has a home health aide. Ems called out for shortness of breath. Pt was more tachipnic but 100% on ra/ pt complaining of tightness in her epigastric area, left shoulder pain and back pain. Onset this morning at breakfast. Asa before ems . Nitrotab with fire. Pt did not take morning meds.   Ems vitals  170/80 Hr 60s  Cbg 248

## 2024-06-12 NOTE — ED Notes (Signed)
 Pt provided with discharge and follow up instructions, medications discussed, pt verbalized understanding. LDA removed, VSS, pt ambulatory out of ED w/ all paperwork and belongings in NAD.

## 2024-06-12 NOTE — ED Provider Notes (Signed)
 " [facility] Provider Note   CSN: [id number] Arrival date & time: 06/12/24  1151     Patient presents with: Chest Pain and Shortness of Breath   Carolyn Little is a 79 y.o. female.   HPI     Spanish translator was used for this encounter.  79 year old female with history of CAD status post PCI several years ago, accelerated hypertension, hyperlipidemia, diabetes comes in with chief complaint of low blood pressure and chest tightness.  Patient reports that she woke up this morning and was fine.  She started feeling dizzy and lightheaded soon after.  This occurs to her when her BP is low.  She rechecked her blood pressure, and noted that the systolic was in the 90s and then 80s.  She also started experiencing some chest tightness and discomfort over her right back region, which prompted her to call EMS.    Patient indicates that her doctors are trying to control her BP better.  She was put on new medications recently.  Subsequently, she has noted that her BP will drop occasionally.  When the BP drops, she is symptomatic.  When the blood pressure improves, she starts feeling better.  She typically does not get chest pain or tightness.  Today she did take aspirin  and nitroglycerin , and her chest pain started improving.  Currently she is chest pain-free.  In the last few days, patient has not had any exertional chest pain or shortness of breath.  Prior to Admission medications  Medication Sig Start Date End Date Taking? Authorizing Provider  ACCU-CHEK GUIDE test strip as directed as directed 01/30/21   [provider]  acetaminophen  (TYLENOL ) 325 MG tablet Take 325-650 mg by mouth every 6 (six) hours as needed.    [provider]  amLODipine  (NORVASC ) 2.5 MG tablet Take 1 tablet (2.5 mg total) by mouth daily. 02/05/24 05/05/24  Raford Riggs, MD  atorvastatin  (LIPITOR) 40 MG tablet Take 40 mg by mouth every other day.     [provider]  Blood Glucose Monitoring Suppl (ACCU-CHEK GUIDE) w/Device KIT  02/11/20   [provider]  Blood Glucose Monitoring Suppl (GLUCOCOM BLOOD GLUCOSE MONITOR) DEVI 1 Device by Misc.(Non-Drug; Combo Route) route as needed (to check blood glucose). 12/22/20   [provider]  Blood Glucose Monitoring Suppl (ONETOUCH VERIO FLEX SYSTEM) w/Device KIT 1 DEVICE BY MISC.(NON-DRUG COMBO ROUTE) ROUTE AS NEEDED (TO CHECK BLOOD GLUCOSE). 12/22/20   [provider]  carvedilol  (COREG ) 25 MG tablet TAKE 1 TABLET (25 MG TOTAL) BY MOUTH TWICE A DAY WITH MEALS 01/29/24   Janene Boer, PA  [facility] ASPIRIN  ADULT LOW DOSE 81 MG chewable tablet Chew 81 mg by mouth daily. 01/07/20   [provider]  diclofenac Sodium (VOLTAREN) 1 % GEL Apply 2 g topically 4 (four) times daily as needed. Patient not taking: Reported on 04/26/2024 01/28/20   [provider]  Dulaglutide (TRULICITY) 3 MG/0.5ML SOPN Inject 3 mg into the skin every 7 (seven) days.    [provider]  empagliflozin  (JARDIANCE ) 10 MG TABS tablet TAKE 1 TABLET BY MOUTH DAILY BEFORE BREAKFAST. 01/12/24   Vannie Reche RAMAN, NP  famotidine  (PEPCID ) 40 MG tablet Take 40 mg by mouth daily.    [provider]  glucose blood (ONETOUCH VERIO) test strip To monitor sugar 4 times per day 12/22/20   [provider]  Insulin  Degludec (TRESIBA) 100 UNIT/ML SOLN Inject 20 Units into the skin every evening.  Taking 40 units in the evening Patient taking differently: Inject 10 Units into the skin every evening. Taking 40 units in the evening    [provider]  Insulin  Pen Needle (BD PEN NEEDLE NANO U/F) 32G X 4 MM MISC Use once daily for Tresiba and once weekly for Trulicity 07/10/20   [provider]  lamoTRIgine  (LAMICTAL ) 150 MG tablet Take 1 tablet (150 mg total) by mouth daily. 02/02/24   [place] Raisin, NP  Lancets (ONETOUCH DELICA PLUS LANCET30G) MISC 1 DEVICE BY MISC.(NON-DRUG  COMBO ROUTE) ROUTE 4 TIMES DAILY. 03/07/21   [provider]  levETIRAcetam  (KEPPRA ) 500 MG tablet Take 1 tablet (500 mg total) by mouth every evening. 02/02/24   [place] Raisin, NP  metFORMIN (GLUCOPHAGE) 500 MG tablet Take 500 mg by mouth 2 (two) times daily. 12/06/19   [provider]  nitroGLYCERIN  (NITROSTAT ) 0.4 MG SL tablet Place 1 tablet (0.4 mg total) under the tongue every 5 (five) minutes as needed for chest pain. 01/02/23 04/26/24  Raford Riggs, MD  [facility] VERIO test strip 4 (four) times daily. 03/01/21   [provider]  SIMBRINZA 1-0.2 % SUSP Apply 1 drop to eye 2 (two) times daily. 05/23/22   [provider]  simvastatin  (ZOCOR ) 40 MG tablet Take 40 mg by mouth daily. 12/17/22   [provider]  TRESIBA FLEXTOUCH 200 UNIT/ML FlexTouch Pen SMARTSIG:40 Unit(s) SUB-Q Daily    [provider]  VASCEPA 1 g capsule Take 2 g by mouth daily. Patient taking differently: Take 1 g by mouth daily. 07/28/23   [provider]    Allergies: Atorvastatin  and Levaquin [levofloxacin]    Review of Systems  All other systems reviewed and are negative.   Updated Vital Signs BP (!) 172/76 (BP Location: Right Arm)   Pulse 68   Temp 98 F (36.7 C) (Oral)   Resp 18   Ht 4' 11 (1.499 m)   Wt 68 kg   SpO2 100%   BMI 30.28 kg/m   Physical Exam Vitals and nursing note reviewed.  Constitutional:      Appearance: She is well-developed.  HENT:     Head: Atraumatic.  Cardiovascular:     Rate and Rhythm: Normal rate.     Pulses:          Radial pulses are 2+ on the right side and 2+ on the left side.     Heart sounds: Normal heart sounds.  Pulmonary:     Effort: Pulmonary effort is normal.     Breath sounds: Normal breath sounds. No wheezing or rhonchi.  Musculoskeletal:     Cervical back: Normal range of motion and neck supple.  Skin:    General: Skin is warm and dry.  Neurological:     Mental Status: She is alert and  oriented to person, place, and time.     Motor: No weakness.     Comments: Patient has left-sided facial droop, EOMI, pupils are equal, reactive to light, visual fields are intact, upper and lower extremity gross sensory exam is normal, motor exam is normal.     (all labs ordered are listed, but only abnormal results are displayed) Labs Reviewed  BASIC METABOLIC PANEL WITH GFR - Abnormal; Notable for the following components:      Result Value   Glucose, Bld 221 (*)    GFR, Estimated 58 (*)    All other components within normal limits  CBC  TROPONIN T, HIGH SENSITIVITY  TROPONIN T,  HIGH SENSITIVITY    EKG: EKG Interpretation Date/Time:  Saturday June 12 2024 12:01:41 EST Ventricular Rate:  70 PR Interval:  184 QRS Duration:  92 QT Interval:  426 QTC Calculation: 460 R Axis:   29  Text Interpretation: Normal sinus rhythm ST & T wave abnormality, consider inferolateral ischemia Abnormal ECG When compared with ECG of 26-Apr-2024 11:32, PREVIOUS ECG IS PRESENT No significant change since last tracing Confirmed by Charlyn Sora ([id number]) on 06/12/2024 12:04:47 PM  Radiology: [facility] Chest Port 1 View Result Date: 06/12/2024 EXAM: 1 VIEW(S) XRAY OF THE CHEST 06/12/2024 12:22:50 PM COMPARISON: PA and lateral radiographs of the chest dated 07/16/2022. CLINICAL HISTORY: chest pain chest pain chest pain chest pain chest pain FINDINGS: LUNGS AND PLEURA: No focal pulmonary opacity. No pleural effusion. No pneumothorax. HEART AND MEDIASTINUM: Aortic atherosclerosis. No acute abnormality of the cardiac and mediastinal silhouettes. BONES AND SOFT TISSUES: Surgical anchors in right humeral head. No acute osseous abnormality. IMPRESSION: 1. No acute cardiopulmonary abnormality. 2. Aortic atherosclerosis. Electronically signed by: Evalene Coho MD 06/12/2024 12:51 PM EST RP Workstation: [id number]     Procedures   Medications Ordered in the ED - No data to display                                   Medical Decision Making Amount and/or Complexity of Data Reviewed Labs: ordered. Radiology: ordered.   This patient presents to the ED with chief complaint(s) of low blood pressure, chest discomfort, weakness and dizziness with pertinent past medical history of CAD, accelerated hypertension, diabetes.The complaint involves an extensive differential diagnosis and also carries with it a high risk of complications and morbidity.  Of note, patient's BP is noted to be elevated in the 180s in the ER.  She no longer has chest pain, dizziness or lightheadedness.  The differential diagnosis considered for this patient includes  ACS syndrome Aortic dissection Hypertensive emergency PE Medication side effect PUD / Gastritis / Esophagitis Esophageal spasm  Patient has reassuring cardiopulmonary exam.  Pulses are equal.  Neuroexam is nonfocal.  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  I did consider dissection in the differential, but patient has equal pulses and she is now pain-free.  It appears that she had pain when her BP was low, but now that the BP is high her pain is resolved.  This is not consistent with dissection, in the setting of patient having equal pulses and no severe murmur, I do not think we need to pursue dissection workup right now.  The initial plan is to get basic labs, place patient on cardiac monitoring and get troponins.  Initial EKG is unchanged.   Additional history obtained: Records reviewed primary care notes.  Patient also had a CT PE in 2024 which was negative.  Independent labs interpretation:  The following labs were independently interpreted: CBC, BMP, troponin x 2 normal.  Independent visualization and interpretation of imaging: - I independently visualized the following imaging with scope of interpretation limited to determining acute life threatening conditions related to emergency  care: X-ray of the chest, which revealed no evidence of pulmonary edema, pneumothorax.  Treatment and Reassessment: Patient reassessed.  She remains chest pain-free.  At this time she is stable for discharge.  The patient appears reasonably screened and/or stabilized for discharge and I doubt any other medical condition  or other [facility] requiring further screening, evaluation, or treatment in the ED at this time prior to discharge.   Results from the ER workup discussed with the patient face to face and all questions answered to the best of my ability. The patient is safe for discharge with strict return precautions.    Final diagnoses:  Chest tightness  Uncontrolled hypertension    ED Discharge Orders          Ordered    Ambulatory referral to Cardiology       Comments: If you have not heard from the Cardiology office within the next 72 hours please call [phone].   06/12/24 1545               Charlyn Sora, MD 06/12/24 1546  "

## 2024-07-05 ENCOUNTER — Ambulatory Visit (HOSPITAL_COMMUNITY): Admission: RE | Admit: 2024-07-05

## 2024-07-08 ENCOUNTER — Encounter (HOSPITAL_BASED_OUTPATIENT_CLINIC_OR_DEPARTMENT_OTHER): Admitting: Family

## 2025-02-07 ENCOUNTER — Ambulatory Visit: Admitting: Diagnostic Neuroimaging
